# Patient Record
Sex: Female | Born: 1950 | Race: Black or African American | Hispanic: No | Marital: Single | Smoking: Never smoker
Health system: Southern US, Community
[De-identification: ages and names within clinical notes are randomized; demographics above are authoritative.]

## PROBLEM LIST (undated history)

## (undated) DIAGNOSIS — E785 Hyperlipidemia, unspecified: Secondary | ICD-10-CM

## (undated) DIAGNOSIS — I639 Cerebral infarction, unspecified: Secondary | ICD-10-CM

## (undated) DIAGNOSIS — I5189 Other ill-defined heart diseases: Secondary | ICD-10-CM

## (undated) DIAGNOSIS — I10 Essential (primary) hypertension: Secondary | ICD-10-CM

## (undated) HISTORY — DX: Essential (primary) hypertension: I10

## (undated) HISTORY — PX: EYE SURGERY: SHX253

## (undated) HISTORY — DX: Other ill-defined heart diseases: I51.89

## (undated) HISTORY — DX: Hyperlipidemia, unspecified: E78.5

## (undated) HISTORY — DX: Cerebral infarction, unspecified: I63.9

---

## 1986-11-30 DIAGNOSIS — I10 Essential (primary) hypertension: Secondary | ICD-10-CM

## 1986-11-30 HISTORY — DX: Essential (primary) hypertension: I10

## 2005-03-15 ENCOUNTER — Inpatient Hospital Stay: Payer: Self-pay | Admitting: Internal Medicine

## 2005-04-06 ENCOUNTER — Ambulatory Visit: Payer: Self-pay | Admitting: Family Medicine

## 2012-03-24 ENCOUNTER — Emergency Department: Payer: Self-pay | Admitting: Internal Medicine

## 2012-03-24 LAB — CBC
HGB: 14.2 g/dL (ref 12.0–16.0)
MCH: 29.5 pg (ref 26.0–34.0)
MCHC: 32.4 g/dL (ref 32.0–36.0)
Platelet: 170 10*3/uL (ref 150–440)
RBC: 4.83 10*6/uL (ref 3.80–5.20)
RDW: 13.9 % (ref 11.5–14.5)

## 2012-03-24 LAB — URINALYSIS, COMPLETE
Bacteria: NONE SEEN
Bilirubin,UR: NEGATIVE
Leukocyte Esterase: NEGATIVE
Nitrite: NEGATIVE
Protein: 100
RBC,UR: <1 /HPF (ref 0–5)
Specific Gravity: 1.031 (ref 1.003–1.030)
Squamous Epithelial: 2
WBC UR: 1 /HPF (ref 0–5)

## 2012-03-24 LAB — COMPREHENSIVE METABOLIC PANEL
Albumin: 4.2 g/dL (ref 3.4–5.0)
Alkaline Phosphatase: 143 U/L — ABNORMAL HIGH (ref 50–136)
Anion Gap: 12 (ref 7–16)
Bilirubin,Total: 0.7 mg/dL (ref 0.2–1.0)
Calcium, Total: 9.1 mg/dL (ref 8.5–10.1)
Chloride: 99 mmol/L (ref 98–107)
Creatinine: 0.69 mg/dL (ref 0.60–1.30)
EGFR (African American): >60
SGOT(AST): 24 U/L (ref 15–37)
SGPT (ALT): 38 U/L
Sodium: 136 mmol/L (ref 136–145)
Total Protein: 8.9 g/dL — ABNORMAL HIGH (ref 6.4–8.2)

## 2012-03-24 LAB — LIPID PANEL
Cholesterol: 355 mg/dL — ABNORMAL HIGH (ref 0–200)
HDL Cholesterol: 64 mg/dL — ABNORMAL HIGH (ref 40–60)
Ldl Cholesterol, Calc: 266 mg/dL — ABNORMAL HIGH (ref 0–100)
Triglycerides: 123 mg/dL (ref 0–200)

## 2012-03-28 ENCOUNTER — Telehealth: Payer: Self-pay | Admitting: Internal Medicine

## 2012-03-28 NOTE — Telephone Encounter (Signed)
Absolutely push 2 15 minute slots together .  You are authorized to do that anytime this situation arises.

## 2012-03-28 NOTE — Telephone Encounter (Signed)
[id number] Pt called to get new pt appointment gave her 05/30/12.  Pt wanted to know if she could be seen sooner She went to [facility] er last week she has high BP/diabetics.Pt stated she had not been taking her meds for this She needs to have cataract surgery and wanted to be seen earlier Pt stated she has no primary care

## 2012-03-28 NOTE — Telephone Encounter (Signed)
Pt aware of appointment moved to 5/2

## 2012-04-01 ENCOUNTER — Ambulatory Visit (INDEPENDENT_AMBULATORY_CARE_PROVIDER_SITE_OTHER): Payer: PRIVATE HEALTH INSURANCE | Admitting: Internal Medicine

## 2012-04-01 ENCOUNTER — Encounter: Payer: Self-pay | Admitting: Internal Medicine

## 2012-04-01 VITALS — BP 200/100 | HR 79 | Temp 98.0°F | Resp 14 | Ht 63.5 in | Wt 123.8 lb

## 2012-04-01 DIAGNOSIS — I10 Essential (primary) hypertension: Secondary | ICD-10-CM

## 2012-04-01 DIAGNOSIS — E1129 Type 2 diabetes mellitus with other diabetic kidney complication: Secondary | ICD-10-CM

## 2012-04-01 DIAGNOSIS — N058 Unspecified nephritic syndrome with other morphologic changes: Secondary | ICD-10-CM

## 2012-04-01 DIAGNOSIS — E1121 Type 2 diabetes mellitus with diabetic nephropathy: Secondary | ICD-10-CM

## 2012-04-01 DIAGNOSIS — E785 Hyperlipidemia, unspecified: Secondary | ICD-10-CM | POA: Insufficient documentation

## 2012-04-01 LAB — HEMOGLOBIN A1C: Hgb A1c MFr Bld: 14.9 % — ABNORMAL HIGH (ref 4.6–6.5)

## 2012-04-01 MED ORDER — GLIPIZIDE 5 MG PO TABS: 5.0000 mg | ORAL_TABLET | Freq: Two times a day (BID) | ORAL | Status: DC

## 2012-04-01 MED ORDER — LOSARTAN POTASSIUM 100 MG PO TABS: 100.0000 mg | ORAL_TABLET | Freq: Every day | ORAL | Status: DC

## 2012-04-01 NOTE — Assessment & Plan Note (Addendum)
Chronic, uncontrolled per patient.  Has been taking hctz since April 25.,  Adding losartan since she has DM and proteinuria.   micardis given today in office.  Return in one week for repeat bp and labs.

## 2012-04-01 NOTE — Patient Instructions (Signed)
I am starting you on losartan for your blood pressure.  Continue to take HCTZ as well.,  I am resuming glipizide for your diabetes.  Continue metformin   Return in one week for labs and bp check.

## 2012-04-01 NOTE — Assessment & Plan Note (Addendum)
Uncontrolled. At presentation she was taking only metformin . We added back glipizide  prior to obtaining her A1c in which was done today and is now available at over 14.  We will have her return next week for initiation of insulin therapy. I will start her on twice daily 7030 dose to be determined once I see her fasting sugars are to fasting and postprandial sugars which have asked her to bring with her

## 2012-04-01 NOTE — Progress Notes (Signed)
Patient ID: Julie Dominguez, female   DOB: 06/28/1971, 61 y.o.   MRN: [id number]  Patient Active Problem List  Diagnoses  . Diabetes mellitus with nephropathy  . Hypertension  . Hyperlipidemia    Subjective:  CC:   Chief Complaint  Patient presents with  . New Patient    HPI:   Julie Dominguez a 61 y.o. female who presents  as a new patient,  referred by ER after April 25 visit for hypertension.  Her elevated blood pressure was discovered during preoperative evaluation for cataract surgery by [place] .  Prior medical care over 8 months by Dr. Roland Earl, who left [facility] without referring her to another physician there.  She was under treatment for htn by him but treatment was not aggressive bc he attributed her readings to white coat syndrome.  She has been checking her home bps using a home cuff an and they have been as high as 180s.  She was previously prescribed clonidine which made her very drowsy.  She has a history of diabetes mellitus is well controlled with metformin and hyperlipidemia controlled with Zocor. She has run out of all her medications  and is currently only taking  metformin,  HCTZ and zocor  since ER visit.   she is also overdue for  screening for cervical cancer and breast cancer .  Past Medical History  Diagnosis Date  . Diabetes mellitus 2007  . Hypertension 1988    uncontrolled   . Hyperlipidemia     History reviewed. No pertinent past surgical history.       The following portions of the patient's history were reviewed and updated as appropriate: Allergies, current medications, and problem list.    Review of Systems:   12 Pt  review of systems was negative except those addressed in the HPI,     History   Social History  . Marital Status: Single    Spouse Name: N/A    Number of Children: N/A  . Years of Education: N/A   Occupational History  . Not on file.   Social History Main Topics  . Smoking status: Never Smoker   . Smokeless  tobacco: Never Used  . Alcohol Use: No  . Drug Use: No  . Sexually Active: Not on file   Other Topics Concern  . Not on file   Social History Narrative  . No narrative on file    Objective:  BP 200/100  Pulse 79  Temp(Src) 98 F (36.7 C) (Oral)  Resp 14  Ht 5' 3.5" (1.613 m)  Wt 123 lb 12 oz (56.133 kg)  BMI 21.58 kg/m2  SpO2 99%  General appearance: alert, cooperative and appears stated age Ears: normal TM's and external ear canals both ears Throat: lips, mucosa, and tongue normal; teeth and gums normal Neck: no adenopathy, no carotid bruit, supple, symmetrical, trachea midline and thyroid not enlarged, symmetric, no tenderness/mass/nodules Back: symmetric, no curvature. ROM normal. No CVA tenderness. Lungs: clear to auscultation bilaterally Heart: regular rate and rhythm, S1, S2 normal, no murmur, click, rub or gallop Abdomen: soft, non-tender; bowel sounds normal; no masses,  no organomegaly Pulses: 2+ and symmetric Skin: Skin color, texture, turgor normal. No rashes or lesions Lymph nodes: Cervical, supraclavicular, and axillary nodes normal.  Assessment and Plan:  Hypertension Chronic, uncontrolled per patient.  Has been taking hctz since April 25.,  Adding losartan since she has DM and proteinuria.   micardis given today in office.  Return in one week for  repeat bp and labs.   Diabetes mellitus with nephropathy Uncontrolled. At presentation she was taking only metformin . We added back glipizide  prior to obtaining her A1c in which was done today and is now available at over 14.  We will have her return next week for initiation of insulin therapy. I will start her on twice daily 7030 dose to be determined once I see her fasting sugars are to fasting and postprandial sugars which have asked her to bring with her  Hyperlipidemia Goal LDL of 70. Patient taking Zocor in the past. She will return for fasting lipids.    Updated Medication List Outpatient Encounter  Prescriptions as of 04/01/2012  Medication Sig Dispense Refill  . hydrochlorothiazide (HYDRODIURIL) 25 MG tablet Take 25 mg by mouth daily.      . metFORMIN (GLUCOPHAGE) 500 MG tablet Take 500 mg by mouth 2 (two) times daily with a meal.      . simvastatin (ZOCOR) 40 MG tablet Take 40 mg by mouth every evening.      Marland Kitchen glipiZIDE (GLUCOTROL) 5 MG tablet Take 1 tablet (5 mg total) by mouth 2 (two) times daily before a meal.  60 tablet  3  . losartan (COZAAR) 100 MG tablet Take 1 tablet (100 mg total) by mouth daily.  90 tablet  3     Orders Placed This Encounter  Procedures  . Hemoglobin A1c    Return in about 1 week (around 04/08/2012).

## 2012-04-03 NOTE — Assessment & Plan Note (Signed)
Goal LDL of 70. Patient taking Zocor in the past. She will return for fasting lipids.

## 2012-04-08 ENCOUNTER — Ambulatory Visit (INDEPENDENT_AMBULATORY_CARE_PROVIDER_SITE_OTHER): Payer: PRIVATE HEALTH INSURANCE | Admitting: Internal Medicine

## 2012-04-08 ENCOUNTER — Ambulatory Visit: Payer: PRIVATE HEALTH INSURANCE | Admitting: Internal Medicine

## 2012-04-08 ENCOUNTER — Encounter: Payer: Self-pay | Admitting: Internal Medicine

## 2012-04-08 VITALS — BP 118/78 | HR 78 | Temp 98.6°F | Wt 127.2 lb

## 2012-04-08 DIAGNOSIS — I10 Essential (primary) hypertension: Secondary | ICD-10-CM

## 2012-04-08 DIAGNOSIS — E1121 Type 2 diabetes mellitus with diabetic nephropathy: Secondary | ICD-10-CM

## 2012-04-08 DIAGNOSIS — E785 Hyperlipidemia, unspecified: Secondary | ICD-10-CM

## 2012-04-08 DIAGNOSIS — N058 Unspecified nephritic syndrome with other morphologic changes: Secondary | ICD-10-CM

## 2012-04-08 DIAGNOSIS — E1129 Type 2 diabetes mellitus with other diabetic kidney complication: Secondary | ICD-10-CM

## 2012-04-08 NOTE — Patient Instructions (Addendum)
We are going to start using mixed insulin twice daily, before breakfast and before dinner.  Start with 10 units each time.  DO NOT TAKE INSULIN IF YOU ARE SKIPPING THAT MEAL  Check your blood sugars before breakfast and before your evening meal supper   If your sugar is < 150 , DO NOT TAKE ANY INSULIN .Marland Kitchen  Just use your pills.  If your blood sugar is between 150 and 200,  Give yourself 4 units of insulin  If your blood sugar is between 200 and 250, give yourself 6 units of insulin  If your sugar is between 250 and 300,  Give yourself 8 units of insulin   Over 300 give your self 10 units .Marland Kitchen   I want you to read about the low glycemic index foods and diabetes.    We will set you up with a diabetes education program at the hospital.   Low carb breads will advertise themselves as 'carb control":  [employer] make s Carb Balance whole wheat that is 6 net carbs  [employer] makes a low carb flatbread [employer] makes "Sandwhich thin' that is low carb

## 2012-04-10 ENCOUNTER — Encounter: Payer: Self-pay | Admitting: Internal Medicine

## 2012-04-10 NOTE — Assessment & Plan Note (Signed)
Simvastatin was resumed for goal LDL less than 70. She will return in 6 weeks for fasting lipids.

## 2012-04-10 NOTE — Assessment & Plan Note (Signed)
Uncontrolled with hemoglobin A1c of 14.9. She was instructed on how to self administer 7030 insulin today using the Flex pens. She will start with 10 units twice daily before breakfast and evening meal. She will titrate every 3 days by 3 units for blood sugars over 150 according to the flanks failed I've written out for her.

## 2012-04-10 NOTE — Progress Notes (Signed)
Patient ID: Julie Dominguez, female   DOB: 06/28/1971, 61 y.o.   MRN: [id number]  Patient Active Problem List  Diagnoses  . Diabetes mellitus with nephropathy  . Hypertension  . Hyperlipidemia    Subjective:  CC:   Chief Complaint  Patient presents with  . Follow-up    HPI:   Julie Dominguez a 61 y.o. female who presents for one-week followup of uncontrolled diabetes hypertension and hyperlipidemia. Patient was seen as a new patient last week after having several months without medications and not passing her preoperative preoperative evaluation with Dr. Thomasene Ripple.  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 She returns today for recheck on blood pressure as well as for instruction on insulin administration as her hemoglobin A1c was returned at 14.9. She states that her blood sugars have been ranging in the 300-350 range at home. She has started her Glucophage glipizide and metformin and has had no lows. She is very apprehensive about starting insulin and brings her daughter with her.   Past Medical History  Diagnosis Date  . Diabetes mellitus 2007  . Hypertension 1988    uncontrolled   . Hyperlipidemia     No past surgical history on file.       The following portions of the patient's history were reviewed and updated as appropriate: Allergies, current medications, and problem list.    Review of Systems:   12 Pt  review of systems was negative except those addressed in the HPI,     History   Social History  . Marital Status: Single    Spouse Name: N/A    Number of Children: N/A  . Years of Education: N/A   Occupational History  . Not on file.   Social History Main Topics  . Smoking status: Never Smoker   . Smokeless tobacco: Never Used  . Alcohol Use: No  . Drug Use: No  . Sexually Active: Not on file   Other Topics Concern  .  Not on file   Social History Narrative  . No narrative on file    Objective:  BP 118/78  Pulse 78  Temp(Src) 98.6 F (37 C) (Oral)  Wt 127 lb 4 oz (57.72 kg)  SpO2 98%  General appearance: alert, cooperative and appears stated age Ears: normal TM's and external ear canals both ears Throat: lips, mucosa, and tongue normal; teeth and gums normal Neck: no adenopathy, no carotid bruit, supple, symmetrical, trachea midline and thyroid not enlarged, symmetric, no tenderness/mass/nodules Back: symmetric, no curvature. ROM normal. No CVA tenderness. Lungs: clear to auscultation bilaterally Heart: regular rate and rhythm, S1, S2 normal, no murmur, click, rub or gallop Abdomen: soft, non-tender; bowel sounds normal; no masses,  no organomegaly Pulses: 2+ and symmetric Skin: Skin color, texture, turgor normal. No rashes or lesions Lymph nodes: Cervical, supraclavicular, and axillary nodes normal.  Assessment and Plan:  Hypertension Uncontrolled at last visit, now Well controlled on current medications.  Function assessed. No changes today.   Diabetes mellitus with nephropathy Uncontrolled with hemoglobin A1c of 14.9. She was instructed on how to self administer 7030 insulin today using the Flex pens. She will start with 10 units twice daily before breakfast and evening meal. She will titrate every 3 days by 3 units for blood sugars over 150 according to the flanks failed I've written out for her.  Hyperlipidemia Simvastatin was resumed for goal LDL  less than 70. She will return in 6 weeks for fasting lipids.    Updated Medication List Outpatient Encounter Prescriptions as of 04/08/2012  Medication Sig Dispense Refill  . glipiZIDE (GLUCOTROL) 5 MG tablet Take 1 tablet (5 mg total) by mouth 2 (two) times daily before a meal.  60 tablet  3  . hydrochlorothiazide (HYDRODIURIL) 25 MG tablet Take 25 mg by mouth daily.      Marland Kitchen losartan (COZAAR) 100 MG tablet Take 1 tablet (100 mg total) by  mouth daily.  90 tablet  3  . metFORMIN (GLUCOPHAGE) 500 MG tablet Take 500 mg by mouth 2 (two) times daily with a meal.      . simvastatin (ZOCOR) 40 MG tablet Take 40 mg by mouth every evening.         No orders of the defined types were placed in this encounter.    Return in about 2 weeks (around 04/22/2012).

## 2012-04-10 NOTE — Assessment & Plan Note (Addendum)
Uncontrolled at last visit, now Well controlled on current medications.  Function assessed. No changes today.

## 2012-04-14 ENCOUNTER — Other Ambulatory Visit: Payer: Self-pay | Admitting: Internal Medicine

## 2012-04-14 MED ORDER — INSULIN PEN NEEDLE 31G X 8 MM MISC: Status: DC

## 2012-04-22 ENCOUNTER — Emergency Department: Payer: Self-pay | Admitting: Emergency Medicine

## 2012-04-22 ENCOUNTER — Telehealth: Payer: Self-pay | Admitting: Internal Medicine

## 2012-04-22 ENCOUNTER — Encounter: Payer: Self-pay | Admitting: Internal Medicine

## 2012-04-22 ENCOUNTER — Ambulatory Visit (INDEPENDENT_AMBULATORY_CARE_PROVIDER_SITE_OTHER): Payer: PRIVATE HEALTH INSURANCE | Admitting: Internal Medicine

## 2012-04-22 VITALS — BP 260/120 | HR 76 | Temp 98.5°F | Resp 14 | Wt 129.5 lb

## 2012-04-22 DIAGNOSIS — N058 Unspecified nephritic syndrome with other morphologic changes: Secondary | ICD-10-CM

## 2012-04-22 DIAGNOSIS — I10 Essential (primary) hypertension: Secondary | ICD-10-CM

## 2012-04-22 DIAGNOSIS — E1129 Type 2 diabetes mellitus with other diabetic kidney complication: Secondary | ICD-10-CM

## 2012-04-22 DIAGNOSIS — E1121 Type 2 diabetes mellitus with diabetic nephropathy: Secondary | ICD-10-CM

## 2012-04-22 LAB — BASIC METABOLIC PANEL
CO2: 26 mEq/L (ref 19–32)
Calcium: 9.6 mg/dL (ref 8.4–10.5)
Creatinine, Ser: 0.6 mg/dL (ref 0.4–1.2)
Glucose, Bld: 145 mg/dL — ABNORMAL HIGH (ref 70–99)

## 2012-04-22 LAB — COMPREHENSIVE METABOLIC PANEL
Albumin: 4 g/dL (ref 3.4–5.0)
Alkaline Phosphatase: 156 U/L — ABNORMAL HIGH (ref 50–136)
Anion Gap: 10 (ref 7–16)
Bilirubin,Total: 0.6 mg/dL (ref 0.2–1.0)
Calcium, Total: 9.5 mg/dL (ref 8.5–10.1)
Chloride: 103 mmol/L (ref 98–107)
Co2: 27 mmol/L (ref 21–32)
Creatinine: 0.7 mg/dL (ref 0.60–1.30)
Glucose: 111 mg/dL — ABNORMAL HIGH (ref 65–99)
Osmolality: 281 (ref 275–301)
Potassium: 3.5 mmol/L (ref 3.5–5.1)
SGPT (ALT): 49 U/L
Total Protein: 9.1 g/dL — ABNORMAL HIGH (ref 6.4–8.2)

## 2012-04-22 LAB — CBC
HGB: 13.2 g/dL (ref 12.0–16.0)
MCH: 29 pg (ref 26.0–34.0)
WBC: 6.1 10*3/uL (ref 3.6–11.0)

## 2012-04-22 LAB — MICROALBUMIN / CREATININE URINE RATIO: Microalb, Ur: 10.5 mg/dL — ABNORMAL HIGH (ref 0.0–1.9)

## 2012-04-22 NOTE — Patient Instructions (Addendum)
We gave you clonidine 0.1 and micardis  For your blood pressure.  I am changing you insulin dose to 5 units in the morning and 8 units in the evening.

## 2012-04-22 NOTE — Progress Notes (Signed)
Patient ID: Julie Dominguez, female   DOB: May 30, 1951, 61 y.o.   MRN: [id number]   Patient Active Problem List  Diagnoses  . Diabetes mellitus with nephropathy  . Hypertension  . Hyperlipidemia  . Accelerated hypertension    Subjective:  CC:   Chief Complaint  Patient presents with  . Follow-up    2-wk CBGs    HPI:   Julie Dominguez a 61 y.o. female who presents for follow up on chronic issues including hypertension and diabetes.  She denies headaches, vision changes, chest pain , and shortness of breath..  Has been taking losartan and hctz daily for hypertension and her last dose was one hour ago.  Repeat manual blood pressure by me is  280/120.  She was given clonidine 0.1 mg prior to my evaluation,  And a dose of micardis 80/12.5 in office at 8 15 am. For management of her uncontrolled diabetes has been taking insulin,  novolog 70/30 according to a sliding scale twice daily.  Her blood sugars have been persistently elevated in the morning but less than 150 in the afternoon.  She has tried with mjoderate success to limit the starches in her diet but continues to have 3 or 4 servings daily.   Past Medical History  Diagnosis Date  . Diabetes mellitus 2007  . Hypertension 1988    uncontrolled   . Hyperlipidemia     History reviewed. No pertinent past surgical history.       The following portions of the patient's history were reviewed and updated as appropriate: Allergies, current medications, and problem list.    Review of Systems:   12 Pt  review of systems was negative except those addressed in the HPI,     History   Social History  . Marital Status: Single    Spouse Name: N/A    Number of Children: N/A  . Years of Education: N/A   Occupational History  . Not on file.   Social History Main Topics  . Smoking status: Never Smoker   . Smokeless tobacco: Never Used  . Alcohol Use: No  . Drug Use: No  . Sexually Active: Not on file   Other Topics Concern  .  Not on file   Social History Narrative  . No narrative on file    Objective:  BP 260/120  Pulse 76  Temp(Src) 98.5 F (36.9 C) (Oral)  Resp 14  Wt 129 lb 8 oz (58.741 kg)  SpO2 99%  General appearance: alert, cooperative and appears stated age Ears: normal TM's and external ear canals both ears Throat: lips, mucosa, and tongue normal; teeth and gums normal Neck: no adenopathy, no carotid bruit, supple, symmetrical, trachea midline and thyroid not enlarged, symmetric, no tenderness/mass/nodules Back: symmetric, no curvature. ROM normal. No CVA tenderness. Lungs: clear to auscultation bilaterally Heart: regular rate and rhythm, S1, S2 normal, no murmur, click, rub or gallop Abdomen: soft, non-tender; bowel sounds normal; no masses,  no organomegaly Pulses: 2+ and symmetric Skin: Skin color, texture, turgor normal. No rashes or lesions Lymph nodes: Cervical, supraclavicular, and axillary nodes normal.  Assessment and Plan:  Accelerated hypertension Despite dosing with 0.1 mg clonidine and 80 mg micardis, her bp remains 260/120 after 45 minutes with no symptoms of hypertensive urgency.  DDX includes bilateral renal artery stenosis and she will need renal artery dopplers as an outpatient for evaluation, but I have recommended that she go to the ER for observation and possible admission.  Review of ER records  suggests that they evaluated her for one hour and sent her home with her repeat systolic was  Improved to 200.  No EKG or chest x ray was done.   Cr and potassium are normal so bilateral RAS is unlikely. Urine microalb/cr ration was also noraml.  Will start amlodipine 5 mg to add to to losartan/hctz.    Diabetes mellitus with nephropathy improved with initiation of insulin.  Doses were change today to 5 units and 8 units respectively. Recent hgba1c was . 10 .Marland Kitchen  Repeat due in 2 months     Updated Medication List Outpatient Encounter Prescriptions as of 04/22/2012  Medication Sig  Dispense Refill  . glipiZIDE (GLUCOTROL) 5 MG tablet Take 1 tablet (5 mg total) by mouth 2 (two) times daily before a meal.  60 tablet  3  . hydrochlorothiazide (HYDRODIURIL) 25 MG tablet Take 25 mg by mouth daily.      Marland Kitchen ibuprofen (ADVIL,MOTRIN) 200 MG tablet Take 200 mg by mouth as needed.      . Insulin Pen Needle ([employer] PEN NEEDLES 31G) 31G X 8 MM MISC Inject insulin two times daily  100 each  5  . losartan (COZAAR) 100 MG tablet Take 1 tablet (100 mg total) by mouth daily.  90 tablet  3  . metFORMIN (GLUCOPHAGE) 500 MG tablet Take 500 mg by mouth 2 (two) times daily with a meal.      . Multiple Vitamins-Minerals (CENTRUM SILVER ULTRA WOMENS PO) Take by mouth daily.      . simvastatin (ZOCOR) 40 MG tablet Take 40 mg by mouth every evening.      Marland Kitchen amLODipine (NORVASC) 5 MG tablet Take 1 tablet (5 mg total) by mouth daily.  90 tablet  3     Orders Placed This Encounter  Procedures  . Microalbumin / creatinine urine ratio  . Basic metabolic panel  . Ambulatory referral to Vascular Surgery    Return in about 1 week (around 04/29/2012).

## 2012-04-22 NOTE — Telephone Encounter (Signed)
Refill on diabetic test strips .

## 2012-04-25 ENCOUNTER — Encounter: Payer: Self-pay | Admitting: Internal Medicine

## 2012-04-25 DIAGNOSIS — I10 Essential (primary) hypertension: Secondary | ICD-10-CM | POA: Insufficient documentation

## 2012-04-25 MED ORDER — AMLODIPINE BESYLATE 5 MG PO TABS: 5.0000 mg | ORAL_TABLET | Freq: Every day | ORAL | Status: DC

## 2012-04-25 NOTE — Assessment & Plan Note (Addendum)
Despite dosing with 0.1 mg clonidine and 80 mg micardis, her bp remains 260/120 after 45 minutes with no symptoms of hypertensive urgency.  DDX includes bilateral renal artery stenosis and she will need renal artery dopplers as an outpatient for evaluation, but I have recommended that she go to the ER for observation and possible admission.  Review of ER records suggests that they evaluated her for one hour and sent her home with her repeat systolic was  Improved to 200.  No EKG or chest x ray was done.   Cr and potassium are normal so bilateral RAS is unlikely. Urine microalb/cr ration was also noraml.  Will start amlodipine 5 mg to add to to losartan/hctz.

## 2012-04-25 NOTE — Assessment & Plan Note (Signed)
improved with initiation of insulin.  Doses were change today to 5 units and 8 units respectively. Recent hgba1c was . 10 .Marland Kitchen  Repeat due in 2 months

## 2012-04-26 ENCOUNTER — Telehealth: Payer: Self-pay | Admitting: Internal Medicine

## 2012-04-26 NOTE — Telephone Encounter (Signed)
I have filled out my part of the referral once provider gives it back to me I will then fax.

## 2012-04-26 NOTE — Telephone Encounter (Signed)
I tried calling patient but no voicemail has been set up yet.  I will try again.

## 2012-04-26 NOTE — Telephone Encounter (Signed)
Julie Dominguez was sent to ER after Friday's visit for bp 220/120 and was sent home from ER.  Please find out if they added any medications to her bp regimen (losartan and hctz)  If not, i called in amlodipine 5 mg one tablet daily I would like her to add thi s to her other 2 and return on Friday for BP check after having taken all 3 daily in the morning for the week

## 2012-04-27 MED ORDER — BLOOD GLUCOSE TEST VI STRP: ORAL_STRIP | Status: DC

## 2012-04-27 NOTE — Telephone Encounter (Signed)
Patient notified, she stated they did not add a new medication to her regimen.  She will start the amlodipine and she has a follow up tomorrow.

## 2012-04-28 ENCOUNTER — Encounter: Payer: Self-pay | Admitting: Internal Medicine

## 2012-04-28 ENCOUNTER — Ambulatory Visit (INDEPENDENT_AMBULATORY_CARE_PROVIDER_SITE_OTHER): Payer: PRIVATE HEALTH INSURANCE | Admitting: Internal Medicine

## 2012-04-28 VITALS — BP 236/100 | HR 80 | Temp 98.1°F | Resp 16 | Wt 129.5 lb

## 2012-04-28 DIAGNOSIS — I10 Essential (primary) hypertension: Secondary | ICD-10-CM

## 2012-04-28 DIAGNOSIS — E1165 Type 2 diabetes mellitus with hyperglycemia: Secondary | ICD-10-CM

## 2012-04-28 DIAGNOSIS — IMO0002 Reserved for concepts with insufficient information to code with codable children: Secondary | ICD-10-CM

## 2012-04-28 DIAGNOSIS — IMO0001 Reserved for inherently not codable concepts without codable children: Secondary | ICD-10-CM

## 2012-04-28 MED ORDER — BLOOD GLUCOSE TEST VI STRP: ORAL_STRIP | Status: DC

## 2012-04-28 MED ORDER — SPIRONOLACTONE 50 MG PO TABS: 50.0000 mg | ORAL_TABLET | Freq: Every day | ORAL | Status: DC

## 2012-04-28 NOTE — Patient Instructions (Signed)
continue the amlodipine and the losartan. Suspend the  hctz and start spironolactone daily in the morning 50 mg .  Check your pressures daily in the late morning ,  Return in one week with your blood pressure machine.   Keep dong yuor current regimen for diabetes,

## 2012-04-28 NOTE — Progress Notes (Signed)
Patient ID: Julie Dominguez, female   DOB: [birth year], 61 y.o.   MRN: [id number] Patient Active Problem List  Diagnoses  . Hypertension  . Hyperlipidemia  . Accelerated hypertension  . Diabetes mellitus type II, uncontrolled    Subjective:  CC:   Chief Complaint  Patient presents with  . Follow-up    ER    HPI:   Julie Dominguez a 61 y.o. female who presents fof follow up on hypertension and diabetes.  One week ago she was sent to ER for asymptomatic uncontrolled htn and was discharged home without intervention.  I called in amlodipine 5 mg daily ,  Which she has added to her previous regimen,.  Home bps have been around 165 to 80.  She states that she has always had elevations due to white coat hypertension. She has no  Headache, blurred vision, chest pain or orthopnea and is tolerating her meds without side effects.  She has also been following the insulin and oral medication  regimen outlined at last visit for  her uncontrolled diabetes and has noticed an improvement in all blood sugars .     Past Medical History  Diagnosis Date  . Diabetes mellitus 2007  . Hypertension 1988    uncontrolled   . Hyperlipidemia     History reviewed. No pertinent past surgical history.       The following portions of the patient's history were reviewed and updated as appropriate: Allergies, current medications, and problem list.    Review of Systems:   12 Pt  review of systems was negative except those addressed in the HPI,     History   Social History  . Marital Status: Single    Spouse Name: N/A    Number of Children: N/A  . Years of Education: N/A   Occupational History  . Not on file.   Social History Main Topics  . Smoking status: Never Smoker   . Smokeless tobacco: Never Used  . Alcohol Use: No  . Drug Use: No  . Sexually Active: Not on file   Other Topics Concern  . Not on file   Social History Narrative  . No narrative on file    Objective:  BP 236/100   Pulse 80  Temp(Src) 98.1 F (36.7 C) (Oral)  Resp 16  Wt 129 lb 8 oz (58.741 kg)  SpO2 97%  General appearance: alert, cooperative and appears stated age Ears: normal TM's and external ear canals both ears Throat: lips, mucosa, and tongue normal; teeth and gums normal Neck: no adenopathy, no carotid bruit, supple, symmetrical, trachea midline and thyroid not enlarged, symmetric, no tenderness/mass/nodules Back: symmetric, no curvature. ROM normal. No CVA tenderness. Lungs: clear to auscultation bilaterally Heart: regular rate and rhythm, S1, S2 normal, no murmur, click, rub or gallop Abdomen: soft, non-tender; bowel sounds normal; no masses,  no organomegaly Pulses: 2+ and symmetric Skin: Skin color, texture, turgor normal. No rashes or lesions Lymph nodes: Cervical, supraclavicular, and axillary nodes normal.  Assessment and Plan:  Hypertension Again uncontrolled , despite addition of amlodipine.  Per patient, her home measurements are better.  Stopping hctz and addind spironolactone to losartan, amlodipine and metoprolol.  Return in one week with bp cuff.  She has normal renal function nad no proteinuria by recent testing.  Adding renin/alsosterone testing,  Will need repeat bmet at followup to check potassium level.   Diabetes mellitus type II, uncontrolled improving control with oral medications and sliding scale insulin .,no changes today.  Updated Medication List Outpatient Encounter Prescriptions as of 04/28/2012  Medication Sig Dispense Refill  . amLODipine (NORVASC) 5 MG tablet Take 1 tablet (5 mg total) by mouth daily.  90 tablet  3  . glipiZIDE (GLUCOTROL) 5 MG tablet Take 1 tablet (5 mg total) by mouth 2 (two) times daily before a meal.  60 tablet  3  . Glucose Blood (BLOOD GLUCOSE TEST STRIPS) STRP Test blood sugar two times daily.  100 each  6  . hydrochlorothiazide (HYDRODIURIL) 25 MG tablet Take 25 mg by mouth daily.      Marland Kitchen ibuprofen (ADVIL,MOTRIN) 200 MG tablet  Take 200 mg by mouth as needed.      . Insulin Pen Needle ([employer] PEN NEEDLES 31G) 31G X 8 MM MISC Inject insulin two times daily  100 each  5  . losartan (COZAAR) 100 MG tablet Take 1 tablet (100 mg total) by mouth daily.  90 tablet  3  . metFORMIN (GLUCOPHAGE) 500 MG tablet Take 500 mg by mouth 2 (two) times daily with a meal.      . simvastatin (ZOCOR) 40 MG tablet Take 40 mg by mouth every evening.      Marland Kitchen spironolactone (ALDACTONE) 50 MG tablet Take 1 tablet (50 mg total) by mouth daily.  30 tablet  1  . DISCONTD: Glucose Blood (BLOOD GLUCOSE TEST STRIPS) STRP Test blood sugar two times daily.  100 each  6  . DISCONTD: Multiple Vitamins-Minerals (CENTRUM SILVER ULTRA WOMENS PO) Take by mouth daily.         Orders Placed This Encounter  Procedures  . Aldosterone + renin activity w/ ratio    No Follow-up on file.

## 2012-04-29 NOTE — Telephone Encounter (Signed)
I have faxed referral.

## 2012-05-01 ENCOUNTER — Encounter: Payer: Self-pay | Admitting: Internal Medicine

## 2012-05-01 DIAGNOSIS — E1129 Type 2 diabetes mellitus with other diabetic kidney complication: Secondary | ICD-10-CM | POA: Insufficient documentation

## 2012-05-01 DIAGNOSIS — IMO0002 Reserved for concepts with insufficient information to code with codable children: Secondary | ICD-10-CM | POA: Insufficient documentation

## 2012-05-01 NOTE — Assessment & Plan Note (Signed)
improving control with oral medications and sliding scale insulin .,no changes today.

## 2012-05-01 NOTE — Assessment & Plan Note (Addendum)
Again uncontrolled , despite addition of amlodipine.  Per patient, her home measurements are better.  Stopping hctz and addind spironolactone to losartan, amlodipine and metoprolol.  Return in one week with bp cuff.  She has normal renal function nad no proteinuria by recent testing.  Adding renin/alsosterone testing,  Will need repeat bmet at followup to check potassium level.

## 2012-05-04 ENCOUNTER — Ambulatory Visit (INDEPENDENT_AMBULATORY_CARE_PROVIDER_SITE_OTHER): Payer: PRIVATE HEALTH INSURANCE | Admitting: Internal Medicine

## 2012-05-04 ENCOUNTER — Encounter: Payer: Self-pay | Admitting: Internal Medicine

## 2012-05-04 VITALS — BP 220/108 | HR 64 | Temp 97.9°F | Resp 14 | Wt 131.0 lb

## 2012-05-04 DIAGNOSIS — E1129 Type 2 diabetes mellitus with other diabetic kidney complication: Secondary | ICD-10-CM

## 2012-05-04 DIAGNOSIS — N058 Unspecified nephritic syndrome with other morphologic changes: Secondary | ICD-10-CM

## 2012-05-04 DIAGNOSIS — I10 Essential (primary) hypertension: Secondary | ICD-10-CM

## 2012-05-04 DIAGNOSIS — IMO0002 Reserved for concepts with insufficient information to code with codable children: Secondary | ICD-10-CM

## 2012-05-04 DIAGNOSIS — E1165 Type 2 diabetes mellitus with hyperglycemia: Secondary | ICD-10-CM

## 2012-05-04 DIAGNOSIS — F411 Generalized anxiety disorder: Secondary | ICD-10-CM

## 2012-05-04 DIAGNOSIS — E1121 Type 2 diabetes mellitus with diabetic nephropathy: Secondary | ICD-10-CM

## 2012-05-04 DIAGNOSIS — IMO0001 Reserved for inherently not codable concepts without codable children: Secondary | ICD-10-CM

## 2012-05-04 DIAGNOSIS — H28 Cataract in diseases classified elsewhere: Secondary | ICD-10-CM

## 2012-05-04 MED ORDER — AMLODIPINE BESYLATE 5 MG PO TABS: 10.0000 mg | ORAL_TABLET | Freq: Every day | ORAL | Status: DC

## 2012-05-04 MED ORDER — DIAZEPAM 5 MG PO TABS: 5.0000 mg | ORAL_TABLET | Freq: Two times a day (BID) | ORAL | Status: AC | PRN

## 2012-05-04 MED ORDER — GLIPIZIDE 5 MG PO TABS: ORAL_TABLET | ORAL | Status: DC

## 2012-05-04 MED ORDER — SERTRALINE HCL 50 MG PO TABS: 50.0000 mg | ORAL_TABLET | Freq: Every day | ORAL | Status: DC

## 2012-05-04 NOTE — Patient Instructions (Signed)
We are going to try suspending the insulin for 2 weeks and increasing the glipizide to  5 mg in the morning and 10 mg before dinner  Continue the metformin 1 tablet twice daily   We are adding valium for your nerves.   2mg  tablet twice daily as a temporary way to manage anxiety  We are going to start sertraline at bedtime 50 mg . Take with meal or at bedtime with other medication   I am going to increase the amlodipine to 10 mg daily in the morning

## 2012-05-04 NOTE — Progress Notes (Signed)
Patient ID: Julie Dominguez, female   DOB: December 20, 1950, 61 y.o.   MRN: [id number]  Patient Active Problem List  Diagnoses  . Hypertension  . Hyperlipidemia  . Accelerated hypertension  . Diabetes mellitus type II, uncontrolled  . GAD (generalized anxiety disorder)  . Cataract associated with another disorder    Subjective:  CC:   Chief Complaint  Patient presents with  . Follow-up    HPI:   Julie Dominguez is a 61 y.o. female who presents for follow up on uncontrolled hypertension and uncontrolled diabetes.  She has been checking her bp at home daily and home readings have been 0000000 to [id number] systolic.  She  thinks that anxiety is driving it up because the more she checks it the higher it gets. She does report difficulty  Relaxing, and frequent insomnia.  Denies any physical ore emotional abuse at home. No particular stressors other than financial. She is worried about her eyesight, which she feels is getting worse, secondary to cataracts, and is eager to have the cataract surgery that Dr. Thomasene Ripple has postoned bc of her hypertension.   Regarding her diabetes,  She has been taking glipizde and metfrormin as directed.  She has had daily use of the sliding scale using 70/30 insulin for cbgs > 150 premeal.  Morning sugars have been as low as 82,  As high as 192. Evening blood sugars have been 124 , 94, 164, 150,  113.  She has not kept a food diary.  She averages about 5 units a day of the insulin.    Past Medical History  Diagnosis Date  . Diabetes mellitus 2007  . Hypertension 1988    uncontrolled   . Hyperlipidemia     History reviewed. No pertinent past surgical history.       The following portions of the patient's history were reviewed and updated as appropriate: Allergies, current medications, and problem list.    Review of Systems:   12 Pt  review of systems was negative except those addressed in the HPI,     History   Social History  . Marital Status: Single   Spouse Name: N/A    Number of Children: N/A  . Years of Education: N/A   Occupational History  . Not on file.   Social History Main Topics  . Smoking status: Never Smoker   . Smokeless tobacco: Never Used  . Alcohol Use: No  . Drug Use: No  . Sexually Active: Not on file   Other Topics Concern  . Not on file   Social History Narrative  . No narrative on file    Objective:  BP 220/108  Pulse 64  Temp(Src) 97.9 F (36.6 C) (Oral)  Resp 14  Wt 131 lb (59.421 kg)  SpO2 97%  General appearance: alert, cooperative and appears stated age Ears: normal TM's and external ear canals both ears Throat: lips, mucosa, and tongue normal; teeth and gums normal Neck: no adenopathy, no carotid bruit, supple, symmetrical, trachea midline and thyroid not enlarged, symmetric, no tenderness/mass/nodules Back: symmetric, no curvature. ROM normal. No CVA tenderness. Lungs: clear to auscultation bilaterally Heart: regular rate and rhythm, S1, S2 normal, no murmur, click, rub or gallop Abdomen: soft, non-tender; bowel sounds normal; no masses,  no organomegaly Pulses: 2+ and symmetric Skin: Skin color, texture, turgor normal. No rashes or lesions Lymph nodes: Cervical, supraclavicular, and axillary nodes normal.  Assessment and Plan:  Diabetes mellitus type II, uncontrolled Improving control..  We are going to  try suspending the insulin for 2 weeks and increasing the glipizide to  5 mg in the morning and 10 mg before dinner.  She will continue the metformin 1 tablet twice daily   Accelerated hypertension Her bp remains elevated, without signs of end organ damage. I am going to increase the amlodipine to 10 mg daily in the morning  We are adding valium for her anxiety and sertraline at bedtime for her uncontrolled anxiety, 2 mg tablet twice daily as a temporary way to manage anxiety while the sertraline is started .      GAD (generalized anxiety disorder) Trail of sertraline 50 mg qhs and  bid valium low dose as a temporizing measure  Cataract associated with another disorder She is awaiting surgery which has been postponed due to uncontrolled HTN and DM.  She reports worsening vision today.  I am not sure what level of control Dr. Thomasene Ripple is looking for but hopefully by next week we will have things under sufficient control     Updated Medication List Outpatient Encounter Prescriptions as of 05/04/2012  Medication Sig Dispense Refill  . amLODipine (NORVASC) 5 MG tablet Take 2 tablets (10 mg total) by mouth daily.  90 tablet  0  . glipiZIDE (GLUCOTROL) 5 MG tablet 5 mg in the morning and 10 mg in the evening before meals  90 tablet  3  . Glucose Blood (BLOOD GLUCOSE TEST STRIPS) STRP Test blood sugar two times daily.  100 each  6  . ibuprofen (ADVIL,MOTRIN) 200 MG tablet Take 200 mg by mouth as needed.      . Insulin Pen Needle ([employer] PEN NEEDLES 31G) 31G X 8 MM MISC Inject insulin two times daily  100 each  5  . losartan (COZAAR) 100 MG tablet Take 1 tablet (100 mg total) by mouth daily.  90 tablet  3  . metFORMIN (GLUCOPHAGE) 500 MG tablet Take 500 mg by mouth 2 (two) times daily with a meal.      . simvastatin (ZOCOR) 40 MG tablet Take 40 mg by mouth every evening.      Marland Kitchen spironolactone (ALDACTONE) 50 MG tablet Take 1 tablet (50 mg total) by mouth daily.  30 tablet  1  . DISCONTD: amLODipine (NORVASC) 5 MG tablet Take 1 tablet (5 mg total) by mouth daily.  90 tablet  3  . DISCONTD: glipiZIDE (GLUCOTROL) 5 MG tablet Take 1 tablet (5 mg total) by mouth 2 (two) times daily before a meal.  60 tablet  3  . diazepam (VALIUM) 5 MG tablet Take 1 tablet (5 mg total) by mouth every 12 (twelve) hours as needed for anxiety.  60 tablet  1  . sertraline (ZOLOFT) 50 MG tablet Take 1 tablet (50 mg total) by mouth daily.  30 tablet  3  . DISCONTD: hydrochlorothiazide (HYDRODIURIL) 25 MG tablet Take 25 mg by mouth daily.         No orders of the defined types were placed in this  encounter.    No Follow-up on file.

## 2012-05-05 LAB — ALDOSTERONE + RENIN ACTIVITY W/ RATIO
ALDO / PRA Ratio: 3.3 Ratio (ref 0.9–28.9)
PRA LC/MS/MS: 0.91 ng/mL/h (ref 0.25–5.82)

## 2012-05-08 ENCOUNTER — Encounter: Payer: Self-pay | Admitting: Internal Medicine

## 2012-05-08 DIAGNOSIS — H28 Cataract in diseases classified elsewhere: Secondary | ICD-10-CM | POA: Insufficient documentation

## 2012-05-08 DIAGNOSIS — F411 Generalized anxiety disorder: Secondary | ICD-10-CM | POA: Insufficient documentation

## 2012-05-08 NOTE — Assessment & Plan Note (Signed)
Her bp remains elevated, without signs of end organ damage. I am going to increase the amlodipine to 10 mg daily in the morning  We are adding valium for her anxiety and sertraline at bedtime for her uncontrolled anxiety, 2 mg tablet twice daily as a temporary way to manage anxiety while the sertraline is started .

## 2012-05-08 NOTE — Assessment & Plan Note (Signed)
Trail of sertraline 50 mg qhs and bid valium low dose as a temporizing measure

## 2012-05-08 NOTE — Assessment & Plan Note (Signed)
Improving control..  We are going to try suspending the insulin for 2 weeks and increasing the glipizide to  5 mg in the morning and 10 mg before dinner.  She will continue the metformin 1 tablet twice daily

## 2012-05-08 NOTE — Assessment & Plan Note (Signed)
She is awaiting surgery which has been postponed due to uncontrolled HTN and DM.  She reports worsening vision today.  I am not sure what level of control Dr. Thomasene Ripple is looking for but hopefully by next week we will have things under sufficient control

## 2012-05-10 ENCOUNTER — Other Ambulatory Visit: Payer: PRIVATE HEALTH INSURANCE | Admitting: *Deleted

## 2012-05-10 ENCOUNTER — Telehealth: Payer: Self-pay | Admitting: Internal Medicine

## 2012-05-10 ENCOUNTER — Telehealth: Payer: Self-pay | Admitting: *Deleted

## 2012-05-10 ENCOUNTER — Other Ambulatory Visit: Payer: Self-pay | Admitting: Internal Medicine

## 2012-05-10 DIAGNOSIS — I10 Essential (primary) hypertension: Secondary | ICD-10-CM

## 2012-05-10 MED ORDER — CLONIDINE HCL 0.1 MG PO TABS: 0.1000 mg | ORAL_TABLET | Freq: Two times a day (BID) | ORAL | Status: DC

## 2012-05-10 NOTE — Telephone Encounter (Signed)
i cannot find an order for the 24 hr ambulatory blood pressure study.

## 2012-05-10 NOTE — Telephone Encounter (Signed)
SCHEDULED THE PATIENT TO HAVE AN ECHO AND AN AMBULATORY BLOOD PRESSURE MONITOR ON Thursday 6-13 @ 9:30 AT [facility].  THEY NEED YOU TO PUT AN ORDER IN EPIC FOR THESE PROCEDURES.  CANT BE DONE FROM THE REFERRAL   THANKS

## 2012-05-10 NOTE — Telephone Encounter (Signed)
Patient notified, her appt with the cardiologist is Thursday.

## 2012-05-10 NOTE — Telephone Encounter (Signed)
Patient came in today for blood pressure check. Her BP was 240/110. Pulse was 90. She feels fine and says that when she is at home it's never this high.

## 2012-05-10 NOTE — Telephone Encounter (Signed)
I will need to add clonidine 0.1 mg twice daily to her current regimen of amlodipine 10 mg daily,. spironolactone 50 mg daily,  And losartan 100 mg daily.  rx sent to pharmacy in chart.  And I am putting in a referral to cardiology for an ambulatory bp monitor.

## 2012-05-12 ENCOUNTER — Other Ambulatory Visit (HOSPITAL_COMMUNITY): Payer: Self-pay | Admitting: Internal Medicine

## 2012-05-12 ENCOUNTER — Ambulatory Visit (HOSPITAL_COMMUNITY): Payer: PRIVATE HEALTH INSURANCE | Attending: Cardiology | Admitting: Radiology

## 2012-05-12 ENCOUNTER — Encounter (INDEPENDENT_AMBULATORY_CARE_PROVIDER_SITE_OTHER): Payer: PRIVATE HEALTH INSURANCE

## 2012-05-12 DIAGNOSIS — E785 Hyperlipidemia, unspecified: Secondary | ICD-10-CM | POA: Insufficient documentation

## 2012-05-12 DIAGNOSIS — R030 Elevated blood-pressure reading, without diagnosis of hypertension: Secondary | ICD-10-CM

## 2012-05-12 DIAGNOSIS — I10 Essential (primary) hypertension: Secondary | ICD-10-CM | POA: Insufficient documentation

## 2012-05-12 DIAGNOSIS — I369 Nonrheumatic tricuspid valve disorder, unspecified: Secondary | ICD-10-CM

## 2012-05-12 DIAGNOSIS — E119 Type 2 diabetes mellitus without complications: Secondary | ICD-10-CM | POA: Insufficient documentation

## 2012-05-12 DIAGNOSIS — I359 Nonrheumatic aortic valve disorder, unspecified: Secondary | ICD-10-CM | POA: Insufficient documentation

## 2012-05-12 NOTE — Progress Notes (Signed)
Echocardiogram performed.  

## 2012-05-24 ENCOUNTER — Telehealth: Payer: Self-pay | Admitting: Internal Medicine

## 2012-05-24 DIAGNOSIS — I10 Essential (primary) hypertension: Secondary | ICD-10-CM

## 2012-05-24 DIAGNOSIS — E1121 Type 2 diabetes mellitus with diabetic nephropathy: Secondary | ICD-10-CM

## 2012-05-24 MED ORDER — AMLODIPINE BESYLATE 5 MG PO TABS: 10.0000 mg | ORAL_TABLET | Freq: Every day | ORAL | Status: DC

## 2012-05-24 MED ORDER — METFORMIN HCL 500 MG PO TABS: 500.0000 mg | ORAL_TABLET | Freq: Two times a day (BID) | ORAL | Status: DC

## 2012-05-24 MED ORDER — GLIPIZIDE 5 MG PO TABS: ORAL_TABLET | ORAL | Status: DC

## 2012-05-24 NOTE — Telephone Encounter (Signed)
Rx's sent to pharmacy.  

## 2012-05-24 NOTE — Telephone Encounter (Signed)
Refill on Glipizide 5 mg 1 in the am and 2 in the pm. Metformin 500 mg and Amlodipine 5 mg .

## 2012-05-30 ENCOUNTER — Ambulatory Visit: Payer: Self-pay | Admitting: Internal Medicine

## 2012-06-06 ENCOUNTER — Telehealth: Payer: Self-pay | Admitting: Internal Medicine

## 2012-06-06 NOTE — Telephone Encounter (Signed)
Caryl Pina,  Please confirm that you faxed this medical clearance to Eye [facility].  I have signed it

## 2012-06-06 NOTE — Telephone Encounter (Signed)
Eye doctor office needs medical clearance for surgery please fax [phone]

## 2012-06-07 NOTE — Telephone Encounter (Signed)
Yes, the medical clearance has been faxed back to [facility].

## 2012-06-10 ENCOUNTER — Other Ambulatory Visit: Payer: Self-pay | Admitting: Internal Medicine

## 2012-06-28 ENCOUNTER — Ambulatory Visit: Payer: Self-pay | Admitting: Ophthalmology

## 2012-06-28 LAB — POTASSIUM: Potassium: 4.1 mmol/L (ref 3.5–5.1)

## 2012-07-02 LAB — HM DIABETES EYE EXAM

## 2012-07-11 ENCOUNTER — Ambulatory Visit: Payer: Self-pay | Admitting: Ophthalmology

## 2012-07-17 ENCOUNTER — Other Ambulatory Visit: Payer: Self-pay | Admitting: Internal Medicine

## 2012-07-18 ENCOUNTER — Other Ambulatory Visit: Payer: Self-pay | Admitting: Internal Medicine

## 2012-07-18 MED ORDER — SIMVASTATIN 40 MG PO TABS: 40.0000 mg | ORAL_TABLET | Freq: Every evening | ORAL | Status: DC

## 2012-07-18 MED ORDER — HYDROCHLOROTHIAZIDE 25 MG PO TABS: 25.0000 mg | ORAL_TABLET | Freq: Every day | ORAL | Status: DC

## 2012-07-18 MED ORDER — METFORMIN HCL 500 MG PO TABS: 500.0000 mg | ORAL_TABLET | Freq: Two times a day (BID) | ORAL | Status: DC

## 2012-07-21 ENCOUNTER — Other Ambulatory Visit: Payer: Self-pay | Admitting: Internal Medicine

## 2012-07-21 MED ORDER — SIMVASTATIN 40 MG PO TABS: 40.0000 mg | ORAL_TABLET | Freq: Every evening | ORAL | Status: DC

## 2012-07-22 ENCOUNTER — Other Ambulatory Visit: Payer: Self-pay | Admitting: Internal Medicine

## 2012-09-01 ENCOUNTER — Ambulatory Visit: Payer: Self-pay | Admitting: Ophthalmology

## 2012-09-01 LAB — POTASSIUM: Potassium: 4 mmol/L (ref 3.5–5.1)

## 2012-09-08 ENCOUNTER — Other Ambulatory Visit: Payer: Self-pay | Admitting: Internal Medicine

## 2012-09-12 ENCOUNTER — Ambulatory Visit: Payer: Self-pay | Admitting: Ophthalmology

## 2012-10-15 ENCOUNTER — Other Ambulatory Visit: Payer: Self-pay | Admitting: Internal Medicine

## 2012-10-17 NOTE — Telephone Encounter (Signed)
Reordered patients Zoloft per Epic

## 2012-11-14 ENCOUNTER — Other Ambulatory Visit: Payer: Self-pay | Admitting: Internal Medicine

## 2012-11-15 ENCOUNTER — Other Ambulatory Visit: Payer: Self-pay

## 2012-11-15 DIAGNOSIS — E1121 Type 2 diabetes mellitus with diabetic nephropathy: Secondary | ICD-10-CM

## 2012-11-15 MED ORDER — GLIPIZIDE 5 MG PO TABS: ORAL_TABLET | ORAL | Status: DC

## 2012-11-15 NOTE — Telephone Encounter (Signed)
Glipizide 5 mg # 90 5 R sent to [facility]

## 2012-12-21 ENCOUNTER — Other Ambulatory Visit: Payer: Self-pay | Admitting: Internal Medicine

## 2012-12-21 NOTE — Telephone Encounter (Signed)
Ok to fill? Please route back to your CMA

## 2013-01-21 ENCOUNTER — Other Ambulatory Visit: Payer: Self-pay | Admitting: Internal Medicine

## 2013-01-28 ENCOUNTER — Other Ambulatory Visit: Payer: Self-pay | Admitting: Internal Medicine

## 2013-02-04 ENCOUNTER — Telehealth: Payer: Self-pay | Admitting: Internal Medicine

## 2013-02-05 ENCOUNTER — Telehealth: Payer: Self-pay | Admitting: Internal Medicine

## 2013-02-05 DIAGNOSIS — Z79899 Other long term (current) drug therapy: Secondary | ICD-10-CM

## 2013-02-05 DIAGNOSIS — E785 Hyperlipidemia, unspecified: Secondary | ICD-10-CM

## 2013-02-05 DIAGNOSIS — E119 Type 2 diabetes mellitus without complications: Secondary | ICD-10-CM

## 2013-02-05 NOTE — Telephone Encounter (Signed)
Julie Dominguez has not bee seen since June and is lkong overdue for follow up,  I refilled her amlodipine but I cannot refill any more medications  until she at least has fasting labs, which I have ordered. She needs an appt too but the labs are crucial.

## 2013-02-06 NOTE — Telephone Encounter (Signed)
Called and left a detailed message for pt to call to schdule Fasting Labs and make a follow up appt before any more refills can be filled.

## 2013-02-06 NOTE — Telephone Encounter (Signed)
Medication phoned in on 3/10.

## 2013-02-09 NOTE — Telephone Encounter (Signed)
Pt contacted and appt made for fasting labs and medication refills.

## 2013-02-22 ENCOUNTER — Other Ambulatory Visit (INDEPENDENT_AMBULATORY_CARE_PROVIDER_SITE_OTHER): Payer: PRIVATE HEALTH INSURANCE

## 2013-02-22 ENCOUNTER — Telehealth: Payer: Self-pay | Admitting: Internal Medicine

## 2013-02-22 DIAGNOSIS — E785 Hyperlipidemia, unspecified: Secondary | ICD-10-CM

## 2013-02-22 DIAGNOSIS — I10 Essential (primary) hypertension: Secondary | ICD-10-CM

## 2013-02-22 DIAGNOSIS — E1121 Type 2 diabetes mellitus with diabetic nephropathy: Secondary | ICD-10-CM

## 2013-02-22 DIAGNOSIS — Z79899 Other long term (current) drug therapy: Secondary | ICD-10-CM

## 2013-02-22 DIAGNOSIS — E119 Type 2 diabetes mellitus without complications: Secondary | ICD-10-CM

## 2013-02-22 LAB — COMPREHENSIVE METABOLIC PANEL
ALT: 24 U/L (ref 0–35)
AST: 23 U/L (ref 0–37)
Albumin: 4.3 g/dL (ref 3.5–5.2)
Alkaline Phosphatase: 108 U/L (ref 39–117)
BUN: 19 mg/dL (ref 6–23)
Potassium: 4.1 mEq/L (ref 3.5–5.1)
Sodium: 137 mEq/L (ref 135–145)
Total Protein: 8.6 g/dL — ABNORMAL HIGH (ref 6.0–8.3)

## 2013-02-22 LAB — LIPID PANEL
Cholesterol: 182 mg/dL (ref 0–200)
LDL Cholesterol: 115 mg/dL — ABNORMAL HIGH (ref 0–99)
Total CHOL/HDL Ratio: 4
VLDL: 21.2 mg/dL (ref 0.0–40.0)

## 2013-02-22 LAB — MICROALBUMIN / CREATININE URINE RATIO: Creatinine,U: 64.6 mg/dL

## 2013-02-22 NOTE — Telephone Encounter (Signed)
Pt in for labs, states appt next week to see Dr. Derrel Nip but meds will be out before then:   Losartan Potassium 100 mg tablet take 1 tablet by mouth everyday.  Glipizide 5 mg Tablet take 5 mg in the morning and 20 mg in the evening before meals.  Sertraline HCL 50 mg tablet take 1 tablet by mouth everyday. Simvastatin 40 mg tablet take 1 tablet by mouth every evening.Clonidine HCL 0.1 mg tablet take 1 tablet by mouth twice daily.  Hydrochlorothiazide 25 mg tablet take 1 tablet by mouth everyday.  [facility]

## 2013-02-23 ENCOUNTER — Other Ambulatory Visit: Payer: Self-pay | Admitting: Internal Medicine

## 2013-02-23 MED ORDER — SIMVASTATIN 40 MG PO TABS: 40.0000 mg | ORAL_TABLET | Freq: Every evening | ORAL | Status: DC

## 2013-02-23 MED ORDER — LOSARTAN POTASSIUM 100 MG PO TABS: 100.0000 mg | ORAL_TABLET | Freq: Every day | ORAL | Status: DC

## 2013-02-23 MED ORDER — HYDROCHLOROTHIAZIDE 25 MG PO TABS: ORAL_TABLET | ORAL | Status: DC

## 2013-02-23 MED ORDER — GLIPIZIDE 5 MG PO TABS: ORAL_TABLET | ORAL | Status: DC

## 2013-02-23 MED ORDER — CLONIDINE HCL 0.1 MG PO TABS: ORAL_TABLET | ORAL | Status: DC

## 2013-02-23 NOTE — Telephone Encounter (Signed)
Meds filled

## 2013-02-24 NOTE — Telephone Encounter (Signed)
Patient has not bee seen since June and has uncontrolled DM.,  Needs 30 minute appt,   I have Refill sertraline for only 30 days ; pleaes notify patient asap

## 2013-03-01 ENCOUNTER — Ambulatory Visit (INDEPENDENT_AMBULATORY_CARE_PROVIDER_SITE_OTHER): Payer: PRIVATE HEALTH INSURANCE | Admitting: Internal Medicine

## 2013-03-01 ENCOUNTER — Encounter: Payer: Self-pay | Admitting: Internal Medicine

## 2013-03-01 VITALS — BP 146/80 | HR 108 | Temp 98.1°F | Resp 20 | Wt 137.5 lb

## 2013-03-01 DIAGNOSIS — N058 Unspecified nephritic syndrome with other morphologic changes: Secondary | ICD-10-CM

## 2013-03-01 DIAGNOSIS — E1129 Type 2 diabetes mellitus with other diabetic kidney complication: Secondary | ICD-10-CM

## 2013-03-01 DIAGNOSIS — E1121 Type 2 diabetes mellitus with diabetic nephropathy: Secondary | ICD-10-CM

## 2013-03-01 DIAGNOSIS — Z1211 Encounter for screening for malignant neoplasm of colon: Secondary | ICD-10-CM

## 2013-03-01 DIAGNOSIS — IMO0002 Reserved for concepts with insufficient information to code with codable children: Secondary | ICD-10-CM

## 2013-03-01 DIAGNOSIS — Z1239 Encounter for other screening for malignant neoplasm of breast: Secondary | ICD-10-CM

## 2013-03-01 DIAGNOSIS — E1165 Type 2 diabetes mellitus with hyperglycemia: Secondary | ICD-10-CM

## 2013-03-01 DIAGNOSIS — H28 Cataract in diseases classified elsewhere: Secondary | ICD-10-CM

## 2013-03-01 DIAGNOSIS — I10 Essential (primary) hypertension: Secondary | ICD-10-CM

## 2013-03-01 DIAGNOSIS — Z124 Encounter for screening for malignant neoplasm of cervix: Secondary | ICD-10-CM

## 2013-03-01 DIAGNOSIS — IMO0001 Reserved for inherently not codable concepts without codable children: Secondary | ICD-10-CM

## 2013-03-01 MED ORDER — INSULIN DETEMIR 100 UNIT/ML FLEXPEN: SUBCUTANEOUS | Status: DC

## 2013-03-01 MED ORDER — AMLODIPINE BESYLATE 10 MG PO TABS: 10.0000 mg | ORAL_TABLET | Freq: Every day | ORAL | Status: DC

## 2013-03-01 MED ORDER — INSULIN PEN NEEDLE 32G X 6 MM MISC: Status: DC

## 2013-03-01 MED ORDER — INSULIN DETEMIR 100 UNIT/ML ~~LOC~~ SOLN: 20.0000 [IU] | Freq: Every day | SUBCUTANEOUS | Status: DC

## 2013-03-01 MED ORDER — GLIPIZIDE 10 MG PO TABS: 10.0000 mg | ORAL_TABLET | Freq: Two times a day (BID) | ORAL | Status: DC

## 2013-03-01 MED ORDER — METFORMIN HCL 1000 MG PO TABS: 1000.0000 mg | ORAL_TABLET | Freq: Two times a day (BID) | ORAL | Status: DC

## 2013-03-01 NOTE — Progress Notes (Signed)
Patient ID: Julie Dominguez, female   DOB: [birth year], 62 y.o.   MRN: [id number] Patient Active Problem List  Diagnosis  . Hypertension  . Hyperlipidemia  . Accelerated hypertension  . Diabetes mellitus type II, uncontrolled  . GAD (generalized anxiety disorder)  . Cataract associated with another disorder  . Screening for breast cancer  . Screening for colon cancer  . Screening for cervical cancer    Subjective:  CC:   Chief Complaint  Patient presents with  . Follow-up    HPI:   Julie Dominguez a 62 y.o. female who presents for follow up on uncontrolled diabetes and severe hypertension.  She has been lost to follow up and was last seen 9 months ago.  In the interim she had bilateral cataracts removed by Dr.  Thomasene Ripple. She has no complaints today   Past Medical History  Diagnosis Date  . Diabetes mellitus 2007  . Hypertension 1988    uncontrolled   . Hyperlipidemia     Past Surgical History  Procedure Laterality Date  . Eye surgery Bilateral August 2013    catraract with lens implant dingledein        The following portions of the patient's history were reviewed and updated as appropriate: Allergies, current medications, and problem list.    Review of Systems:   Patient denies headache, fevers, malaise, unintentional weight loss, skin rash, eye pain, sinus congestion and sinus pain, sore throat, dysphagia,  hemoptysis , cough, dyspnea, wheezing, chest pain, palpitations, orthopnea, edema, abdominal pain, nausea, melena, diarrhea, constipation, flank pain, dysuria, hematuria, urinary  Frequency, nocturia, numbness, tingling, seizures,  Focal weakness, Loss of consciousness,  Tremor, insomnia, depression, anxiety, and suicidal ideation.     History   Social History  . Marital Status: Single    Spouse Name: N/A    Number of Children: N/A  . Years of Education: N/A   Occupational History  . Not on file.   Social History Main Topics  . Smoking status: Never  Smoker   . Smokeless tobacco: Never Used  . Alcohol Use: No  . Drug Use: No  . Sexually Active: Not on file   Other Topics Concern  . Not on file   Social History Narrative  . No narrative on file    Objective:  BP 146/80  Pulse 108  Temp(Src) 98.1 F (36.7 C) (Oral)  Resp 20  Wt 137 lb 8 oz (62.37 kg)  BMI 23.97 kg/m2  SpO2 98%  General appearance: alert, cooperative and appears stated age Ears: normal TM's and external ear canals both ears Throat: lips, mucosa, and tongue normal; teeth and gums normal Neck: no adenopathy, no carotid bruit, supple, symmetrical, trachea midline and thyroid not enlarged, symmetric, no tenderness/mass/nodules Back: symmetric, no curvature. ROM normal. No CVA tenderness. Lungs: clear to auscultation bilaterally Heart: regular rate and rhythm, S1, S2 normal, no murmur, click, rub or gallop Abdomen: soft, non-tender; bowel sounds normal; no masses,  no organomegaly Pulses: 2+ and symmetric Skin: Skin color, texture, turgor normal. No rashes or lesions Lymph nodes: Cervical, supraclavicular, and axillary nodes normal. Foot exam:  Nails are well trimmed,  No callouses,  Sensation intact to microfilament  Assessment and Plan:  Diabetes mellitus type II, uncontrolled With microscopic proteinuria. hgba1c is still very high at 9.9, fasting glucose 235.  Increasing metformin to 1000 mg bid , glipizde to 10 mg bid and adding Lantus 20 units daily Levemir.  Refewrral to [facility]'s diabetes education seminar.   Up to  date on eye exams.  Return in one month .  Cataract associated with another disorder Removed by Dingledein.   Hypertension Improved with management of anxiety, currently on 5 medications.  Renal artery stenosis ruled out last July by [facility].  No signs or symptoms of OSA.  No evidence of hyperaldosteronism by prior serologic testing.   Screening for colon cancer No prior screening .  Will refer for colonoscopy  Screening for  cervical cancer She has not had a PAP in years. Will offer PAP at next visit.    Updated Medication List Outpatient Encounter Prescriptions as of 03/01/2013  Medication Sig Dispense Refill  . amLODipine (NORVASC) 10 MG tablet Take 1 tablet (10 mg total) by mouth daily.  90 tablet  3  . cloNIDine (CATAPRES) 0.1 MG tablet TAKE 1 TABLET BY MOUTH TWICE A DAY  60 tablet  3  . glipiZIDE (GLUCOTROL) 10 MG tablet Take 1 tablet (10 mg total) by mouth 2 (two) times daily before a meal. 5 mg in the morning and 10 mg in the evening before meals  180 tablet  3  . Glucose Blood (BLOOD GLUCOSE TEST STRIPS) STRP Test blood sugar two times daily.  100 each  6  . hydrochlorothiazide (HYDRODIURIL) 25 MG tablet TAKE 1 TABLET BY MOUTH EVERY DAY  30 tablet  5  . ibuprofen (ADVIL,MOTRIN) 200 MG tablet Take 200 mg by mouth as needed.      . Insulin Pen Needle ([employer] PEN NEEDLES 31G) 31G X 8 MM MISC Inject insulin two times daily  100 each  5  . losartan (COZAAR) 100 MG tablet Take 1 tablet (100 mg total) by mouth daily.  90 tablet  3  . metFORMIN (GLUCOPHAGE) 500 MG tablet TAKE 1 TABLET BY MOUTH TWICE A DAY WITH MEALS  60 tablet  5  . sertraline (ZOLOFT) 50 MG tablet TAKE 1 TABLET BY MOUTH EVERY DAY  30 tablet  0  . simvastatin (ZOCOR) 40 MG tablet Take 1 tablet (40 mg total) by mouth every evening.  30 tablet  6  . spironolactone (ALDACTONE) 50 MG tablet TAKE 1 TABLET (50 MG TOTAL) BY MOUTH DAILY.  30 tablet  3  . [DISCONTINUED] amLODipine (NORVASC) 5 MG tablet TAKE 2 TABLETS BY MOUTH EVERY DAY  90 tablet  5  . [DISCONTINUED] glipiZIDE (GLUCOTROL) 5 MG tablet 5 mg in the morning and 10 mg in the evening before meals  90 tablet  5  . insulin detemir (LEVEMIR) 100 UNIT/ML injection Inject 0.2 mLs (20 Units total) into the skin at bedtime.  10 mL  12  . insulin detemir (LEVEMIR) 100 unit/ml SOLN Pt given a sample flexpen in the office today as well as 20units.  3 mL  0  . Insulin Pen Needle 32G X 6 MM MISC Use  Daily  with Levemir insulin  100 each  3  . metFORMIN (GLUCOPHAGE) 1000 MG tablet Take 1 tablet (1,000 mg total) by mouth 2 (two) times daily with a meal.  180 tablet  3   No facility-administered encounter medications on file as of 03/01/2013.     Orders Placed This Encounter  Procedures  . MM Digital Screening  . Ambulatory referral to diabetic education  . [facility] DIABETES EYE EXAM    No Follow-up on file.

## 2013-03-01 NOTE — Patient Instructions (Addendum)
Your diabetes is still uncontrolled  Increase metformin to 1000 mg twice daily  Increase glipizide to 10 mg tiwce daily before breakfast and supper (evening meal)  Adding insulin , Levemir 20 units daily in the evening  Check you sugars twice daily ,  Before breakfast and before dinner   Bring your blood sugar log to your next visit    I am referring you to [facility] for their diabetes education classes  return in two weeks for  n office visit and blood pressure check

## 2013-03-02 ENCOUNTER — Telehealth: Payer: Self-pay | Admitting: Internal Medicine

## 2013-03-02 ENCOUNTER — Encounter: Payer: Self-pay | Admitting: Internal Medicine

## 2013-03-02 DIAGNOSIS — Z1239 Encounter for other screening for malignant neoplasm of breast: Secondary | ICD-10-CM | POA: Insufficient documentation

## 2013-03-02 DIAGNOSIS — Z1211 Encounter for screening for malignant neoplasm of colon: Secondary | ICD-10-CM | POA: Insufficient documentation

## 2013-03-02 DIAGNOSIS — Z124 Encounter for screening for malignant neoplasm of cervix: Secondary | ICD-10-CM | POA: Insufficient documentation

## 2013-03-02 NOTE — Assessment & Plan Note (Signed)
She has not had a PAP in years. Will offer PAP at next visit.

## 2013-03-02 NOTE — Assessment & Plan Note (Signed)
No prior screening .  Will refer for colonoscopy

## 2013-03-02 NOTE — Assessment & Plan Note (Addendum)
Improved with management of anxiety, currently on 5 medications.  Renal artery stenosis ruled out last July by [facility].  No signs or symptoms of OSA.  No evidence of hyperaldosteronism by prior serologic testing.

## 2013-03-02 NOTE — Telephone Encounter (Signed)
Please make sure ms Rosenberg has a one month follow up but i noticed that she has not had mammogram,  PAP smear or colon ca screening so we need her next visit to be an annual PE. I have ordered her mammogram.

## 2013-03-02 NOTE — Assessment & Plan Note (Addendum)
With microscopic proteinuria. hgba1c is still very high at 9.9, fasting glucose 235.  Increasing metformin to 1000 mg bid , glipizde to 10 mg bid and adding Lantus 20 units daily Levemir.  Up tod date on eye exams.  Return in one month .

## 2013-03-02 NOTE — Assessment & Plan Note (Signed)
Removed by Dingledein.

## 2013-03-03 NOTE — Telephone Encounter (Signed)
Pt has an appt for 4/22.

## 2013-03-21 ENCOUNTER — Ambulatory Visit: Payer: PRIVATE HEALTH INSURANCE | Admitting: Internal Medicine

## 2013-04-03 ENCOUNTER — Ambulatory Visit (INDEPENDENT_AMBULATORY_CARE_PROVIDER_SITE_OTHER): Payer: PRIVATE HEALTH INSURANCE | Admitting: Internal Medicine

## 2013-04-03 ENCOUNTER — Encounter: Payer: Self-pay | Admitting: Internal Medicine

## 2013-04-03 VITALS — BP 150/80 | HR 71 | Temp 98.5°F | Resp 16 | Wt 137.5 lb

## 2013-04-03 DIAGNOSIS — IMO0001 Reserved for inherently not codable concepts without codable children: Secondary | ICD-10-CM

## 2013-04-03 DIAGNOSIS — IMO0002 Reserved for concepts with insufficient information to code with codable children: Secondary | ICD-10-CM

## 2013-04-03 DIAGNOSIS — E1165 Type 2 diabetes mellitus with hyperglycemia: Secondary | ICD-10-CM

## 2013-04-03 MED ORDER — INSULIN DETEMIR 100 UNIT/ML FLEXPEN: 20.0000 [IU] | PEN_INJECTOR | Freq: Every day | SUBCUTANEOUS | Status: DC

## 2013-04-03 NOTE — Progress Notes (Signed)
Patient ID: Julie Dominguez, female   DOB: [birth year], 62 y.o.   MRN: [id number]   Patient Active Problem List   Diagnosis Date Noted  . Screening for breast cancer 03/02/2013  . Screening for colon cancer 03/02/2013  . Screening for cervical cancer 03/02/2013  . GAD (generalized anxiety disorder) 05/08/2012  . Cataract associated with another disorder 05/08/2012  . Diabetes mellitus type II, uncontrolled 05/01/2012  . Accelerated hypertension 04/25/2012  . Hypertension   . Hyperlipidemia     Subjective:  CC:   Chief Complaint  Patient presents with  . Follow-up    HPI:   Julie Dominguez a 62 y.o. female who presents for follow up on uncontrolled diabetes. Last seen one month ago and metformin was increased,  glipizde increased and basal insulin added after being lost to folow up for 9 months.  She was referred to  [place] for diabetes seminar after labs were checked in March but did not go.    She is checking blood sugars as requested twice daily,  fastings and pre dinner ,  All are < 150 for the past  Week.  A few in the mid 70's before dinner and felt shaky  but can be explained by early administration of glipizide.  She continues to use lantus 20 unit daily since last visit and her out of pocket expenses for all meds were $70 to 80 which included the pen    Past Medical History  Diagnosis Date  . Diabetes mellitus 2007  . Hypertension 1988    uncontrolled   . Hyperlipidemia     Past Surgical History  Procedure Laterality Date  . Eye surgery Bilateral August 2013    catraract with lens implant dingledein        The following portions of the patient's history were reviewed and updated as appropriate: Allergies, current medications, and problem list.    Review of Systems:   12 Pt  review of systems was negative except those addressed in the HPI,     History   Social History  . Marital Status: Single    Spouse Name: N/A    Number of Children: N/A   . Years of Education: N/A   Occupational History  . Not on file.   Social History Main Topics  . Smoking status: Never Smoker   . Smokeless tobacco: Never Used  . Alcohol Use: No  . Drug Use: No  . Sexually Active: Not on file   Other Topics Concern  . Not on file   Social History Narrative  . No narrative on file    Objective:  BP 150/80  Pulse 71  Temp(Src) 98.5 F (36.9 C) (Oral)  Resp 16  Wt 137 lb 8 oz (62.37 kg)  BMI 23.97 kg/m2  SpO2 98%  General appearance: alert, cooperative and appears stated age Ears: normal TM's and external ear canals both ears Throat: lips, mucosa, and tongue normal; teeth and gums normal Neck: no adenopathy, no carotid bruit, supple, symmetrical, trachea midline and thyroid not enlarged, symmetric, no tenderness/mass/nodules Back: symmetric, no curvature. ROM normal. No CVA tenderness. Lungs: clear to auscultation bilaterally Heart: regular rate and rhythm, S1, S2 normal, no murmur, click, rub or gallop Abdomen: soft, non-tender; bowel sounds normal; no masses,  no organomegaly Pulses: 2+ and symmetric Skin: Skin color, texture, turgor normal. No rashes or lesions Lymph nodes: Cervical, supraclavicular, and axillary nodes normal.Foot exam:  Nails are well trimmed,  No callouses,  Sensation intact to microfilament  Assessment and Plan:  Diabetes mellitus type II, uncontrolled Patient was lost to follow up with initial a1c over 14 one year ago. A1c was 9.9 in March and Improvement noted upon review of past week's labs. No changes today except reviewed proper timing of glipizide. She is on a statin,  ARB. And asa reminded.   A total of 25 minutes was spent with patient more than half of which was spent in counseling, reviewing records from other prviders and coordination of care.  Updated Medication List Outpatient Encounter Prescriptions as of 04/03/2013  Medication Sig Dispense Refill  . amLODipine (NORVASC) 10 MG tablet Take 1 tablet  (10 mg total) by mouth daily.  90 tablet  3  . cloNIDine (CATAPRES) 0.1 MG tablet TAKE 1 TABLET BY MOUTH TWICE A DAY  60 tablet  3  . glipiZIDE (GLUCOTROL) 10 MG tablet Take 1 tablet (10 mg total) by mouth 2 (two) times daily before a meal. 5 mg in the morning and 10 mg in the evening before meals  180 tablet  3  . Glucose Blood (BLOOD GLUCOSE TEST STRIPS) STRP Test blood sugar two times daily.  100 each  6  . hydrochlorothiazide (HYDRODIURIL) 25 MG tablet TAKE 1 TABLET BY MOUTH EVERY DAY  30 tablet  5  . ibuprofen (ADVIL,MOTRIN) 200 MG tablet Take 200 mg by mouth as needed.      . Insulin Pen Needle ([employer] PEN NEEDLES 31G) 31G X 8 MM MISC Inject insulin two times daily  100 each  5  . losartan (COZAAR) 100 MG tablet Take 1 tablet (100 mg total) by mouth daily.  90 tablet  3  . metFORMIN (GLUCOPHAGE) 500 MG tablet TAKE 1 TABLET BY MOUTH TWICE A DAY WITH MEALS  60 tablet  5  . sertraline (ZOLOFT) 50 MG tablet TAKE 1 TABLET BY MOUTH EVERY DAY  30 tablet  0  . simvastatin (ZOCOR) 40 MG tablet Take 1 tablet (40 mg total) by mouth every evening.  30 tablet  6  . spironolactone (ALDACTONE) 50 MG tablet TAKE 1 TABLET (50 MG TOTAL) BY MOUTH DAILY.  30 tablet  3  . [DISCONTINUED] insulin detemir (LEVEMIR) 100 UNIT/ML injection Inject 0.2 mLs (20 Units total) into the skin at bedtime.  10 mL  12  . [DISCONTINUED] insulin detemir (LEVEMIR) 100 unit/ml SOLN Pt given a sample flexpen in the office today as well as 20units.  3 mL  0  . Insulin Detemir (LEVEMIR FLEXPEN) 100 UNIT/ML SOPN Inject 20 Units into the skin daily.  15 mL  11  . Insulin Pen Needle 32G X 6 MM MISC Use  Daily with Levemir insulin  100 each  3  . metFORMIN (GLUCOPHAGE) 1000 MG tablet Take 1 tablet (1,000 mg total) by mouth 2 (two) times daily with a meal.  180 tablet  3   No facility-administered encounter medications on file as of 04/03/2013.     No orders of the defined types were placed in this encounter.    Return in about 2 months  (around 06/03/2013).

## 2013-04-03 NOTE — Patient Instructions (Addendum)
CONTINUE LEVEMIR 20 Unit DAILY,.  A BOX OF 5 PENS WILL LAST 75 DAYS NOT 90 DAYS   Change the glipizide to right before dinner,  Not 30 minutes prior  Check blood sugars 2 hours after one of your meals and whenever you feel it is low

## 2013-04-09 ENCOUNTER — Encounter: Payer: Self-pay | Admitting: Internal Medicine

## 2013-04-09 NOTE — Assessment & Plan Note (Addendum)
Patient was lost to follow up with initial a1c over 14 one year ago. A1c was 9.9 in March and Improvement noted upon review of past week's labs. No changes today except reviewed proper timing of glipizide. She is on a statin,  ARB. And asa reminded.

## 2013-04-22 ENCOUNTER — Other Ambulatory Visit: Payer: Self-pay | Admitting: Internal Medicine

## 2013-04-29 ENCOUNTER — Other Ambulatory Visit: Payer: Self-pay | Admitting: Internal Medicine

## 2013-05-17 ENCOUNTER — Other Ambulatory Visit: Payer: Self-pay | Admitting: Internal Medicine

## 2013-05-25 ENCOUNTER — Other Ambulatory Visit: Payer: PRIVATE HEALTH INSURANCE

## 2013-05-31 ENCOUNTER — Other Ambulatory Visit: Payer: Self-pay | Admitting: Internal Medicine

## 2013-06-05 ENCOUNTER — Encounter: Payer: PRIVATE HEALTH INSURANCE | Admitting: Internal Medicine

## 2013-06-24 ENCOUNTER — Other Ambulatory Visit: Payer: Self-pay | Admitting: Internal Medicine

## 2013-06-26 ENCOUNTER — Other Ambulatory Visit: Payer: Self-pay | Admitting: Internal Medicine

## 2013-06-26 NOTE — Telephone Encounter (Signed)
Pt has refills available, confirmed by pharmacy.

## 2013-06-29 ENCOUNTER — Other Ambulatory Visit: Payer: Self-pay | Admitting: Internal Medicine

## 2013-06-29 NOTE — Telephone Encounter (Signed)
Refill for Glipizide completed 06/26/13 confirmed receipt

## 2013-07-20 ENCOUNTER — Other Ambulatory Visit: Payer: Self-pay | Admitting: Internal Medicine

## 2013-09-03 ENCOUNTER — Other Ambulatory Visit: Payer: Self-pay | Admitting: Internal Medicine

## 2013-09-04 ENCOUNTER — Other Ambulatory Visit: Payer: Self-pay | Admitting: Internal Medicine

## 2013-09-04 NOTE — Telephone Encounter (Signed)
03/01/13 note shows Metformin increased to 1000 mg bid

## 2013-09-05 ENCOUNTER — Other Ambulatory Visit: Payer: Self-pay | Admitting: Internal Medicine

## 2013-09-05 NOTE — Telephone Encounter (Signed)
03/01/13 note shows Metformin increased to 1000 mg bid

## 2013-09-08 ENCOUNTER — Other Ambulatory Visit: Payer: Self-pay | Admitting: Internal Medicine

## 2013-09-24 ENCOUNTER — Other Ambulatory Visit: Payer: Self-pay | Admitting: Internal Medicine

## 2013-09-26 ENCOUNTER — Other Ambulatory Visit: Payer: Self-pay | Admitting: Internal Medicine

## 2013-11-02 ENCOUNTER — Other Ambulatory Visit: Payer: Self-pay | Admitting: Internal Medicine

## 2013-11-02 NOTE — Telephone Encounter (Signed)
Last visit 04/03/13, last labs 02/20/13, refill or labs and/or visit first?

## 2013-11-03 ENCOUNTER — Other Ambulatory Visit: Payer: Self-pay | Admitting: Internal Medicine

## 2013-11-06 ENCOUNTER — Other Ambulatory Visit: Payer: Self-pay | Admitting: Internal Medicine

## 2013-11-12 ENCOUNTER — Other Ambulatory Visit: Payer: Self-pay | Admitting: Internal Medicine

## 2013-11-20 ENCOUNTER — Other Ambulatory Visit: Payer: Self-pay | Admitting: *Deleted

## 2013-11-20 MED ORDER — SERTRALINE HCL 50 MG PO TABS: ORAL_TABLET | ORAL | Status: DC

## 2013-11-28 ENCOUNTER — Other Ambulatory Visit: Payer: Self-pay | Admitting: *Deleted

## 2013-11-28 NOTE — Telephone Encounter (Signed)
Last seen in May 2014 and has cancelled last few appointments. Please advise on refill?

## 2013-12-01 ENCOUNTER — Other Ambulatory Visit: Payer: Self-pay | Admitting: *Deleted

## 2013-12-01 ENCOUNTER — Telehealth: Payer: Self-pay | Admitting: Internal Medicine

## 2013-12-01 MED ORDER — GLIPIZIDE 5 MG PO TABS: ORAL_TABLET | ORAL | Status: DC

## 2013-12-01 NOTE — Telephone Encounter (Signed)
RX SENT ELECTRONICALLY

## 2013-12-01 NOTE — Telephone Encounter (Signed)
glipiZIDE (GLUCOTROL) 5 MG tablet

## 2013-12-04 ENCOUNTER — Other Ambulatory Visit: Payer: Self-pay | Admitting: *Deleted

## 2013-12-04 MED ORDER — SPIRONOLACTONE 50 MG PO TABS: ORAL_TABLET | ORAL | Status: DC

## 2013-12-04 NOTE — Telephone Encounter (Signed)
Rx called to pharmacy for Glipizide since it was printed.

## 2014-01-03 ENCOUNTER — Other Ambulatory Visit: Payer: Self-pay | Admitting: *Deleted

## 2014-01-04 ENCOUNTER — Telehealth: Payer: Self-pay | Admitting: *Deleted

## 2014-01-04 ENCOUNTER — Other Ambulatory Visit: Payer: Self-pay | Admitting: *Deleted

## 2014-01-04 ENCOUNTER — Other Ambulatory Visit: Payer: Self-pay | Admitting: Internal Medicine

## 2014-01-04 DIAGNOSIS — E1121 Type 2 diabetes mellitus with diabetic nephropathy: Secondary | ICD-10-CM

## 2014-01-04 MED ORDER — SPIRONOLACTONE 50 MG PO TABS: ORAL_TABLET | ORAL | Status: DC

## 2014-01-04 MED ORDER — GLIPIZIDE 10 MG PO TABS: 10.0000 mg | ORAL_TABLET | Freq: Two times a day (BID) | ORAL | Status: DC

## 2014-01-04 NOTE — Telephone Encounter (Signed)
Patient called and stated she cannot come in to office this Month but is willing to make an appointment for next month if she can receive enough Glipizide to last until visit patient financially cannot come. Please advise?

## 2014-01-04 NOTE — Telephone Encounter (Signed)
Ok to refill,  Authorized in epic 

## 2014-01-04 NOTE — Telephone Encounter (Signed)
Appt scheduled 01/31/14

## 2014-01-04 NOTE — Telephone Encounter (Signed)
Medication refilled and patient notified appointment made for 02/01/14

## 2014-01-17 ENCOUNTER — Other Ambulatory Visit: Payer: Self-pay | Admitting: *Deleted

## 2014-01-18 MED ORDER — SERTRALINE HCL 50 MG PO TABS: ORAL_TABLET | ORAL | Status: DC

## 2014-01-18 MED ORDER — SIMVASTATIN 40 MG PO TABS: 40.0000 mg | ORAL_TABLET | Freq: Every evening | ORAL | Status: DC

## 2014-01-18 NOTE — Telephone Encounter (Signed)
Appt 01/31/14

## 2014-01-31 ENCOUNTER — Other Ambulatory Visit: Payer: Self-pay | Admitting: Internal Medicine

## 2014-01-31 ENCOUNTER — Ambulatory Visit (INDEPENDENT_AMBULATORY_CARE_PROVIDER_SITE_OTHER): Payer: PRIVATE HEALTH INSURANCE | Admitting: Internal Medicine

## 2014-01-31 ENCOUNTER — Encounter: Payer: Self-pay | Admitting: Internal Medicine

## 2014-01-31 ENCOUNTER — Encounter: Payer: Self-pay | Admitting: General Surgery

## 2014-01-31 VITALS — BP 178/90 | HR 88 | Temp 99.0°F | Resp 16 | Wt 140.0 lb

## 2014-01-31 DIAGNOSIS — E1165 Type 2 diabetes mellitus with hyperglycemia: Secondary | ICD-10-CM

## 2014-01-31 DIAGNOSIS — Z9119 Patient's noncompliance with other medical treatment and regimen: Secondary | ICD-10-CM

## 2014-01-31 DIAGNOSIS — I10 Essential (primary) hypertension: Secondary | ICD-10-CM

## 2014-01-31 DIAGNOSIS — IMO0002 Reserved for concepts with insufficient information to code with codable children: Secondary | ICD-10-CM

## 2014-01-31 DIAGNOSIS — Z1211 Encounter for screening for malignant neoplasm of colon: Secondary | ICD-10-CM

## 2014-01-31 DIAGNOSIS — E785 Hyperlipidemia, unspecified: Secondary | ICD-10-CM

## 2014-01-31 DIAGNOSIS — Z23 Encounter for immunization: Secondary | ICD-10-CM

## 2014-01-31 DIAGNOSIS — Z91199 Patient's noncompliance with other medical treatment and regimen due to unspecified reason: Secondary | ICD-10-CM

## 2014-01-31 DIAGNOSIS — IMO0001 Reserved for inherently not codable concepts without codable children: Secondary | ICD-10-CM

## 2014-01-31 DIAGNOSIS — Z1239 Encounter for other screening for malignant neoplasm of breast: Secondary | ICD-10-CM

## 2014-01-31 LAB — LDL CHOLESTEROL, DIRECT: Direct LDL: 123 mg/dL

## 2014-01-31 LAB — COMPREHENSIVE METABOLIC PANEL
ALBUMIN: 4.1 g/dL (ref 3.5–5.2)
ALT: 22 U/L (ref 0–35)
AST: 16 U/L (ref 0–37)
Alkaline Phosphatase: 114 U/L (ref 39–117)
BUN: 20 mg/dL (ref 6–23)
CO2: 24 meq/L (ref 19–32)
Calcium: 9.4 mg/dL (ref 8.4–10.5)
Chloride: 102 mEq/L (ref 96–112)
Creatinine, Ser: 1 mg/dL (ref 0.4–1.2)
GFR: 75.11 mL/min (ref >60.00)
GLUCOSE: 166 mg/dL — AB (ref 70–99)
POTASSIUM: 4.5 meq/L (ref 3.5–5.1)
SODIUM: 136 meq/L (ref 135–145)
TOTAL PROTEIN: 7.7 g/dL (ref 6.0–8.3)
Total Bilirubin: 0.8 mg/dL (ref 0.3–1.2)

## 2014-01-31 LAB — HEMOGLOBIN A1C: Hgb A1c MFr Bld: 10 % — ABNORMAL HIGH (ref 4.6–6.5)

## 2014-01-31 LAB — HM DIABETES FOOT EXAM: HM Diabetic Foot Exam: NORMAL

## 2014-01-31 MED ORDER — CLONIDINE HCL 0.2 MG PO TABS: ORAL_TABLET | ORAL | Status: DC

## 2014-01-31 NOTE — Patient Instructions (Addendum)
I am increasing your clonidine to 0.2 mg.  PLEASE TAKE IT TWICE DAILY ,  THIS IS A BLOOD PRESSURE MEDICAtTION  Please return in one month with your blood sugar log.   I have given you the form.  You need to check a fasting and  A 2 hour post prandials (after a meal) along with a brief description of the meal content related to the particular blood sugars.   You are overdue for your eye appt by 1.5 years!!  appt to be set up with Dr Thomasene Ripple  Julie Dominguez never had your mammogram last year either, so I am rescheduling it today,.   You received the pneumonia vaccine.  We will give you the TDaP vaccine next month.   Diabetes and Standards of Medical Care   Diabetes is complicated. You may find that your diabetes team includes a dietitian, nurse, diabetes educator, eye doctor, and more. To help everyone know what is going on and to help you get the care you deserve, the following schedule of care was developed to help keep you on track. Below are the tests, exams, vaccines, medicines, education, and plans you will need. HbA1c test This test shows how well you have controlled your glucose over the past 2 3 months. It is used to see if your diabetes management plan needs to be adjusted.   It is performed at least 2 times a year if you are meeting treatment goals.  It is performed 4 times a year if therapy has changed or if you are not meeting treatment goals. Blood pressure test  This test is performed at every routine medical visit. The goal is less than 140/90 mmHg for most people, but 130/80 mmHg in some cases. Ask your health care provider about your goal. Dental exam  Follow up with the dentist regularly. Eye exam  If you are diagnosed with type 1 diabetes as a child, get an exam upon reaching the age of 71 years or older and have had diabetes for 3 5 years. Yearly eye exams are recommended after that initial eye exam.  If you are diagnosed with type 1 diabetes as an adult, get an exam within  5 years of diagnosis and then yearly.  If you are diagnosed with type 2 diabetes, get an exam as soon as possible after the diagnosis and then yearly. Foot care exam  Visual foot exams are performed at every routine medical visit. The exams check for cuts, injuries, or other problems with the feet.  A comprehensive foot exam should be done yearly. This includes visual inspection as well as assessing foot pulses and testing for loss of sensation.  Check your feet nightly for cuts, injuries, or other problems with your feet. Tell your health care provider if anything is not healing. Kidney function test (urine microalbumin)  This test is performed once a year.  Type 1 diabetes: The first test is performed 5 years after diagnosis.  Type 2 diabetes: The first test is performed at the time of diagnosis.  A serum creatinine and estimated glomerular filtration rate (eGFR) test is done once a year to assess the level of chronic kidney disease (CKD), if present. Lipid profile (cholesterol, HDL, LDL, triglycerides)  Performed every 5 years for most people.  The goal for LDL is less than 100 mg/dL. If you are at high risk, the goal is less than 70 mg/dL.  The goal for HDL is 40 mg/dL 50 mg/dL for men and 50 mg/dL 60 mg/dL  for women. An HDL cholesterol of 60 mg/dL or higher gives some protection against heart disease.  The goal for triglycerides is less than 150 mg/dL. Influenza vaccine, pneumococcal vaccine, and hepatitis B vaccine  The influenza vaccine is recommended yearly.  The pneumococcal vaccine is generally given once in a lifetime. However, there are some instances when another vaccination is recommended. Check with your health care provider.  The hepatitis B vaccine is also recommended for adults with diabetes. Diabetes self-management education  Education is recommended at diagnosis and ongoing as needed. Treatment plan  Your treatment plan is reviewed at every medical  visit. Document Released: 09/13/2009 Document Revised: 07/19/2013 Document Reviewed: 04/18/2013 [facility] Patient Information 2014 [place].

## 2014-01-31 NOTE — Assessment & Plan Note (Addendum)
Last seen May 2014 for uncontrolled DM and asked to return in two months with  log of BS but did not A1c is 10.0 but patient states that her sugars are "up and down" so i cannot amedn regimen without hard data.  Asked to return in 1 month. 3rd referral to [facility] for diabetes education,  And referral to Dr Sandra Cockayne for overdue eye exam made.

## 2014-01-31 NOTE — Progress Notes (Signed)
Patient ID: Julie Dominguez, female   DOB: 1951/01/24, 63 y.o.   MRN: [id number]    Patient Active Problem List   Diagnosis Date Noted  . Noncompliance with diabetes treatment 01/31/2014  . Screening for breast cancer 03/02/2013  . Screening for colon cancer 03/02/2013  . Screening for cervical cancer 03/02/2013  . GAD (generalized anxiety disorder) 05/08/2012  . Cataract associated with another disorder 05/08/2012  . Diabetes mellitus type II, uncontrolled 05/01/2012  . Accelerated hypertension 04/25/2012  . Hypertension   . Hyperlipidemia     Subjective:  CC:   Chief Complaint  Patient presents with  . Follow-up  . Diabetes    HPI:   Julie Dominguez is a 63 y.o. female who presents for Follow up on uncontrolled diabetes and uncontrolled hypertension,  Patient has been lost to follow up since may 2014 at which time she was qsked to return in 2 months.  She has not brought a log of her blood sugars but states that she checks them 2 or 3 times per week  In the morning  and they have been "up and down."   Despite having a history of cataracts and diabetes, She has not seen her eye doctor since July 2013. Never went to [place] for the diabetes education that we discussed on two prior visits.   Last colonoscopy was over 10 yrs ago at the hospital      Past Medical History  Diagnosis Date  . Diabetes mellitus 2007  . Hypertension 1988    uncontrolled   . Hyperlipidemia     Past Surgical History  Procedure Laterality Date  . Eye surgery Bilateral August 2013    catraract with lens implant dingledein        The following portions of the patient's history were reviewed and updated as appropriate: Allergies, current medications, and problem list.    Review of Systems:   Patient denies headache, fevers, malaise, unintentional weight loss, skin rash, eye pain, sinus congestion and sinus pain, sore throat, dysphagia,  hemoptysis , cough, dyspnea, wheezing, chest pain,  palpitations, orthopnea, edema, abdominal pain, nausea, melena, diarrhea, constipation, flank pain, dysuria, hematuria, urinary  Frequency, nocturia, numbness, tingling, seizures,  Focal weakness, Loss of consciousness,  Tremor, insomnia, depression, anxiety, and suicidal ideation.     History   Social History  . Marital Status: Single    Spouse Name: N/A    Number of Children: N/A  . Years of Education: N/A   Occupational History  . Not on file.   Social History Main Topics  . Smoking status: Never Smoker   . Smokeless tobacco: Never Used  . Alcohol Use: No  . Drug Use: No  . Sexual Activity: Not Currently   Other Topics Concern  . Not on file   Social History Narrative  . No narrative on file    Objective:  Filed Vitals:   01/31/14 1411  BP: 178/90  Pulse: 88  Temp: 99 F (37.2 C)  Resp: 16     General appearance: alert, cooperative and appears stated age Ears: normal TM's and external ear canals both ears Throat: lips, mucosa, and tongue normal; teeth and gums normal Neck: no adenopathy, no carotid bruit, supple, symmetrical, trachea midline and thyroid not enlarged, symmetric, no tenderness/mass/nodules Back: symmetric, no curvature. ROM normal. No CVA tenderness. Lungs: clear to auscultation bilaterally Heart: regular rate and rhythm, S1, S2 normal, no murmur, click, rub or gallop Abdomen: soft, non-tender; bowel sounds normal; no masses,  no organomegaly Pulses: 2+ and symmetric Skin: Skin color, texture, turgor normal. No rashes or lesions Lymph nodes: Cervical, supraclavicular, and axillary nodes normal. Foot exam:  Nails are well trimmed,  No callouses,  Sensation intact to microfilament  Assessment and Plan:  Diabetes mellitus type II, uncontrolled Last seen May 2014 for uncontrolled DM and asked to return in two months with  log of BS but did not A1c is 10.0 but patient states that her sugars are "up and down" so i cannot amedn regimen without hard  data.  Asked to return in 1 month. 3rd referral to [place] for diabetes education,  And referral to Dr Sandra Cockayne for overdue eye exam made.   Noncompliance with diabetes treatment Patient has not bee checking blood sugars as directed has not been seen in 10 months, did not go to the diabetes educational classes we referred her for,  And is behind on her annual eye exams despite having a history of cataract surgery.  Aqc is now 10.0 . I have advised her to return in one month with a log of blood sugars . To be checked twice daily.  I have advised her that failure to do so will result in dismissal from my clinic.   Hypertension Uncontrolled despite stated compliance with 5 medications .Compliance and/or anxiety are contributing but given its elevatio nwill incresae the clonidine to 0.2 mg bid and contiue all other medications.  Renal artery stenosis ruled out last July by [facility].  No signs or symptoms of OSA.  No evidence of hyperaldosteronism by prior serologic testing.   Lab Results  Component Value Date   CREATININE 1.0 01/31/2014   Lab Results  Component Value Date   MICROALBUR 14.3* 01/31/2014      Screening for breast cancer She was referred for mammofram last year and did not have it done reordered today  Screening for colon cancer Last colonoscopy unknown, over 10 yrs ago per patient.,   Hyperlipidemia Lab Results  Component Value Date   CHOL 182 02/22/2013   HDL 46.30 02/22/2013   LDLCALC 115* 02/22/2013   LDLDIRECT 123.0 01/31/2014   TRIG 106.0 02/22/2013   CHOLHDL 4 02/22/2013   She is not fasting today.  We wil repeat fasting lipids if she returns in one month. Lab Results  Component Value Date   ALT 22 01/31/2014   AST 16 01/31/2014   ALKPHOS 114 01/31/2014   BILITOT 0.8 01/31/2014     A total of 40 minutes was spent with patient more than half of which was spent in counseling, reviewing records from other prviders and coordination of care. Updated Medication  List Outpatient Encounter Prescriptions as of 01/31/2014  Medication Sig  . amLODipine (NORVASC) 10 MG tablet Take 1 tablet (10 mg total) by mouth daily.  . cloNIDine (CATAPRES) 0.2 MG tablet TAKE 1 TABLET BY MOUTH TWICE A DAY  . glipiZIDE (GLUCOTROL) 10 MG tablet Take 1 tablet (10 mg total) by mouth 2 (two) times daily before a meal.  . hydrochlorothiazide (HYDRODIURIL) 25 MG tablet TAKE 1 TABLET BY MOUTH EVERY DAY  . ibuprofen (ADVIL,MOTRIN) 200 MG tablet Take 200 mg by mouth as needed.  . Insulin Detemir (LEVEMIR FLEXPEN) 100 UNIT/ML SOPN Inject 20 Units into the skin daily.  . Insulin Pen Needle ([employer] PEN NEEDLES 31G) 31G X 8 MM MISC Inject insulin two times daily  . Insulin Pen Needle 32G X 6 MM MISC Use  Daily with Levemir insulin  . losartan (COZAAR) 100  MG tablet Take 1 tablet (100 mg total) by mouth daily.  . metFORMIN (GLUCOPHAGE) 1000 MG tablet Take 1 tablet (1,000 mg total) by mouth 2 (two) times daily with a meal.  . sertraline (ZOLOFT) 50 MG tablet TAKE 1 TABLET BY MOUTH EVERY DAY  . simvastatin (ZOCOR) 40 MG tablet TAKE 1 TABLET (40 MG TOTAL) BY MOUTH EVERY EVENING.  Marland Kitchen spironolactone (ALDACTONE) 50 MG tablet TAKE 1 TABLET (50 MG TOTAL) BY MOUTH DAILY.  . [DISCONTINUED] cloNIDine (CATAPRES) 0.1 MG tablet TAKE 1 TABLET BY MOUTH TWICE A DAY  . [DISCONTINUED] cloNIDine (CATAPRES) 0.1 MG tablet TAKE 1 TABLET BY MOUTH TWICE A DAY  . [DISCONTINUED] Glucose Blood (BLOOD GLUCOSE TEST STRIPS) STRP Test blood sugar two times daily.  . [DISCONTINUED] hydrochlorothiazide (HYDRODIURIL) 25 MG tablet TAKE 1 TABLET BY MOUTH EVERY DAY  . [DISCONTINUED] losartan (COZAAR) 100 MG tablet TAKE 1 TABLET BY MOUTH EVERY DAY  . [DISCONTINUED] metFORMIN (GLUCOPHAGE) 500 MG tablet TAKE 1 TABLET BY MOUTH TWICE A DAY WITH MEALS  . [DISCONTINUED] metFORMIN (GLUCOPHAGE) 500 MG tablet Take 1 tablet (500 mg total) by mouth 2 (two) times daily with a meal. NEEDS APPT FOR FURTHER REFILLS*PLEASE CONTACT OUR OFFICE   . [DISCONTINUED] simvastatin (ZOCOR) 40 MG tablet Take 1 tablet (40 mg total) by mouth every evening.

## 2014-01-31 NOTE — Progress Notes (Signed)
Pre-visit discussion using our clinic review tool. No additional management support is needed unless otherwise documented below in the visit note.  

## 2014-02-01 ENCOUNTER — Other Ambulatory Visit: Payer: Self-pay | Admitting: Internal Medicine

## 2014-02-01 LAB — MICROALBUMIN / CREATININE URINE RATIO
CREATININE, U: 103.4 mg/dL
Microalb Creat Ratio: 13.8 mg/g (ref 0.0–30.0)
Microalb, Ur: 14.3 mg/dL — ABNORMAL HIGH (ref 0.0–1.9)

## 2014-02-02 ENCOUNTER — Telehealth: Payer: Self-pay

## 2014-02-02 ENCOUNTER — Encounter: Payer: Self-pay | Admitting: Internal Medicine

## 2014-02-02 NOTE — Assessment & Plan Note (Addendum)
Uncontrolled despite stated compliance with 5 medications .Compliance and/or anxiety are contributing but given its elevatio nwill incresae the clonidine to 0.2 mg bid and contiue all other medications.  Renal artery stenosis ruled out last July by [facility].  No signs or symptoms of OSA.  No evidence of hyperaldosteronism by prior serologic testing.   Lab Results  Component Value Date   CREATININE 1.0 01/31/2014   Lab Results  Component Value Date   MICROALBUR 14.3* 01/31/2014

## 2014-02-02 NOTE — Assessment & Plan Note (Signed)
She was referred for mammofram last year and did not have it done reordered today

## 2014-02-02 NOTE — Assessment & Plan Note (Signed)
Lab Results  Component Value Date   CHOL 182 02/22/2013   HDL 46.30 02/22/2013   LDLCALC 115* 02/22/2013   LDLDIRECT 123.0 01/31/2014   TRIG 106.0 02/22/2013   CHOLHDL 4 02/22/2013   She is not fasting today.  We wil repeat fasting lipids if she returns in one month. Lab Results  Component Value Date   ALT 22 01/31/2014   AST 16 01/31/2014   ALKPHOS 114 01/31/2014   BILITOT 0.8 01/31/2014

## 2014-02-02 NOTE — Assessment & Plan Note (Signed)
Patient has not bee checking blood sugars as directed has not been seen in 10 months, did not go to the diabetes educational classes we referred her for,  And is behind on her annual eye exams despite having a history of cataract surgery.  Aqc is now 10.0 . I have advised her to return in one month with a log of blood sugars . To be checked twice daily.  I have advised her that failure to do so will result in dismissal from my clinic.

## 2014-02-02 NOTE — Assessment & Plan Note (Signed)
Last colonoscopy unknown, over 10 yrs ago per patient.,

## 2014-02-02 NOTE — Telephone Encounter (Signed)
Relevant patient education mailed to patient.  

## 2014-02-19 ENCOUNTER — Ambulatory Visit: Payer: Self-pay | Admitting: General Surgery

## 2014-02-21 ENCOUNTER — Ambulatory Visit (INDEPENDENT_AMBULATORY_CARE_PROVIDER_SITE_OTHER): Payer: No Typology Code available for payment source | Admitting: General Surgery

## 2014-02-21 ENCOUNTER — Other Ambulatory Visit: Payer: Self-pay | Admitting: *Deleted

## 2014-02-21 ENCOUNTER — Encounter: Payer: Self-pay | Admitting: General Surgery

## 2014-02-21 VITALS — BP 128/76 | HR 78 | Resp 12 | Ht 63.0 in | Wt 138.0 lb

## 2014-02-21 DIAGNOSIS — Z1211 Encounter for screening for malignant neoplasm of colon: Secondary | ICD-10-CM

## 2014-02-21 MED ORDER — POLYETHYLENE GLYCOL 3350 17 GM/SCOOP PO POWD: ORAL | Status: DC

## 2014-02-21 NOTE — Progress Notes (Signed)
Patient ID: Julie Dominguez, female   DOB: May 31, 1951, 63 y.o.   MRN: [id number]  Chief Complaint  Patient presents with  . Colonoscopy    HPI Julie Dominguez is a 63 y.o. female.  Here today for evaluation of colonoscopy referred by Dr. Derrel Nip. She thinks she had a colonoscopy about 25 yrs ago but does remember and thinks it was normal. Family history of colon cancer includes a maternal aunt. Denies any gastrointestinal issues. She states her blood sugars generally run in 140- 200's. This morning it was 141.   She has an appointment with Dr. Derrel Nip for April 1st 2015.    HPI  Past Medical History  Diagnosis Date  . Diabetes mellitus 2007  . Hypertension 1988    uncontrolled   . Hyperlipidemia     Past Surgical History  Procedure Laterality Date  . Eye surgery Bilateral August 2013, Oct 2013    catraract with lens implant dingledein     Family History  Problem Relation Age of Onset  . Stroke Mother   . Hypertension Mother   . Heart disease Daughter   . Cancer - Colon Maternal Aunt     Social History History  Substance Use Topics  . Smoking status: Never Smoker   . Smokeless tobacco: Never Used  . Alcohol Use: No    No Known Allergies  Current Outpatient Prescriptions  Medication Sig Dispense Refill  . amLODipine (NORVASC) 10 MG tablet Take 1 tablet (10 mg total) by mouth daily.  90 tablet  3  . cloNIDine (CATAPRES) 0.2 MG tablet TAKE 1 TABLET BY MOUTH TWICE A DAY  60 tablet  3  . glipiZIDE (GLUCOTROL) 10 MG tablet Take 1 tablet (10 mg total) by mouth 2 (two) times daily before a meal.  180 tablet  3  . hydrochlorothiazide (HYDRODIURIL) 25 MG tablet TAKE 1 TABLET BY MOUTH EVERY DAY  30 tablet  5  . ibuprofen (ADVIL,MOTRIN) 200 MG tablet Take 200 mg by mouth as needed.      . Insulin Detemir (LEVEMIR FLEXPEN) 100 UNIT/ML SOPN Inject 20 Units into the skin daily.  15 mL  11  . Insulin Pen Needle ([employer] PEN NEEDLES 31G) 31G X 8 MM MISC Inject insulin two times daily  100  each  5  . Insulin Pen Needle 32G X 6 MM MISC Use  Daily with Levemir insulin  100 each  3  . losartan (COZAAR) 100 MG tablet Take 1 tablet (100 mg total) by mouth daily.  90 tablet  3  . metFORMIN (GLUCOPHAGE) 1000 MG tablet Take 1 tablet (1,000 mg total) by mouth 2 (two) times daily with a meal.  180 tablet  3  . ONE TOUCH ULTRA TEST test strip TEST SUGAR TWICE A DAY  100 each  3  . sertraline (ZOLOFT) 50 MG tablet TAKE 1 TABLET BY MOUTH EVERY DAY  30 tablet  0  . simvastatin (ZOCOR) 40 MG tablet TAKE 1 TABLET (40 MG TOTAL) BY MOUTH EVERY EVENING.  30 tablet  0  . spironolactone (ALDACTONE) 50 MG tablet TAKE 1 TABLET (50 MG TOTAL) BY MOUTH DAILY.  30 tablet  5  . polyethylene glycol powder (GLYCOLAX/MIRALAX) powder 255 grams one bottle for colonoscopy prep  255 g  0   No current facility-administered medications for this visit.    Review of Systems Review of Systems  Constitutional: Negative.   Respiratory: Negative.   Cardiovascular: Negative.   Gastrointestinal: Negative for nausea, vomiting, diarrhea, constipation  and blood in stool.    Blood pressure 128/76, pulse 78, resp. rate 12, height 5\' 3"  (1.6 m), weight 138 lb (62.596 kg).  Physical Exam Physical Exam  Constitutional: She is oriented to person, place, and time. She appears well-developed and well-nourished.  Neck: Neck supple.  Cardiovascular: Normal rate, regular rhythm and normal heart sounds.   Pulmonary/Chest: Effort normal and breath sounds normal.  Lymphadenopathy:    She has no cervical adenopathy.  Neurological: She is alert and oriented to person, place, and time.  Skin: Skin is warm and dry.    Data Reviewed PCP notes.  Assessment    Candidate for screening colonoscopy.     Plan    Colonoscopy with possible biopsy/polypectomy prn: Information regarding the procedure, including its potential risks and complications (including but not limited to perforation of the bowel, which may require emergency  surgery to repair, and bleeding) was verbally given to the patient. Educational information regarding lower instestinal endoscopy was given to the patient. Written instructions for how to complete the bowel prep using Miralax were provided. The importance of drinking ample fluids to avoid dehydration as a result of the prep emphasized.    Patient has been scheduled for a colonoscopy on 04-18-14 at [facility]. This patient has been asked to adjust her medications as follows: hold glucophage, insulin, and hydrochlorothiazide day of prep and procedure. It is okay for patient to continue glipizide and aldactone.   PCP: Dr. Cecil Cranker, [place] 02/21/2014, 9:04 PM

## 2014-02-21 NOTE — Patient Instructions (Addendum)
Colonoscopy A colonoscopy is an exam to look at the entire large intestine (colon). This exam can help find problems such as tumors, polyps, inflammation, and areas of bleeding. The exam takes about 1 hour.  LET [facility] CARE PROVIDER KNOW ABOUT:   Any allergies you have.  All medicines you are taking, including vitamins, herbs, eye drops, creams, and over-the-counter medicines.  Previous problems you or members of your family have had with the use of anesthetics.  Any blood disorders you have.  Previous surgeries you have had.  Medical conditions you have. RISKS AND COMPLICATIONS  Generally, this is a safe procedure. However, as with any procedure, complications can occur. Possible complications include:  Bleeding.  Tearing or rupture of the colon wall.  Reaction to medicines given during the exam.  Infection (rare). BEFORE THE PROCEDURE   Ask your health care provider about changing or stopping your regular medicines.  You may be prescribed an oral bowel prep. This involves drinking a large amount of medicated liquid, starting the day before your procedure. The liquid will cause you to have multiple loose stools until your stool is almost clear or light green. This cleans out your colon in preparation for the procedure.  Do not eat or drink anything else once you have started the bowel prep, unless your health care provider tells you it is safe to do so.  Arrange for someone to drive you home after the procedure. PROCEDURE   You will be given medicine to help you relax (sedative).  You will lie on your side with your knees bent.  A long, flexible tube with a light and camera on the end (colonoscope) will be inserted through the rectum and into the colon. The camera sends video back to a computer screen as it moves through the colon. The colonoscope also releases carbon dioxide gas to inflate the colon. This helps your health care provider see the area better.  During  the exam, your health care provider may take a small tissue sample (biopsy) to be examined under a microscope if any abnormalities are found.  The exam is finished when the entire colon has been viewed. AFTER THE PROCEDURE   Do not drive for 24 hours after the exam.  You may have a small amount of blood in your stool.  You may pass moderate amounts of gas and have mild abdominal cramping or bloating. This is caused by the gas used to inflate your colon during the exam.  Ask when your test results will be ready and how you will get your results. Make sure you get your test results. Document Released: 11/13/2000 Document Revised: 09/06/2013 Document Reviewed: 07/24/2013 [facility] Patient Information 2014 [place].  Patient has been scheduled for a colonoscopy on 04-18-14 at [facility]. This patient has been asked to adjust her medications as follows: hold glucophage, insulin, and hydrochlorothiazide day of prep and procedure. It is okay for patient to continue glipizide and aldactone.

## 2014-02-22 MED ORDER — SIMVASTATIN 40 MG PO TABS: ORAL_TABLET | ORAL | Status: DC

## 2014-02-22 MED ORDER — SERTRALINE HCL 50 MG PO TABS: ORAL_TABLET | ORAL | Status: DC

## 2014-02-28 ENCOUNTER — Encounter: Payer: Self-pay | Admitting: Internal Medicine

## 2014-02-28 ENCOUNTER — Ambulatory Visit (INDEPENDENT_AMBULATORY_CARE_PROVIDER_SITE_OTHER): Payer: PRIVATE HEALTH INSURANCE | Admitting: Internal Medicine

## 2014-02-28 VITALS — BP 188/84 | HR 94 | Temp 98.3°F | Resp 16 | Wt 136.0 lb

## 2014-02-28 DIAGNOSIS — IMO0001 Reserved for inherently not codable concepts without codable children: Secondary | ICD-10-CM

## 2014-02-28 DIAGNOSIS — E1165 Type 2 diabetes mellitus with hyperglycemia: Secondary | ICD-10-CM

## 2014-02-28 DIAGNOSIS — I10 Essential (primary) hypertension: Secondary | ICD-10-CM

## 2014-02-28 DIAGNOSIS — IMO0002 Reserved for concepts with insufficient information to code with codable children: Secondary | ICD-10-CM

## 2014-02-28 LAB — GLUCOSE, POCT (MANUAL RESULT ENTRY): POC Glucose: 282 mg/dl — AB (ref 70–99)

## 2014-02-28 MED ORDER — INSULIN NPH (HUMAN) (ISOPHANE) 100 UNIT/ML ~~LOC~~ SUSP: 15.0000 [IU] | Freq: Every day | SUBCUTANEOUS | Status: DC

## 2014-02-28 NOTE — Progress Notes (Signed)
Pre-visit discussion using our clinic review tool. No additional management support is needed unless otherwise documented below in the visit note.  

## 2014-02-28 NOTE — Patient Instructions (Addendum)
We are changing your insulin from Levemir to NPH,  Which will be less $$$$ .  You will start taking 15 units at bedtime.    continue using the glipizide 10 mg before breakfast and before dinner  Try to avoid instant oatmeal and cheerios, because they  are "High glycemic"  Yogurt,  Eggs and peanut butter on a toasted sandwhich thin or bagel thin is a better breakfast choice for people with diabetes   Return your blood sugar log in 2 weeks so I can adjust your insulin dose   Our goal is to get your fasting blood sugars to < 120

## 2014-02-28 NOTE — Progress Notes (Signed)
Patient ID: Julie Dominguez, female   DOB: [birth year], 63 y.o.   MRN: [id number]  Patient Active Problem List   Diagnosis Date Noted  . Special screening for malignant neoplasms, colon 02/21/2014  . Noncompliance with diabetes treatment 01/31/2014  . Screening for breast cancer 03/02/2013  . Screening for colon cancer 03/02/2013  . Screening for cervical cancer 03/02/2013  . GAD (generalized anxiety disorder) 05/08/2012  . Cataract associated with another disorder 05/08/2012  . Diabetes mellitus type II, uncontrolled 05/01/2012  . Accelerated hypertension 04/25/2012  . Hypertension   . Hyperlipidemia     Subjective:  CC:   No chief complaint on file.   HPI:   Julie Dominguez is a 63 y.o. female who presents for 1 month follow up on uncontrolled DM   Lab Results  Component Value Date   HGBA1C 10.0* 01/31/2014  She has been checking her BS twice daily since last visit and has brought her log with her. She has been attending the [facility] diabetes classes but has not really changed her diet.  Eating instant oatmeal and cheerios for breakfast.  cbg  Is currently 282,  4 hr post prandial  Was 203 2 hrs post prandial and her fasting  today was   164   Oatmeal, plain with a little sugar,     Has been out of Levemir for a week.        Past Medical History  Diagnosis Date  . Diabetes mellitus 2007  . Hypertension 1988    uncontrolled   . Hyperlipidemia     Past Surgical History  Procedure Laterality Date  . Eye surgery Bilateral August 2013, Oct 2013    catraract with lens implant dingledein        The following portions of the patient's history were reviewed and updated as appropriate: Allergies, current medications, and problem list.    Review of Systems:   Patient denies headache, fevers, malaise, unintentional weight loss, skin rash, eye pain, sinus congestion and sinus pain, sore throat, dysphagia,  hemoptysis , cough, dyspnea, wheezing, chest pain,  palpitations, orthopnea, edema, abdominal pain, nausea, melena, diarrhea, constipation, flank pain, dysuria, hematuria, urinary  Frequency, nocturia, numbness, tingling, seizures,  Focal weakness, Loss of consciousness,  Tremor, insomnia, depression, anxiety, and suicidal ideation.     History   Social History  . Marital Status: Single    Spouse Name: N/A    Number of Children: N/A  . Years of Education: N/A   Occupational History  . Not on file.   Social History Main Topics  . Smoking status: Never Smoker   . Smokeless tobacco: Never Used  . Alcohol Use: No  . Drug Use: No  . Sexual Activity: Not Currently   Other Topics Concern  . Not on file   Social History Narrative  . No narrative on file    Objective:  Filed Vitals:   02/28/14 1142  BP: 188/84  Pulse: 94  Temp: 98.3 F (36.8 C)  Resp: 16     General appearance: alert, cooperative and appears stated age Ears: normal TM's and external ear canals both ears Throat: lips, mucosa, and tongue normal; teeth and gums normal Neck: no adenopathy, no carotid bruit, supple, symmetrical, trachea midline and thyroid not enlarged, symmetric, no tenderness/mass/nodules Back: symmetric, no curvature. ROM normal. No CVA tenderness. Lungs: clear to auscultation bilaterally Heart: regular rate and rhythm, S1, S2 normal, no murmur, click, rub or gallop Abdomen: soft, non-tender; bowel sounds normal; no  masses,  no organomegaly Pulses: 2+ and symmetric Skin: Skin color, texture, turgor normal. No rashes or lesions Lymph nodes: Cervical, supraclavicular, and axillary nodes normal.  Assessment and Plan:  Diabetes mellitus type II, uncontrolled Secondary to dietary noncompliance and lapses in medication. Changing levemir to NPH starting with 15 units and bedtime.  Continue glipizide 10 mg bid.  Return in  One to two weeks with s.  Diet was reviewed and discussed  in detail  Hypertension Elevated today.  Reviewed list of meds,  patient is not taking OTC meds that could be causing,. It.  Have asked patient to recheck bp at home a minimum of 5 times over the next 4 weeks and call readings to office for adjustment of medications.    A total of 25 minutes was spent with patient more than half of which was spent in counseling,    Updated Medication List Outpatient Encounter Prescriptions as of 02/28/2014  Medication Sig  . amLODipine (NORVASC) 10 MG tablet Take 1 tablet (10 mg total) by mouth daily.  . cloNIDine (CATAPRES) 0.2 MG tablet TAKE 1 TABLET BY MOUTH TWICE A DAY  . glipiZIDE (GLUCOTROL) 10 MG tablet Take 1 tablet (10 mg total) by mouth 2 (two) times daily before a meal.  . hydrochlorothiazide (HYDRODIURIL) 25 MG tablet TAKE 1 TABLET BY MOUTH EVERY DAY  . ibuprofen (ADVIL,MOTRIN) 200 MG tablet Take 200 mg by mouth as needed.  . Insulin Pen Needle ([employer] PEN NEEDLES 31G) 31G X 8 MM MISC Inject insulin two times daily  . Insulin Pen Needle 32G X 6 MM MISC Use  Daily with Levemir insulin  . metFORMIN (GLUCOPHAGE) 1000 MG tablet Take 1 tablet (1,000 mg total) by mouth 2 (two) times daily with a meal.  . ONE TOUCH ULTRA TEST test strip TEST SUGAR TWICE A DAY  . polyethylene glycol powder (GLYCOLAX/MIRALAX) powder 255 grams one bottle for colonoscopy prep  . sertraline (ZOLOFT) 50 MG tablet TAKE 1 TABLET BY MOUTH EVERY DAY  . simvastatin (ZOCOR) 40 MG tablet TAKE 1 TABLET (40 MG TOTAL) BY MOUTH EVERY EVENING.  Marland Kitchen spironolactone (ALDACTONE) 50 MG tablet TAKE 1 TABLET (50 MG TOTAL) BY MOUTH DAILY.  . [DISCONTINUED] Insulin Detemir (LEVEMIR FLEXPEN) 100 UNIT/ML SOPN Inject 20 Units into the skin daily.  . insulin NPH Human (NOVOLIN N) 100 UNIT/ML injection Inject 0.15 mLs (15 Units total) into the skin at bedtime.  Marland Kitchen losartan (COZAAR) 100 MG tablet Take 1 tablet (100 mg total) by mouth daily.     Orders Placed This Encounter  Procedures  . POCT Glucose (CBG)    No Follow-up on file.

## 2014-03-02 NOTE — Assessment & Plan Note (Signed)
Elevated today.  Reviewed list of meds, patient is not taking OTC meds that could be causing,. It.  Have asked patient to recheck bp at home a minimum of 5 times over the next 4 weeks and call readings to office for adjustment of medications.   

## 2014-03-02 NOTE — Assessment & Plan Note (Addendum)
Secondary to dietary noncompliance and lapses in medication. Changing levemir to NPH starting with 15 units and bedtime.  Continue glipizide 10 mg bid.  Return in  One to two weeks with s.  Diet was reviewed and discussed  in detail

## 2014-03-14 ENCOUNTER — Encounter (INDEPENDENT_AMBULATORY_CARE_PROVIDER_SITE_OTHER): Payer: Self-pay

## 2014-03-14 ENCOUNTER — Encounter: Payer: Self-pay | Admitting: Internal Medicine

## 2014-03-14 ENCOUNTER — Ambulatory Visit (INDEPENDENT_AMBULATORY_CARE_PROVIDER_SITE_OTHER): Payer: PRIVATE HEALTH INSURANCE | Admitting: Internal Medicine

## 2014-03-14 VITALS — BP 180/80 | HR 93 | Temp 98.2°F | Resp 16 | Wt 136.8 lb

## 2014-03-14 DIAGNOSIS — IMO0002 Reserved for concepts with insufficient information to code with codable children: Secondary | ICD-10-CM

## 2014-03-14 DIAGNOSIS — I10 Essential (primary) hypertension: Secondary | ICD-10-CM

## 2014-03-14 DIAGNOSIS — E1165 Type 2 diabetes mellitus with hyperglycemia: Secondary | ICD-10-CM

## 2014-03-14 DIAGNOSIS — IMO0001 Reserved for inherently not codable concepts without codable children: Secondary | ICD-10-CM

## 2014-03-14 MED ORDER — "INSULIN SYRINGE/NEEDLE 28G X 1/2"" 1 ML MISC": 1.0000 | Freq: Two times a day (BID) | Status: DC

## 2014-03-14 MED ORDER — HYDROCHLOROTHIAZIDE 25 MG PO TABS: 25.0000 mg | ORAL_TABLET | Freq: Every day | ORAL | Status: DC

## 2014-03-14 MED ORDER — INSULIN NPH (HUMAN) (ISOPHANE) 100 UNIT/ML ~~LOC~~ SUSP: SUBCUTANEOUS | Status: DC

## 2014-03-14 NOTE — Progress Notes (Signed)
Pre-visit discussion using our clinic review tool. No additional management support is needed unless otherwise documented below in the visit note.  

## 2014-03-14 NOTE — Progress Notes (Signed)
Patient ID: Julie Dominguez, female   DOB: 1951-02-01, 63 y.o.   MRN: [id number]   Patient Active Problem List   Diagnosis Date Noted  . Special screening for malignant neoplasms, colon 02/21/2014  . Noncompliance with diabetes treatment 01/31/2014  . Screening for breast cancer 03/02/2013  . Screening for colon cancer 03/02/2013  . Screening for cervical cancer 03/02/2013  . GAD (generalized anxiety disorder) 05/08/2012  . Cataract associated with another disorder 05/08/2012  . Diabetes mellitus type II, uncontrolled 05/01/2012  . Accelerated hypertension 04/25/2012  . Hypertension   . Hyperlipidemia     Subjective:  CC:   Chief Complaint  Patient presents with  . Follow-up    2 week    HPI:   Julie Dominguez is a 63 y.o. female who presents for 2 week follow up on uncontrolled diabetes mellitus.  At last visit her insulin was changed from levemir to NPH 15 units at bedtime.  She has had only one low of 78, none below that., which occurred at 8 am .  The other fastings have been 120 to 150.   She has  modified her diet to eliminate excess starches, specifically cereal.    Recent history:   1 month follow up on uncontrolled DM   Lab Results  Component Value Date   HGBA1C 10.0* 01/31/2014  She has been checking her BS twice daily since last visit and has brought her log with her. She has been attending the [employer] diabetes classes but has not really changed her diet.  Eating instant oatmeal and cheerios for breakfast.  cbg  Is currently 282,  4 hr post prandial  Was 203 2 hrs post prandial and her fasting  today was   164   Oatmeal, plain with a little sugar,     Has been out of Levemir for a week.        Past Medical History  Diagnosis Date  . Diabetes mellitus 2007  . Hypertension 1988    uncontrolled   . Hyperlipidemia     Past Surgical History  Procedure Laterality Date  . Eye surgery Bilateral August 2013, Oct 2013    catraract with lens implant  dingledein        The following portions of the patient's history were reviewed and updated as appropriate: Allergies, current medications, and problem list.    Review of Systems:   Patient denies headache, fevers, malaise, unintentional weight loss, skin rash, eye pain, sinus congestion and sinus pain, sore throat, dysphagia,  hemoptysis , cough, dyspnea, wheezing, chest pain, palpitations, orthopnea, edema, abdominal pain, nausea, melena, diarrhea, constipation, flank pain, dysuria, hematuria, urinary  Frequency, nocturia, numbness, tingling, seizures,  Focal weakness, Loss of consciousness,  Tremor, insomnia, depression, anxiety, and suicidal ideation.     History   Social History  . Marital Status: Single    Spouse Name: N/A    Number of Children: N/A  . Years of Education: N/A   Occupational History  . Not on file.   Social History Main Topics  . Smoking status: Never Smoker   . Smokeless tobacco: Never Used  . Alcohol Use: No  . Drug Use: No  . Sexual Activity: Not Currently   Other Topics Concern  . Not on file   Social History Narrative  . No narrative on file    Objective:  Filed Vitals:   03/14/14 1146  BP: 180/80  Pulse: 93  Temp: 98.2 F (36.8 C)  Resp: 16  General appearance: alert, cooperative and appears stated age Ears: normal TM's and external ear canals both ears Throat: lips, mucosa, and tongue normal; teeth and gums normal Neck: no adenopathy, no carotid bruit, supple, symmetrical, trachea midline and thyroid not enlarged, symmetric, no tenderness/mass/nodules Back: symmetric, no curvature. ROM normal. No CVA tenderness. Lungs: clear to auscultation bilaterally Heart: regular rate and rhythm, S1, S2 normal, no murmur, click, rub or gallop Abdomen: soft, non-tender; bowel sounds normal; no masses,  no organomegaly Pulses: 2+ and symmetric Skin: Skin color, texture, turgor normal. No rashes or lesions Lymph nodes: Cervical,  supraclavicular, and axillary nodes normal.  Assessment and Plan:  Hypertension Elevated due to white coat hypertension.  Home bps have been   Diabetes mellitus type II, uncontrolled 2 week follow up done without log of sugars (left it on table)  With improvement in fastings since starting NPH 15 unis qhs.  Advised to add 5 units qam..  Asked to check sugars at 3pm or pre dinner and return in 2 weeks.   Accelerated hypertension Home bps are better than here but ot at [facility]  Adding hctz 25 mg dailyu.    Updated Medication List Outpatient Encounter Prescriptions as of 03/14/2014  Medication Sig  . amLODipine (NORVASC) 10 MG tablet Take 1 tablet (10 mg total) by mouth daily.  . cloNIDine (CATAPRES) 0.2 MG tablet TAKE 1 TABLET BY MOUTH TWICE A DAY  . glipiZIDE (GLUCOTROL) 10 MG tablet Take 1 tablet (10 mg total) by mouth 2 (two) times daily before a meal.  . hydrochlorothiazide (HYDRODIURIL) 25 MG tablet TAKE 1 TABLET BY MOUTH EVERY DAY  . ibuprofen (ADVIL,MOTRIN) 200 MG tablet Take 200 mg by mouth as needed.  . insulin NPH Human (NOVOLIN N) 100 UNIT/ML injection 15 units at bedtime,  5 units at breakfast  . metFORMIN (GLUCOPHAGE) 1000 MG tablet Take 1 tablet (1,000 mg total) by mouth 2 (two) times daily with a meal.  . ONE TOUCH ULTRA TEST test strip TEST SUGAR TWICE A DAY  . polyethylene glycol powder (GLYCOLAX/MIRALAX) powder 255 grams one bottle for colonoscopy prep  . sertraline (ZOLOFT) 50 MG tablet TAKE 1 TABLET BY MOUTH EVERY DAY  . simvastatin (ZOCOR) 40 MG tablet TAKE 1 TABLET (40 MG TOTAL) BY MOUTH EVERY EVENING.  Marland Kitchen spironolactone (ALDACTONE) 50 MG tablet TAKE 1 TABLET (50 MG TOTAL) BY MOUTH DAILY.  . [DISCONTINUED] insulin NPH Human (NOVOLIN N) 100 UNIT/ML injection Inject 0.15 mLs (15 Units total) into the skin at bedtime.  . [DISCONTINUED] Insulin Pen Needle ([employer] PEN NEEDLES 31G) 31G X 8 MM MISC Inject insulin two times daily  . [DISCONTINUED] Insulin Pen Needle 32G X 6 MM  MISC Use  Daily with Levemir insulin  . hydrochlorothiazide (HYDRODIURIL) 25 MG tablet Take 1 tablet (25 mg total) by mouth daily.  . INS SYRINGE/NEEDLE 1CC/28G (B-D INSULIN SYRINGE 1CC/28G) 28G X 1/2" 1 ML MISC 1 Syringe by Does not apply route 2 (two) times daily.  Marland Kitchen losartan (COZAAR) 100 MG tablet Take 1 tablet (100 mg total) by mouth daily.     No orders of the defined types were placed in this encounter.    Return in about 2 weeks (around 03/28/2014).

## 2014-03-14 NOTE — Assessment & Plan Note (Addendum)
2 week follow up done without log of sugars (left it on table)  With improvement in fastings since starting NPH 15 unis qhs.  Advised to add 5 units qam..  Asked to check sugars at 3pm or pre dinner and return in 2 weeks.

## 2014-03-14 NOTE — Assessment & Plan Note (Signed)
Elevated due to white coat hypertension.  Home bps have been

## 2014-03-14 NOTE — Assessment & Plan Note (Signed)
Home bps are better than here but ot at [facility]  Adding hctz 25 mg dailyu.

## 2014-03-14 NOTE — Patient Instructions (Signed)
I am adding a morning dose of insulin ,  5 units.  Continue 15 units at Bedtime  You can stop checking your fasting sugars and start checking your sugar around 3 PM or right before dinner meal  I am adding HCTZ 25 mg daily for your blood pressure.  Take it in the morning  It can be combined with losartan as a combination pill when you are due for losartan refill  Goal BP is 130/80 or less  Return in 2 weeks with your blood sugar log

## 2014-03-15 ENCOUNTER — Telehealth: Payer: Self-pay | Admitting: Internal Medicine

## 2014-03-15 NOTE — Telephone Encounter (Signed)
Relevant patient education mailed to patient.  

## 2014-03-28 ENCOUNTER — Other Ambulatory Visit: Payer: Self-pay | Admitting: Internal Medicine

## 2014-03-30 ENCOUNTER — Encounter (INDEPENDENT_AMBULATORY_CARE_PROVIDER_SITE_OTHER): Payer: Self-pay

## 2014-03-30 ENCOUNTER — Ambulatory Visit (INDEPENDENT_AMBULATORY_CARE_PROVIDER_SITE_OTHER): Payer: PRIVATE HEALTH INSURANCE | Admitting: Internal Medicine

## 2014-03-30 ENCOUNTER — Encounter: Payer: Self-pay | Admitting: Internal Medicine

## 2014-03-30 VITALS — BP 178/78 | HR 75 | Temp 98.0°F | Resp 16 | Wt 136.2 lb

## 2014-03-30 DIAGNOSIS — E1165 Type 2 diabetes mellitus with hyperglycemia: Secondary | ICD-10-CM

## 2014-03-30 DIAGNOSIS — IMO0002 Reserved for concepts with insufficient information to code with codable children: Secondary | ICD-10-CM

## 2014-03-30 DIAGNOSIS — IMO0001 Reserved for inherently not codable concepts without codable children: Secondary | ICD-10-CM

## 2014-03-30 DIAGNOSIS — I10 Essential (primary) hypertension: Secondary | ICD-10-CM

## 2014-03-30 NOTE — Patient Instructions (Signed)
  Your sugars are much better.,   Increase NPH at bedtime to 16 units  Increase the morning NPH to 8 units    Continue to check fasting and post lunch or post dinner  (total of twice daily)  Next A1c is not due until on or after June 4  Return for  labs on June 4th and an appt Jun 5th    Come back next week for  RN visit and bring your blood pressure cuff  So we can verify that home readings are true

## 2014-03-30 NOTE — Assessment & Plan Note (Addendum)
At last 2 week follow up she had  improvement in fastings since starting NPH 15 units qhs.  She was Advised to add 5 units qam..   Today we are increasing nph to 8 untis qzm and 16 units qpm .  Return on or after June 8th and repeat A1c due then.

## 2014-03-30 NOTE — Assessment & Plan Note (Signed)
Home bps are 123/68   She may have white coat syndrome ans has been asked to return with her home BP machine next week for an RN visit

## 2014-03-30 NOTE — Progress Notes (Signed)
Pre-visit discussion using our clinic review tool. No additional management support is needed unless otherwise documented below in the visit note.  

## 2014-04-01 ENCOUNTER — Other Ambulatory Visit: Payer: Self-pay | Admitting: Internal Medicine

## 2014-04-01 MED ORDER — INSULIN NPH (HUMAN) (ISOPHANE) 100 UNIT/ML ~~LOC~~ SUSP: SUBCUTANEOUS | Status: DC

## 2014-04-01 NOTE — Progress Notes (Signed)
Patient ID: Julie Dominguez, female   DOB: [birth year], 64 y.o.   MRN: [id number]  Patient Active Problem List   Diagnosis Date Noted  . Special screening for malignant neoplasms, colon 02/21/2014  . Noncompliance with diabetes treatment 01/31/2014  . Screening for breast cancer 03/02/2013  . Screening for colon cancer 03/02/2013  . Screening for cervical cancer 03/02/2013  . GAD (generalized anxiety disorder) 05/08/2012  . Cataract associated with another disorder 05/08/2012  . Diabetes mellitus type II, uncontrolled 05/01/2012  . Accelerated hypertension 04/25/2012  . Hypertension   . Hyperlipidemia     Subjective:  CC:   Chief Complaint  Patient presents with  . Follow-up  . Diabetes    HPI:   Julie Dominguez is a 63 y.o. female who presents for 2 week follow up on uncontrolled diabetes. Sugars are improving with biweekly titration of insulin and she has not been having any low blood sugars.  At last visit 5 units of NPH was added to am, and NPH 15 units continued at bedtime.  She has  modified her diet to eliminate excess starches, specifically cereal. Asked to check sugars at 3pm or pre dinner and return in 2 weeks. For the last 3 days  Has checked fastings and post lunch,  Fastings are < 150   Post lunch 150 to 180   And post dinner 156 (one check)   Past Medical History  Diagnosis Date  . Diabetes mellitus 2007  . Hypertension 1988    uncontrolled   . Hyperlipidemia     Past Surgical History  Procedure Laterality Date  . Eye surgery Bilateral August 2013, Oct 2013    catraract with lens implant dingledein        The following portions of the patient's history were reviewed and updated as appropriate: Allergies, current medications, and problem list.    Review of Systems:   Patient denies headache, fevers, malaise, unintentional weight loss, skin rash, eye pain, sinus congestion and sinus pain, sore throat, dysphagia,  hemoptysis , cough, dyspnea, wheezing,  chest pain, palpitations, orthopnea, edema, abdominal pain, nausea, melena, diarrhea, constipation, flank pain, dysuria, hematuria, urinary  Frequency, nocturia, numbness, tingling, seizures,  Focal weakness, Loss of consciousness,  Tremor, insomnia, depression, anxiety, and suicidal ideation.     History   Social History  . Marital Status: Single    Spouse Name: N/A    Number of Children: N/A  . Years of Education: N/A   Occupational History  . Not on file.   Social History Main Topics  . Smoking status: Never Smoker   . Smokeless tobacco: Never Used  . Alcohol Use: No  . Drug Use: No  . Sexual Activity: Not Currently   Other Topics Concern  . Not on file   Social History Narrative  . No narrative on file    Objective:  Filed Vitals:   03/30/14 1023  BP: 178/78  Pulse: 75  Temp: 98 F (36.7 C)  Resp: 16     General appearance: alert, cooperative and appears stated age Ears: normal TM's and external ear canals both ears Throat: lips, mucosa, and tongue normal; teeth and gums normal Neck: no adenopathy, no carotid bruit, supple, symmetrical, trachea midline and thyroid not enlarged, symmetric, no tenderness/mass/nodules Back: symmetric, no curvature. ROM normal. No CVA tenderness. Lungs: clear to auscultation bilaterally Heart: regular rate and rhythm, S1, S2 normal, no murmur, click, rub or gallop Abdomen: soft, non-tender; bowel sounds normal; no masses,  no organomegaly  Pulses: 2+ and symmetric Skin: Skin color, texture, turgor normal. No rashes or lesions Lymph nodes: Cervical, supraclavicular, and axillary nodes normal.  Assessment and Plan:  Diabetes mellitus type II, uncontrolled At last 2 week follow up she had  improvement in fastings since starting NPH 15 units qhs.  She was Advised to add 5 units qam..   Today we are increasing nph to 8 untis qzm and 16 units qpm .  Return on or after June 8th and repeat A1c due then.     Hypertension Home bps are  123/68   She may have white coat syndrome ans has been asked to return with her home BP machine next week for an RN visit    Updated Medication List Outpatient Encounter Prescriptions as of 03/30/2014  Medication Sig  . amLODipine (NORVASC) 10 MG tablet TAKE 1 TABLET (10 MG TOTAL) BY MOUTH DAILY.  . cloNIDine (CATAPRES) 0.2 MG tablet TAKE 1 TABLET BY MOUTH TWICE A DAY  . glipiZIDE (GLUCOTROL) 10 MG tablet Take 1 tablet (10 mg total) by mouth 2 (two) times daily before a meal.  . hydrochlorothiazide (HYDRODIURIL) 25 MG tablet TAKE 1 TABLET BY MOUTH EVERY DAY  . hydrochlorothiazide (HYDRODIURIL) 25 MG tablet Take 1 tablet (25 mg total) by mouth daily.  Marland Kitchen ibuprofen (ADVIL,MOTRIN) 200 MG tablet Take 200 mg by mouth as needed.  . INS SYRINGE/NEEDLE 1CC/28G (B-D INSULIN SYRINGE 1CC/28G) 28G X 1/2" 1 ML MISC 1 Syringe by Does not apply route 2 (two) times daily.  . insulin NPH Human (NOVOLIN N) 100 UNIT/ML injection 16 units at bedtime,  8 units at breakfast  . metFORMIN (GLUCOPHAGE) 1000 MG tablet Take 1 tablet (1,000 mg total) by mouth 2 (two) times daily with a meal.  . ONE TOUCH ULTRA TEST test strip TEST SUGAR TWICE A DAY  . polyethylene glycol powder (GLYCOLAX/MIRALAX) powder 255 grams one bottle for colonoscopy prep  . sertraline (ZOLOFT) 50 MG tablet TAKE 1 TABLET BY MOUTH EVERY DAY  . simvastatin (ZOCOR) 40 MG tablet TAKE 1 TABLET (40 MG TOTAL) BY MOUTH EVERY EVENING.  Marland Kitchen spironolactone (ALDACTONE) 50 MG tablet TAKE 1 TABLET (50 MG TOTAL) BY MOUTH DAILY.  . [DISCONTINUED] insulin NPH Human (NOVOLIN N) 100 UNIT/ML injection 15 units at bedtime,  5 units at breakfast  . losartan (COZAAR) 100 MG tablet Take 1 tablet (100 mg total) by mouth daily.

## 2014-04-06 ENCOUNTER — Ambulatory Visit (INDEPENDENT_AMBULATORY_CARE_PROVIDER_SITE_OTHER): Payer: PRIVATE HEALTH INSURANCE | Admitting: *Deleted

## 2014-04-06 VITALS — BP 164/84

## 2014-04-06 DIAGNOSIS — I10 Essential (primary) hypertension: Secondary | ICD-10-CM

## 2014-04-06 NOTE — Progress Notes (Signed)
   Subjective:    Patient ID: Julie Dominguez, female    DOB: 1951/02/06, 63 y.o.   MRN: [id number]  HPI    Review of Systems     Objective:   Physical Exam        Assessm/ent & Plan:  Patient came in for blood pressure check today. Blood pressure taken with automatic machine with a reading of 188/79 in her left arm while sitting. Patient then took her BP with her own machine she uses at home to check blood pressure. She got a reading of 160 something over 80, she was using a manual cuff and released it pretty quickly, not sure if she was reading it correctly. I then took her blood pressure again manually using clinic cuff, I got a reading of 164/84. Patient has been checking her BP at home but not keeping a record of what her numbers has been running.

## 2014-04-12 ENCOUNTER — Telehealth: Payer: Self-pay

## 2014-04-12 NOTE — Telephone Encounter (Signed)
Message left for patient to call back to review colonoscopy instructions and to see about any changes in health or medication status.

## 2014-04-16 ENCOUNTER — Telehealth: Payer: Self-pay | Admitting: *Deleted

## 2014-04-16 NOTE — Telephone Encounter (Signed)
Colonoscopy instructions reviewed with patient and she is in full understanding of her instructions.

## 2014-04-16 NOTE — Telephone Encounter (Signed)
Pt on Metformin 1000 mg bid

## 2014-04-16 NOTE — Telephone Encounter (Signed)
REFILL REQUEST FOR METFORMIN 500 MG   BID WITH MEALS.

## 2014-04-16 NOTE — Telephone Encounter (Signed)
Pt is calling you back about colonoscopy instructions

## 2014-04-17 ENCOUNTER — Other Ambulatory Visit: Payer: Self-pay | Admitting: General Surgery

## 2014-04-17 DIAGNOSIS — Z1211 Encounter for screening for malignant neoplasm of colon: Secondary | ICD-10-CM

## 2014-04-17 MED ORDER — METFORMIN HCL 500 MG PO TABS: 500.0000 mg | ORAL_TABLET | Freq: Two times a day (BID) | ORAL | Status: DC

## 2014-04-17 NOTE — Telephone Encounter (Signed)
Called to clarify with pt. She states she is taking Metformin 500 mg bid, not 1000 mg bid. Rx sent to pharmacy by escript

## 2014-04-17 NOTE — Addendum Note (Signed)
Addended by: Wynonia Lawman E on: 04/17/2014 03:44 PM   Modules accepted: Orders

## 2014-04-18 ENCOUNTER — Ambulatory Visit: Payer: Self-pay | Admitting: General Surgery

## 2014-04-18 DIAGNOSIS — D128 Benign neoplasm of rectum: Secondary | ICD-10-CM

## 2014-04-18 DIAGNOSIS — D129 Benign neoplasm of anus and anal canal: Secondary | ICD-10-CM

## 2014-04-18 DIAGNOSIS — Z1211 Encounter for screening for malignant neoplasm of colon: Secondary | ICD-10-CM

## 2014-04-18 LAB — HM COLONOSCOPY

## 2014-04-19 ENCOUNTER — Encounter: Payer: Self-pay | Admitting: General Surgery

## 2014-04-19 ENCOUNTER — Telehealth: Payer: Self-pay | Admitting: General Surgery

## 2014-04-19 LAB — PATHOLOGY REPORT

## 2014-04-19 NOTE — Telephone Encounter (Signed)
Notified pathology on sigmoid polyp was benign.  A follow up exam in five years would be appropriate.

## 2014-04-24 ENCOUNTER — Encounter: Payer: Self-pay | Admitting: General Surgery

## 2014-05-02 ENCOUNTER — Telehealth: Payer: Self-pay | Admitting: *Deleted

## 2014-05-02 DIAGNOSIS — IMO0001 Reserved for inherently not codable concepts without codable children: Secondary | ICD-10-CM

## 2014-05-02 DIAGNOSIS — E1165 Type 2 diabetes mellitus with hyperglycemia: Principal | ICD-10-CM

## 2014-05-02 NOTE — Telephone Encounter (Signed)
Pt is coming in tomorrow what labs and dx?  

## 2014-05-03 ENCOUNTER — Other Ambulatory Visit (INDEPENDENT_AMBULATORY_CARE_PROVIDER_SITE_OTHER): Payer: PRIVATE HEALTH INSURANCE

## 2014-05-03 ENCOUNTER — Encounter (INDEPENDENT_AMBULATORY_CARE_PROVIDER_SITE_OTHER): Payer: Self-pay

## 2014-05-03 DIAGNOSIS — E1165 Type 2 diabetes mellitus with hyperglycemia: Principal | ICD-10-CM

## 2014-05-03 DIAGNOSIS — IMO0001 Reserved for inherently not codable concepts without codable children: Secondary | ICD-10-CM

## 2014-05-03 LAB — COMPREHENSIVE METABOLIC PANEL
ALT: 18 U/L (ref 0–35)
AST: 16 U/L (ref 0–37)
Albumin: 4.4 g/dL (ref 3.5–5.2)
Alkaline Phosphatase: 97 U/L (ref 39–117)
BILIRUBIN TOTAL: 0.6 mg/dL (ref 0.2–1.2)
BUN: 41 mg/dL — ABNORMAL HIGH (ref 6–23)
CALCIUM: 9.8 mg/dL (ref 8.4–10.5)
CHLORIDE: 105 meq/L (ref 96–112)
CO2: 27 meq/L (ref 19–32)
Creatinine, Ser: 1.2 mg/dL (ref 0.4–1.2)
GFR: 60.93 mL/min (ref >60.00)
GLUCOSE: 129 mg/dL — AB (ref 70–99)
Potassium: 4.8 mEq/L (ref 3.5–5.1)
Sodium: 138 mEq/L (ref 135–145)
TOTAL PROTEIN: 8.4 g/dL — AB (ref 6.0–8.3)

## 2014-05-03 LAB — LIPID PANEL
Cholesterol: 151 mg/dL (ref 0–200)
HDL: 45.6 mg/dL (ref >39.00)
LDL Cholesterol: 91 mg/dL (ref 0–99)
NONHDL: 105.4
TRIGLYCERIDES: 72 mg/dL (ref 0.0–149.0)
Total CHOL/HDL Ratio: 3
VLDL: 14.4 mg/dL (ref 0.0–40.0)

## 2014-05-03 LAB — MICROALBUMIN / CREATININE URINE RATIO
Creatinine,U: 81.3 mg/dL
MICROALB/CREAT RATIO: 0.4 mg/g (ref 0.0–30.0)
Microalb, Ur: 0.3 mg/dL (ref 0.0–1.9)

## 2014-05-03 LAB — HEMOGLOBIN A1C: HEMOGLOBIN A1C: 8.3 % — AB (ref 4.6–6.5)

## 2014-05-04 ENCOUNTER — Ambulatory Visit: Payer: PRIVATE HEALTH INSURANCE | Admitting: Internal Medicine

## 2014-05-08 ENCOUNTER — Encounter: Payer: Self-pay | Admitting: Internal Medicine

## 2014-05-08 ENCOUNTER — Ambulatory Visit (INDEPENDENT_AMBULATORY_CARE_PROVIDER_SITE_OTHER): Payer: PRIVATE HEALTH INSURANCE | Admitting: Internal Medicine

## 2014-05-08 VITALS — BP 168/80 | HR 72 | Temp 98.0°F | Resp 18 | Ht 63.0 in | Wt 139.0 lb

## 2014-05-08 DIAGNOSIS — IMO0002 Reserved for concepts with insufficient information to code with codable children: Secondary | ICD-10-CM

## 2014-05-08 DIAGNOSIS — IMO0001 Reserved for inherently not codable concepts without codable children: Secondary | ICD-10-CM

## 2014-05-08 DIAGNOSIS — E1165 Type 2 diabetes mellitus with hyperglycemia: Secondary | ICD-10-CM

## 2014-05-08 DIAGNOSIS — I10 Essential (primary) hypertension: Secondary | ICD-10-CM

## 2014-05-08 NOTE — Assessment & Plan Note (Signed)
Now overly controlled with frequent hypoglycemic events.   I hav reduced her night time insulin to 10 units and her am dose to 6 untis

## 2014-05-08 NOTE — Patient Instructions (Signed)
Your blood sugars are much lower now and we need to back off a little on the doses of insulin   Reduce the evening dose of NPH to  10 units and the morning dose dose to 6 units   Return in two weeks   If your evening sugars is > 160 (because of dinner)  Increase the evening dose to 12 units  For that night  Ok to eat "no sugar added"  Ice cream in moderate quantities.   Check your BP once a week at [facility]

## 2014-05-08 NOTE — Progress Notes (Signed)
Patient ID: Julie Dominguez, female   DOB: 05-21-1951, 63 y.o.   MRN: [id number]  Patient Active Problem List   Diagnosis Date Noted  . Special screening for malignant neoplasms, colon 02/21/2014  . Noncompliance with diabetes treatment 01/31/2014  . Screening for breast cancer 03/02/2013  . Screening for colon cancer 03/02/2013  . Screening for cervical cancer 03/02/2013  . GAD (generalized anxiety disorder) 05/08/2012  . Cataract associated with another disorder 05/08/2012  . Diabetes mellitus type II, uncontrolled 05/01/2012  . Accelerated hypertension 04/25/2012  . Hypertension   . Hyperlipidemia     Subjective:  CC:   Chief Complaint  Patient presents with  . Follow-up  . Diabetes    HPI:   Julie Dominguez is a 63 y.o. female who presents for  2 week follow up on uncontrolled DM.  Repeat A1c was done and had improved to 8.3  Using insulin twice daily  8 units in the am and 16 units at bedtime .  Has had frequent hypoglycemic events both early am and in the evening. But hs not called or suspended her insulin, following a low glycemic index diet.    Past Medical History  Diagnosis Date  . Diabetes mellitus 2007  . Hypertension 1988    uncontrolled   . Hyperlipidemia     Past Surgical History  Procedure Laterality Date  . Eye surgery Bilateral August 2013, Oct 2013    catraract with lens implant dingledein        The following portions of the patient's history were reviewed and updated as appropriate: Allergies, current medications, and problem list.    Review of Systems:   Patient denies headache, fevers, malaise, unintentional weight loss, skin rash, eye pain, sinus congestion and sinus pain, sore throat, dysphagia,  hemoptysis , cough, dyspnea, wheezing, chest pain, palpitations, orthopnea, edema, abdominal pain, nausea, melena, diarrhea, constipation, flank pain, dysuria, hematuria, urinary  Frequency, nocturia, numbness, tingling, seizures,  Focal weakness,  Loss of consciousness,  Tremor, insomnia, depression, anxiety, and suicidal ideation.     History   Social History  . Marital Status: Single    Spouse Name: N/A    Number of Children: N/A  . Years of Education: N/A   Occupational History  . Not on file.   Social History Main Topics  . Smoking status: Never Smoker   . Smokeless tobacco: Never Used  . Alcohol Use: No  . Drug Use: No  . Sexual Activity: Not Currently   Other Topics Concern  . Not on file   Social History Narrative  . No narrative on file    Objective:  Filed Vitals:   05/08/14 0954  BP: 168/80  Pulse: 72  Temp: 98 F (36.7 C)  Resp: 18     General appearance: alert, cooperative and appears stated age Ears: normal TM's and external ear canals both ears Throat: lips, mucosa, and tongue normal; teeth and gums normal Neck: no adenopathy, no carotid bruit, supple, symmetrical, trachea midline and thyroid not enlarged, symmetric, no tenderness/mass/nodules Back: symmetric, no curvature. ROM normal. No CVA tenderness. Lungs: clear to auscultation bilaterally Heart: regular rate and rhythm, S1, S2 normal, no murmur, click, rub or gallop Abdomen: soft, non-tender; bowel sounds normal; no masses,  no organomegaly Pulses: 2+ and symmetric Skin: Skin color, texture, turgor normal. No rashes or lesions Lymph nodes: Cervical, supraclavicular, and axillary nodes normal.  Assessment and Plan:  Diabetes mellitus type II, uncontrolled Now overly controlled with frequent hypoglycemic events.  I hav reduced her night time insulin to 10 units and her am dose to 6 untis   Hypertension Home bps are 123/68   I have asked her to chek it weekly at the drug store.      Updated Medication List Outpatient Encounter Prescriptions as of 05/08/2014  Medication Sig  . amLODipine (NORVASC) 10 MG tablet TAKE 1 TABLET (10 MG TOTAL) BY MOUTH DAILY.  . cloNIDine (CATAPRES) 0.2 MG tablet TAKE 1 TABLET BY MOUTH TWICE A DAY  .  glipiZIDE (GLUCOTROL) 10 MG tablet Take 1 tablet (10 mg total) by mouth 2 (two) times daily before a meal.  . hydrochlorothiazide (HYDRODIURIL) 25 MG tablet TAKE 1 TABLET BY MOUTH EVERY DAY  . hydrochlorothiazide (HYDRODIURIL) 25 MG tablet Take 1 tablet (25 mg total) by mouth daily.  Marland Kitchen ibuprofen (ADVIL,MOTRIN) 200 MG tablet Take 200 mg by mouth as needed.  . INS SYRINGE/NEEDLE 1CC/28G (B-D INSULIN SYRINGE 1CC/28G) 28G X 1/2" 1 ML MISC 1 Syringe by Does not apply route 2 (two) times daily.  . insulin NPH Human (NOVOLIN N) 100 UNIT/ML injection 16 units at bedtime,  8 units at breakfast  . metFORMIN (GLUCOPHAGE) 500 MG tablet Take 1 tablet (500 mg total) by mouth 2 (two) times daily with a meal.  . ONE TOUCH ULTRA TEST test strip TEST SUGAR TWICE A DAY  . polyethylene glycol powder (GLYCOLAX/MIRALAX) powder 255 grams one bottle for colonoscopy prep  . sertraline (ZOLOFT) 50 MG tablet TAKE 1 TABLET BY MOUTH EVERY DAY  . simvastatin (ZOCOR) 40 MG tablet TAKE 1 TABLET (40 MG TOTAL) BY MOUTH EVERY EVENING.  Marland Kitchen spironolactone (ALDACTONE) 50 MG tablet TAKE 1 TABLET (50 MG TOTAL) BY MOUTH DAILY.  Marland Kitchen losartan (COZAAR) 100 MG tablet Take 1 tablet (100 mg total) by mouth daily.     No orders of the defined types were placed in this encounter.    Return in about 2 weeks (around 05/22/2014).

## 2014-05-08 NOTE — Progress Notes (Signed)
Pre-visit discussion using our clinic review tool. No additional management support is needed unless otherwise documented below in the visit note.  

## 2014-05-08 NOTE — Assessment & Plan Note (Signed)
Home bps are 123/68   I have asked her to chek it weekly at the drug store.

## 2014-05-17 ENCOUNTER — Other Ambulatory Visit: Payer: Self-pay | Admitting: Internal Medicine

## 2014-05-17 NOTE — Telephone Encounter (Signed)
Ok to refill,  Refill sent  

## 2014-05-17 NOTE — Telephone Encounter (Signed)
Last OV 6.9.15, last refill 3.26.15.  Please advise refill

## 2014-05-23 ENCOUNTER — Ambulatory Visit: Payer: PRIVATE HEALTH INSURANCE | Admitting: Internal Medicine

## 2014-05-23 ENCOUNTER — Other Ambulatory Visit: Payer: Self-pay | Admitting: Internal Medicine

## 2014-05-24 ENCOUNTER — Other Ambulatory Visit: Payer: Self-pay | Admitting: Internal Medicine

## 2014-05-25 NOTE — Telephone Encounter (Signed)
Ok to fill 

## 2014-05-29 ENCOUNTER — Other Ambulatory Visit: Payer: Self-pay | Admitting: Internal Medicine

## 2014-05-29 NOTE — Telephone Encounter (Signed)
Done on jun 18

## 2014-06-06 ENCOUNTER — Other Ambulatory Visit: Payer: Self-pay | Admitting: Internal Medicine

## 2014-06-06 ENCOUNTER — Ambulatory Visit (INDEPENDENT_AMBULATORY_CARE_PROVIDER_SITE_OTHER): Payer: PRIVATE HEALTH INSURANCE | Admitting: Internal Medicine

## 2014-06-06 ENCOUNTER — Encounter: Payer: Self-pay | Admitting: Internal Medicine

## 2014-06-06 VITALS — BP 178/86 | HR 83 | Temp 98.5°F | Resp 16 | Ht 63.0 in | Wt 135.8 lb

## 2014-06-06 DIAGNOSIS — Z1239 Encounter for other screening for malignant neoplasm of breast: Secondary | ICD-10-CM

## 2014-06-06 DIAGNOSIS — IMO0001 Reserved for inherently not codable concepts without codable children: Secondary | ICD-10-CM

## 2014-06-06 DIAGNOSIS — Z1211 Encounter for screening for malignant neoplasm of colon: Secondary | ICD-10-CM

## 2014-06-06 DIAGNOSIS — I10 Essential (primary) hypertension: Secondary | ICD-10-CM

## 2014-06-06 DIAGNOSIS — IMO0002 Reserved for concepts with insufficient information to code with codable children: Secondary | ICD-10-CM

## 2014-06-06 DIAGNOSIS — E1165 Type 2 diabetes mellitus with hyperglycemia: Secondary | ICD-10-CM

## 2014-06-06 LAB — HM DIABETES FOOT EXAM: HM Diabetic Foot Exam: NORMAL

## 2014-06-06 NOTE — Progress Notes (Signed)
Pre-visit discussion using our clinic review tool. No additional management support is needed unless otherwise documented below in the visit note.  

## 2014-06-06 NOTE — Progress Notes (Signed)
Patient ID: Julie Dominguez, female   DOB: June 07, 1951, 63 y.o.   MRN: [id number]   Patient Active Problem List   Diagnosis Date Noted  . Special screening for malignant neoplasms, colon 02/21/2014  . Noncompliance with diabetes treatment 01/31/2014  . Screening for breast cancer 03/02/2013  . Screening for colon cancer 03/02/2013  . Screening for cervical cancer 03/02/2013  . GAD (generalized anxiety disorder) 05/08/2012  . Cataract associated with another disorder 05/08/2012  . Diabetes mellitus type II, uncontrolled 05/01/2012  . White coat hypertension   . Hyperlipidemia     Subjective:  CC:   Chief Complaint  Patient presents with  . Follow-up  . Diabetes    HPI:   Julie Dominguez is a 63 y.o. female who presents for Follow up on uncontrolled DM.  last seen a month ago.  .  At that time her A1c had improved but was still > 8.0 ' Lab Results  Component Value Date   HGBA1C 8.3* 05/03/2014    fastings 97 to 113  And  Pre dinner sugars 92 to 131 .  She has not checked in several days because had trouble affording the strips and having to ration them.       Past Medical History  Diagnosis Date  . Diabetes mellitus 2007  . Hypertension 1988    uncontrolled   . Hyperlipidemia     Past Surgical History  Procedure Laterality Date  . Eye surgery Bilateral August 2013, Oct 2013    catraract with lens implant dingledein        The following portions of the patient's history were reviewed and updated as appropriate: Allergies, current medications, and problem list.    Review of Systems:   Patient denies headache, fevers, malaise, unintentional weight loss, skin rash, eye pain, sinus congestion and sinus pain, sore throat, dysphagia,  hemoptysis , cough, dyspnea, wheezing, chest pain, palpitations, orthopnea, edema, abdominal pain, nausea, melena, diarrhea, constipation, flank pain, dysuria, hematuria, urinary  Frequency, nocturia, numbness, tingling, seizures,  Focal  weakness, Loss of consciousness,  Tremor, insomnia, depression, anxiety, and suicidal ideation.     History   Social History  . Marital Status: Single    Spouse Name: N/A    Number of Children: N/A  . Years of Education: N/A   Occupational History  . Not on file.   Social History Main Topics  . Smoking status: Never Smoker   . Smokeless tobacco: Never Used  . Alcohol Use: No  . Drug Use: No  . Sexual Activity: Not Currently   Other Topics Concern  . Not on file   Social History Narrative  . No narrative on file    Objective:  Filed Vitals:   06/06/14 1535  BP: 178/86  Pulse: 83  Temp: 98.5 F (36.9 C)  Resp: 16     General appearance: alert, cooperative and appears stated age Ears: normal TM's and external ear canals both ears Throat: lips, mucosa, and tongue normal; teeth and gums normal Neck: no adenopathy, no carotid bruit, supple, symmetrical, trachea midline and thyroid not enlarged, symmetric, no tenderness/mass/nodules Back: symmetric, no curvature. ROM normal. No CVA tenderness. Lungs: clear to auscultation bilaterally Heart: regular rate and rhythm, S1, S2 normal, no murmur, click, rub or gallop Abdomen: soft, non-tender; bowel sounds normal; no masses,  no organomegaly Pulses: 2+ and symmetric Skin: Skin color, texture, turgor normal. No rashes or lesions Lymph nodes: Cervical, supraclavicular, and axillary nodes normal.  Assessment and Plan:  White coat hypertension Home blood pressures have been consistently < 140/80 .  No changes today  Diabetes mellitus type II, uncontrolled Now controlled with  Resolution of previously reported hypoglycemic events since her  night time insulin eas rediced to 10 units and her am dose to 6 untis     Special screening for malignant neoplasms, colon Last colonoscopy unknown, over 10 yrs ago per patient.,      Updated Medication List Outpatient Encounter Prescriptions as of 06/06/2014  Medication Sig  .  amLODipine (NORVASC) 10 MG tablet TAKE 1 TABLET (10 MG TOTAL) BY MOUTH DAILY.  Marland Kitchen glipiZIDE (GLUCOTROL) 10 MG tablet TAKE 1 TABLET BY MOUTH TWICE A DAY BEFORE MEALS  . hydrochlorothiazide (HYDRODIURIL) 25 MG tablet TAKE 1 TABLET BY MOUTH EVERY DAY  . ibuprofen (ADVIL,MOTRIN) 200 MG tablet Take 200 mg by mouth as needed.  . INS SYRINGE/NEEDLE 1CC/28G (B-D INSULIN SYRINGE 1CC/28G) 28G X 1/2" 1 ML MISC 1 Syringe by Does not apply route 2 (two) times daily.  . insulin NPH Human (NOVOLIN N) 100 UNIT/ML injection 16 units at bedtime,  8 units at breakfast  . losartan (COZAAR) 100 MG tablet TAKE 1 TABLET BY MOUTH EVERY DAY  . metFORMIN (GLUCOPHAGE) 500 MG tablet Take 1 tablet (500 mg total) by mouth 2 (two) times daily with a meal.  . ONE TOUCH ULTRA TEST test strip TEST SUGAR TWICE A DAY  . polyethylene glycol powder (GLYCOLAX/MIRALAX) powder 255 grams one bottle for colonoscopy prep  . sertraline (ZOLOFT) 50 MG tablet TAKE 1 TABLET BY MOUTH EVERY DAY  . simvastatin (ZOCOR) 40 MG tablet TAKE 1 TABLET (40 MG TOTAL) BY MOUTH EVERY EVENING.  Marland Kitchen spironolactone (ALDACTONE) 50 MG tablet TAKE 1 TABLET (50 MG TOTAL) BY MOUTH DAILY.  . [DISCONTINUED] cloNIDine (CATAPRES) 0.2 MG tablet TAKE 1 TABLET BY MOUTH TWICE A DAY  . losartan (COZAAR) 100 MG tablet Take 1 tablet (100 mg total) by mouth daily.  . [DISCONTINUED] hydrochlorothiazide (HYDRODIURIL) 25 MG tablet Take 1 tablet (25 mg total) by mouth daily.     Orders Placed This Encounter  Procedures  . HM COLONOSCOPY    Return in about 2 days (around 06/08/2014) for follow up diabetes.

## 2014-06-06 NOTE — Patient Instructions (Addendum)
Once you get more test strips,, please check your sugar once daily at different times    2 hrs after any meal    Continue to check bp once a week,  A good reading is < 140/80   please start taking a baby aspirin  We need to send you for a carotid doppler  Soon, so please work on your bill from [employer]

## 2014-06-09 ENCOUNTER — Encounter: Payer: Self-pay | Admitting: Internal Medicine

## 2014-06-09 LAB — HM DIABETES EYE EXAM

## 2014-06-09 NOTE — Assessment & Plan Note (Signed)
Home blood pressures have been consistently < 140/80 .  No changes today

## 2014-06-09 NOTE — Assessment & Plan Note (Signed)
Now controlled with  Resolution of previously reported hypoglycemic events since her  night time insulin eas rediced to 10 units and her am dose to 6 untis

## 2014-06-09 NOTE — Assessment & Plan Note (Signed)
Last colonoscopy unknown, over 10 yrs ago per patient.,

## 2014-06-22 ENCOUNTER — Encounter: Payer: Self-pay | Admitting: *Deleted

## 2014-06-22 NOTE — Progress Notes (Signed)
Chart reviewed for DM bundle. Last OV 06/16/14, appt sch 08/29/14. Last labs 05/03/14

## 2014-06-26 ENCOUNTER — Telehealth: Payer: Self-pay | Admitting: *Deleted

## 2014-06-26 NOTE — Telephone Encounter (Signed)
Pt called states she has been waiting on Diabetic Medical Supplies to come from [facility], states [facility] is waiting on order from [facility].  I spoke with Lelon Frohlich at [facility] she states she is faxing the request back to [place].  Ann further states form is to be filled out completely.

## 2014-07-06 ENCOUNTER — Other Ambulatory Visit: Payer: Self-pay | Admitting: Internal Medicine

## 2014-07-12 ENCOUNTER — Telehealth: Payer: Self-pay | Admitting: Internal Medicine

## 2014-07-12 NOTE — Telephone Encounter (Signed)
Spoke with pt, advised her [facility] has not received fax from [facility].  Requested she call [employer] again to refax to [phone].

## 2014-07-12 NOTE — Telephone Encounter (Signed)
Pt called in and stated she is in need of her diabetic supplies and stated she spoke with the company she is getting them from this morning and they stated they have faxed [place] over the form but they are just waiting on [facility] to fill them out and be faxed back. Pt would like a call back.

## 2014-07-19 NOTE — Telephone Encounter (Signed)
Rx request faxed back to [facility].

## 2014-08-21 ENCOUNTER — Other Ambulatory Visit: Payer: Self-pay | Admitting: Internal Medicine

## 2014-08-24 ENCOUNTER — Telehealth: Payer: Self-pay | Admitting: *Deleted

## 2014-08-24 DIAGNOSIS — E119 Type 2 diabetes mellitus without complications: Secondary | ICD-10-CM

## 2014-08-24 NOTE — Telephone Encounter (Signed)
Pt is coming in Monday for labs what labs and dx?

## 2014-08-24 NOTE — Addendum Note (Signed)
Addended by: Crecencio Mc on: 08/24/2014 02:09 PM   Modules accepted: Orders

## 2014-08-27 ENCOUNTER — Other Ambulatory Visit (INDEPENDENT_AMBULATORY_CARE_PROVIDER_SITE_OTHER): Payer: PRIVATE HEALTH INSURANCE

## 2014-08-27 DIAGNOSIS — E119 Type 2 diabetes mellitus without complications: Secondary | ICD-10-CM

## 2014-08-27 LAB — COMPREHENSIVE METABOLIC PANEL
ALK PHOS: 119 U/L — AB (ref 39–117)
ALT: 21 U/L (ref 0–35)
AST: 21 U/L (ref 0–37)
Albumin: 4.3 g/dL (ref 3.5–5.2)
BILIRUBIN TOTAL: 0.6 mg/dL (ref 0.2–1.2)
BUN: 32 mg/dL — ABNORMAL HIGH (ref 6–23)
CO2: 28 mEq/L (ref 19–32)
CREATININE: 1 mg/dL (ref 0.4–1.2)
Calcium: 9.3 mg/dL (ref 8.4–10.5)
Chloride: 100 mEq/L (ref 96–112)
GFR: 73.21 mL/min (ref >60.00)
GLUCOSE: 121 mg/dL — AB (ref 70–99)
Potassium: 4.2 mEq/L (ref 3.5–5.1)
SODIUM: 136 meq/L (ref 135–145)
TOTAL PROTEIN: 8.4 g/dL — AB (ref 6.0–8.3)

## 2014-08-27 LAB — HEMOGLOBIN A1C: Hgb A1c MFr Bld: 7.9 % — ABNORMAL HIGH (ref 4.6–6.5)

## 2014-08-29 ENCOUNTER — Encounter: Payer: Self-pay | Admitting: Internal Medicine

## 2014-08-29 ENCOUNTER — Ambulatory Visit (INDEPENDENT_AMBULATORY_CARE_PROVIDER_SITE_OTHER): Payer: PRIVATE HEALTH INSURANCE | Admitting: Internal Medicine

## 2014-08-29 VITALS — BP 158/90 | HR 80 | Temp 98.2°F | Resp 14 | Ht 63.0 in | Wt 135.8 lb

## 2014-08-29 DIAGNOSIS — IMO0001 Reserved for inherently not codable concepts without codable children: Secondary | ICD-10-CM

## 2014-08-29 DIAGNOSIS — Z23 Encounter for immunization: Secondary | ICD-10-CM

## 2014-08-29 DIAGNOSIS — E785 Hyperlipidemia, unspecified: Secondary | ICD-10-CM

## 2014-08-29 DIAGNOSIS — R030 Elevated blood-pressure reading, without diagnosis of hypertension: Secondary | ICD-10-CM

## 2014-08-29 DIAGNOSIS — IMO0002 Reserved for concepts with insufficient information to code with codable children: Secondary | ICD-10-CM

## 2014-08-29 DIAGNOSIS — E1165 Type 2 diabetes mellitus with hyperglycemia: Secondary | ICD-10-CM

## 2014-08-29 MED ORDER — METFORMIN HCL 850 MG PO TABS: 850.0000 mg | ORAL_TABLET | Freq: Two times a day (BID) | ORAL | Status: DC

## 2014-08-29 MED ORDER — TETANUS-DIPHTH-ACELL PERTUSSIS 5-2.5-18.5 LF-MCG/0.5 IM SUSP: 0.5000 mL | Freq: Once | INTRAMUSCULAR | Status: DC

## 2014-08-29 MED ORDER — HYDROCHLOROTHIAZIDE 25 MG PO TABS: 25.0000 mg | ORAL_TABLET | Freq: Every day | ORAL | Status: DC

## 2014-08-29 MED ORDER — INSULIN NPH (HUMAN) (ISOPHANE) 100 UNIT/ML ~~LOC~~ SUSP: SUBCUTANEOUS | Status: DC

## 2014-08-29 NOTE — Progress Notes (Signed)
Pre-visit discussion using our clinic review tool. No additional management support is needed unless otherwise documented below in the visit note.  

## 2014-08-29 NOTE — Progress Notes (Signed)
Patient ID: Julie Dominguez, female   DOB: 09/08/1951, 63 y.o.   MRN: [id number]    Patient Active Problem List   Diagnosis Date Noted  . Special screening for malignant neoplasms, colon 02/21/2014  . Screening for breast cancer 03/02/2013  . Screening for colon cancer 03/02/2013  . Screening for cervical cancer 03/02/2013  . GAD (generalized anxiety disorder) 05/08/2012  . Cataract associated with another disorder 05/08/2012  . Diabetes mellitus type II, uncontrolled 05/01/2012  . White coat hypertension   . Hyperlipidemia     Subjective:  CC:   Chief Complaint  Patient presents with  . Follow-up  . Diabetes    HPI:   Julie Dominguez is a 63 y.o. female who presents for  2 month follow up on DM type 2 uncontrolled, hypertension.  She is taking NPH insulin as directed twice daily,  But it is costing her $35-40 /month .  She has missed a few doses but not many.   she is having financial difficulties .  She  denies any low blood sugars.  Does not feel deprived on her low glycemic index diet. Denies neuropathy, chestpain, or vision changes    Past Medical History  Diagnosis Date  . Diabetes mellitus 2007  . Hypertension 1988    uncontrolled   . Hyperlipidemia     Past Surgical History  Procedure Laterality Date  . Eye surgery Bilateral August 2013, Oct 2013    catraract with lens implant dingledein        The following portions of the patient's history were reviewed and updated as appropriate: Allergies, current medications, and problem list.    Review of Systems:   Patient denies headache, fevers, malaise, unintentional weight loss, skin rash, eye pain, sinus congestion and sinus pain, sore throat, dysphagia,  hemoptysis , cough, dyspnea, wheezing, chest pain, palpitations, orthopnea, edema, abdominal pain, nausea, melena, diarrhea, constipation, flank pain, dysuria, hematuria, urinary  Frequency, nocturia, numbness, tingling, seizures,  Focal weakness, Loss of  consciousness,  Tremor, insomnia, depression, anxiety, and suicidal ideation.     History   Social History  . Marital Status: Single    Spouse Name: N/A    Number of Children: N/A  . Years of Education: N/A   Occupational History  . Not on file.   Social History Main Topics  . Smoking status: Never Smoker   . Smokeless tobacco: Never Used  . Alcohol Use: No  . Drug Use: No  . Sexual Activity: Not Currently   Other Topics Concern  . Not on file   Social History Narrative  . No narrative on file    Objective:  Filed Vitals:   08/29/14 0929  BP: 158/90  Pulse: 80  Temp: 98.2 F (36.8 C)  Resp: 14     General appearance: alert, cooperative and appears stated age Ears: normal TM's and external ear canals both ears Throat: lips, mucosa, and tongue normal; teeth and gums normal Neck: no adenopathy, no carotid bruit, supple, symmetrical, trachea midline and thyroid not enlarged, symmetric, no tenderness/mass/nodules Back: symmetric, no curvature. ROM normal. No CVA tenderness. Lungs: clear to auscultation bilaterally Heart: regular rate and rhythm, S1, S2 normal, no murmur, click, rub or gallop Abdomen: soft, non-tender; bowel sounds normal; no masses,  no organomegaly Pulses: 2+ and symmetric Skin: Skin color, texture, turgor normal. No rashes or lesions Lymph nodes: Cervical, supraclavicular, and axillary nodes normal.  Assessment and Plan:  White coat hypertension Her bp is improving but still not  at goal  On 4 medications. Patient is adamant about compliance but i am not seeing a refill on clonidine and spironolactone. ,  ADDING HCTZ 25 MG TODAY  Lab Results  Component Value Date   CREATININE 1.0 08/27/2014   Lab Results  Component Value Date   NA 136 08/27/2014   K 4.2 08/27/2014   CL 100 08/27/2014   CO2 28 08/27/2014   Lab Results  Component Value Date   MICROALBUR 0.3 05/03/2014     Hyperlipidemia Well controlled on current statin therapy.   Liver  enzymes are normal , no changes today.  Lab Results  Component Value Date   CHOL 151 05/03/2014   HDL 45.60 05/03/2014   LDLCALC 91 05/03/2014   LDLDIRECT 123.0 01/31/2014   TRIG 72.0 05/03/2014   CHOLHDL 3 05/03/2014   Lab Results  Component Value Date   ALT 21 08/27/2014   AST 21 08/27/2014   ALKPHOS 119* 08/27/2014   BILITOT 0.6 08/27/2014     Diabetes mellitus type II, uncontrolled  Improved control  on current medications.  hemoglobin A1c is finally under 8.0  Increasing metformin to 850 mg bid and will adjust insulin NPH dose when she drops off sugars.  Patient is tolerating statin therapy for CAD risk reduction and on ACE/ARB for reduction in proteinuria..  Reminder for annual eye exam given.     Lab Results  Component Value Date   HGBA1C 7.9* 08/27/2014   Lab Results  Component Value Date   MICROALBUR 0.3 05/03/2014    A total of 25 minutes of face to face time was spent with patient more than half of which was spent in counselling on diabetes management  and coordination of care    Updated Medication List Outpatient Encounter Prescriptions as of 08/29/2014  Medication Sig  . amLODipine (NORVASC) 10 MG tablet TAKE 1 TABLET (10 MG TOTAL) BY MOUTH DAILY.  . cloNIDine (CATAPRES) 0.2 MG tablet TAKE 1 TABLET BY MOUTH TWICE A DAY  . glipiZIDE (GLUCOTROL) 10 MG tablet TAKE 1 TABLET BY MOUTH TWICE A DAY BEFORE MEALS  . ibuprofen (ADVIL,MOTRIN) 200 MG tablet Take 200 mg by mouth as needed.  . INS SYRINGE/NEEDLE 1CC/28G (B-D INSULIN SYRINGE 1CC/28G) 28G X 1/2" 1 ML MISC 1 Syringe by Does not apply route 2 (two) times daily.  . insulin NPH Human (NOVOLIN N) 100 UNIT/ML injection 20 units at bedtime,  12 units at breakfast  . losartan (COZAAR) 100 MG tablet TAKE 1 TABLET BY MOUTH EVERY DAY  . losartan (COZAAR) 100 MG tablet TAKE 1 TABLET BY MOUTH EVERY DAY  . metFORMIN (GLUCOPHAGE) 850 MG tablet Take 1 tablet (850 mg total) by mouth 2 (two) times daily with a meal.  . ONE TOUCH ULTRA TEST test  strip TEST SUGAR TWICE A DAY  . polyethylene glycol powder (GLYCOLAX/MIRALAX) powder 255 grams one bottle for colonoscopy prep  . sertraline (ZOLOFT) 50 MG tablet TAKE 1 TABLET BY MOUTH EVERY DAY  . simvastatin (ZOCOR) 40 MG tablet TAKE 1 TABLET (40 MG TOTAL) BY MOUTH EVERY EVENING.  Marland Kitchen spironolactone (ALDACTONE) 50 MG tablet TAKE 1 TABLET (50 MG TOTAL) BY MOUTH DAILY.  . [DISCONTINUED] hydrochlorothiazide (HYDRODIURIL) 25 MG tablet TAKE 1 TABLET BY MOUTH EVERY DAY  . [DISCONTINUED] insulin NPH Human (NOVOLIN N) 100 UNIT/ML injection 16 units at bedtime,  8 units at breakfast  . [DISCONTINUED] metFORMIN (GLUCOPHAGE) 500 MG tablet Take 1 tablet (500 mg total) by mouth 2 (two) times daily with a  meal.  . hydrochlorothiazide (HYDRODIURIL) 25 MG tablet Take 1 tablet (25 mg total) by mouth daily.  Marland Kitchen losartan (COZAAR) 100 MG tablet Take 1 tablet (100 mg total) by mouth daily.  . Tdap (BOOSTRIX) 5-2.5-18.5 LF-MCG/0.5 injection Inject 0.5 mLs into the muscle once.     No orders of the defined types were placed in this encounter.    Return in about 3 months (around 11/28/2014) for follow up diabetes.

## 2014-08-29 NOTE — Assessment & Plan Note (Addendum)
Her bp is improving but still not at goal  On 4 medications. Patient is adamant about compliance but i am not seeing a refill on clonidine and spironolactone. ,  ADDING HCTZ 25 MG TODAY  Lab Results  Component Value Date   CREATININE 1.0 08/27/2014   Lab Results  Component Value Date   NA 136 08/27/2014   K 4.2 08/27/2014   CL 100 08/27/2014   CO2 28 08/27/2014   Lab Results  Component Value Date   MICROALBUR 0.3 05/03/2014

## 2014-08-29 NOTE — Assessment & Plan Note (Signed)
Well controlled on current statin therapy.   Liver enzymes are normal , no changes today.  Lab Results  Component Value Date   CHOL 151 05/03/2014   HDL 45.60 05/03/2014   LDLCALC 91 05/03/2014   LDLDIRECT 123.0 01/31/2014   TRIG 72.0 05/03/2014   CHOLHDL 3 05/03/2014   Lab Results  Component Value Date   ALT 21 08/27/2014   AST 21 08/27/2014   ALKPHOS 119* 08/27/2014   BILITOT 0.6 08/27/2014

## 2014-08-29 NOTE — Assessment & Plan Note (Addendum)
Improved control  on current medications.  hemoglobin A1c is finally under 8.0  Increasing metformin to 850 mg bid and will adjust insulin NPH dose when she drops off sugars.  Patient is tolerating statin therapy for CAD risk reduction and on ACE/ARB for reduction in proteinuria..  Reminder for annual eye exam given.     Lab Results  Component Value Date   HGBA1C 7.9* 08/27/2014   Lab Results  Component Value Date   MICROALBUR 0.3 05/03/2014

## 2014-08-29 NOTE — Patient Instructions (Addendum)
Your diabetes control is improving!  We still have some work to do, though  Please drop off your log of blood sugars so I can adjust your insulin SAFELYI  I  Am increasing your dose of metformin to 850 mg twice daily at your next refill . New rx provided  i am adding hctz 25 mg daily in the morning for your BLOOD PRESSURE   I will see you again in 3 months  PLEASE GET YOUR EYES EXAMINED ASAP

## 2014-09-18 ENCOUNTER — Other Ambulatory Visit: Payer: Self-pay | Admitting: Internal Medicine

## 2014-09-28 ENCOUNTER — Ambulatory Visit: Payer: Self-pay | Admitting: Internal Medicine

## 2014-10-01 ENCOUNTER — Encounter: Payer: Self-pay | Admitting: Internal Medicine

## 2014-10-02 ENCOUNTER — Encounter: Payer: Self-pay | Admitting: Internal Medicine

## 2014-10-02 DIAGNOSIS — R928 Other abnormal and inconclusive findings on diagnostic imaging of breast: Secondary | ICD-10-CM | POA: Insufficient documentation

## 2014-10-05 ENCOUNTER — Encounter: Payer: Self-pay | Admitting: Internal Medicine

## 2014-10-08 ENCOUNTER — Ambulatory Visit: Payer: Self-pay | Admitting: Internal Medicine

## 2014-10-15 ENCOUNTER — Other Ambulatory Visit: Payer: Self-pay | Admitting: Internal Medicine

## 2014-10-18 ENCOUNTER — Other Ambulatory Visit: Payer: Self-pay | Admitting: Internal Medicine

## 2014-10-30 ENCOUNTER — Encounter: Payer: Self-pay | Admitting: Internal Medicine

## 2014-11-06 ENCOUNTER — Encounter: Payer: Self-pay | Admitting: Internal Medicine

## 2014-11-17 ENCOUNTER — Other Ambulatory Visit: Payer: Self-pay | Admitting: Internal Medicine

## 2014-11-23 IMAGING — US US BREAST*L* LIMITED INC AXILLA
1 series · 6 of 6 positions shown · non-contrast
Comparison: 09/28/2014.

CLINICAL DATA: Call back from screening for a possible left breast
mass.

EXAM:
DIGITAL DIAGNOSTIC  LEFT MAMMOGRAM WITH CAD
ULTRASOUND LEFT BREAST

[Series 1: us breast*left* limited inc axilla · 0.08mm/px · 6 of 6 slices shown]
[im 1/6]
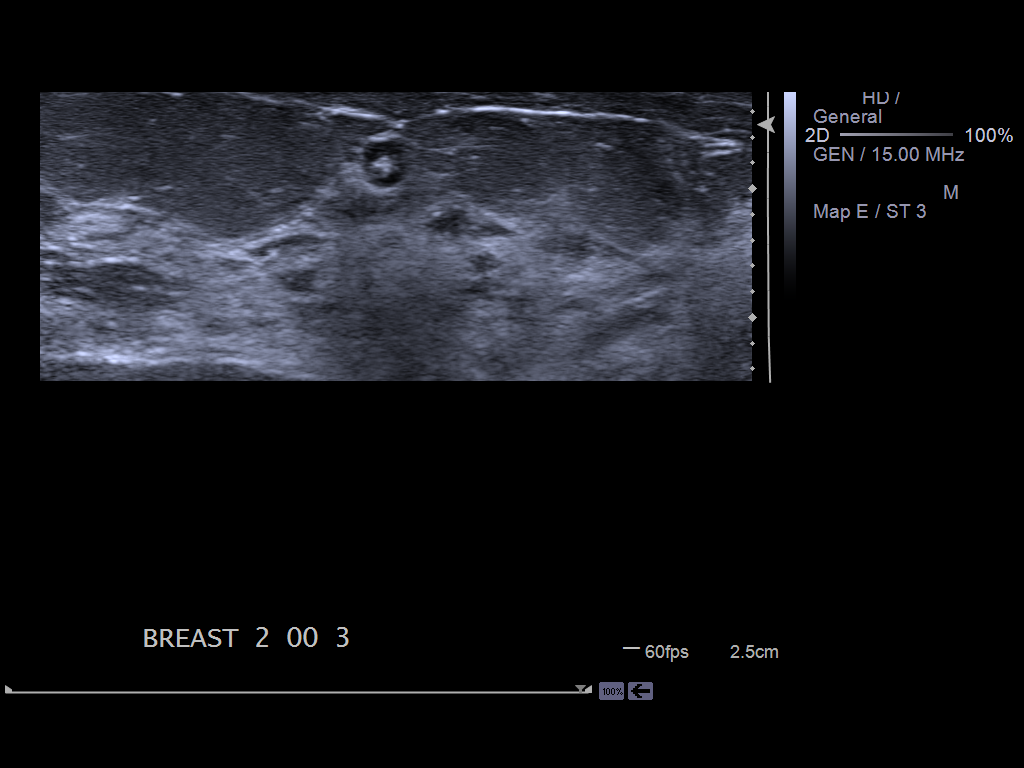
[im 2/6]
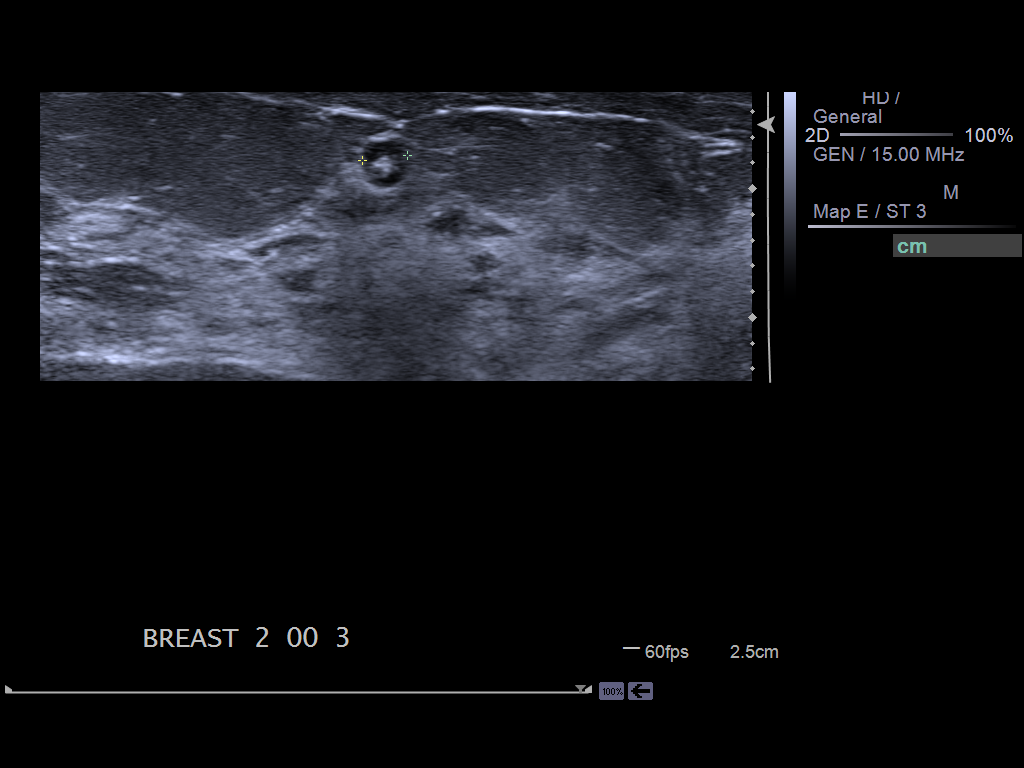
[im 3/6]
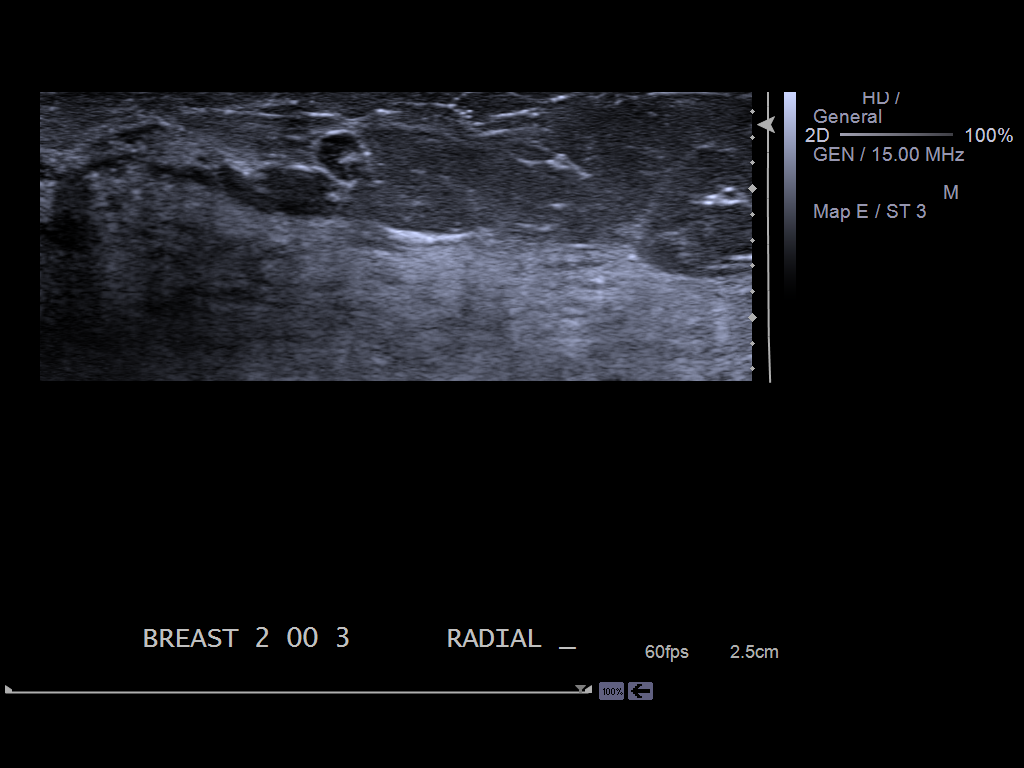
[im 4/6]
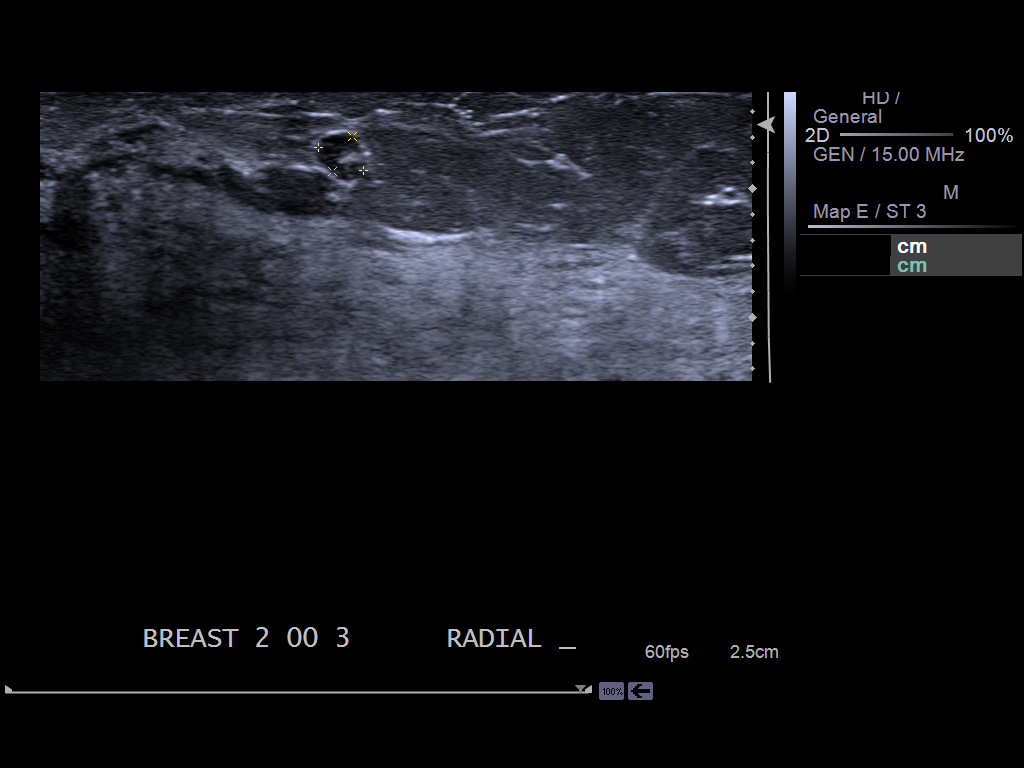
[im 5/6]
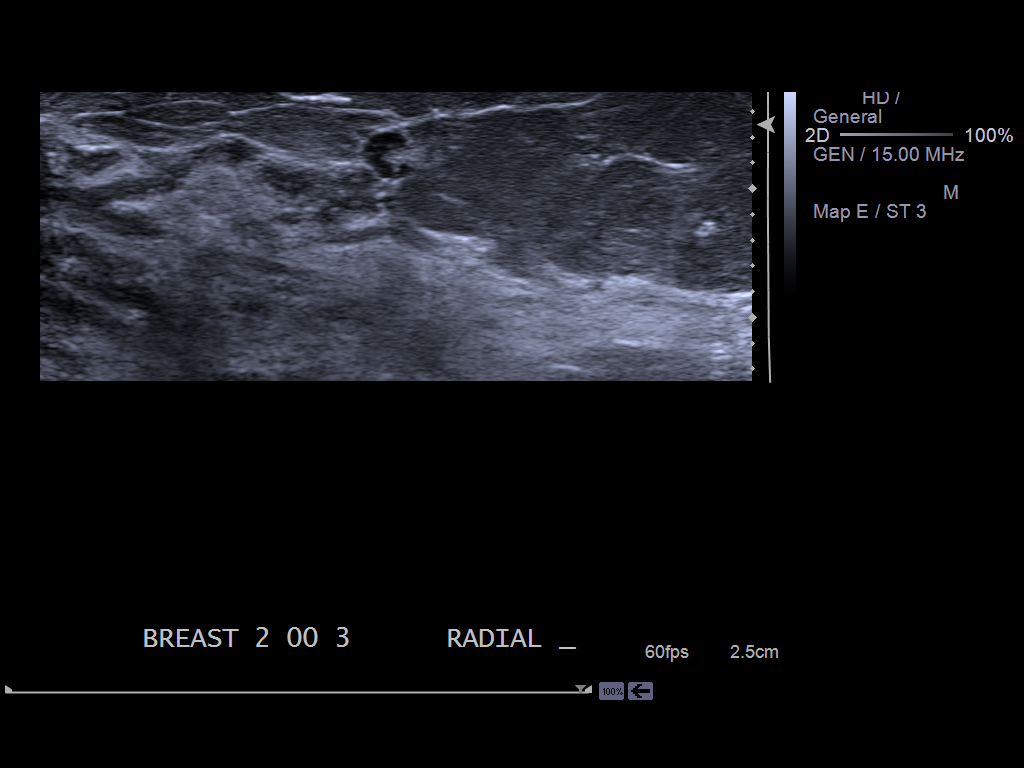
[im 6/6]
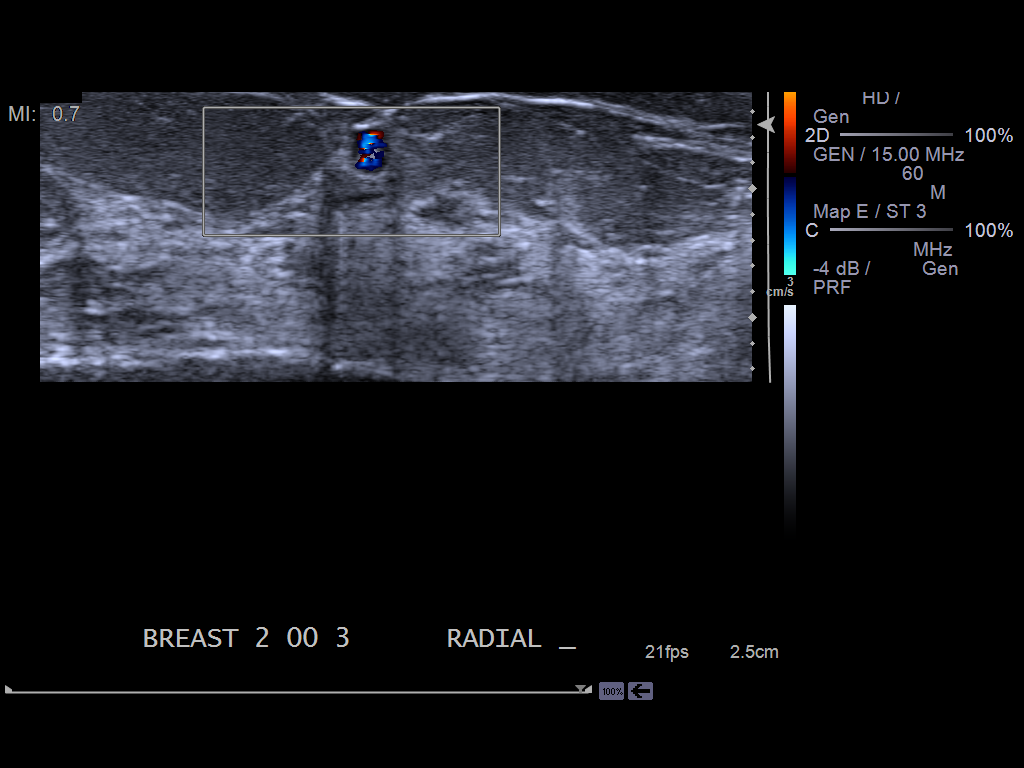

[6 of 6 positions shown; findings below may reference images not displayed]

No older exams.

ACR Breast Density Category c: The breast tissue is heterogeneously
dense, which may obscure small masses.
FINDINGS: Small mass persists on spot compression MLO imaging and is suggested
in the anterior upper outer quadrant based on the mL view. Mass is
relatively lucent and is circumscribed.

Mammographic images were processed with CAD.

Ultrasound is performed, showing an oval hypoechoic vascular mass in
the 2 o'clock position, 3 cm the nipple, measuring 3.5 mm x 3.1 mm x
3.9 mm. This is almost certainly an intramammary lymph node. This is
consistent with the mammographic abnormality.
IMPRESSION: Probably benign left breast intramammary lymph node.

RECOMMENDATION:
Given this patient's age and lack of older exams, short-term
follow-up of this presumed intramammary lymph node is recommended.
Recommend left breast diagnostic breast mammography and ultrasound
in 6 months.

I have discussed the findings and recommendations with the patient.
Results were also provided in writing at the conclusion of the
visit. If applicable, a reminder letter will be sent to the patient
regarding the next appointment.

BI-RADS CATEGORY  3: Probably benign.

## 2014-11-28 ENCOUNTER — Ambulatory Visit: Payer: PRIVATE HEALTH INSURANCE | Admitting: Internal Medicine

## 2014-12-07 ENCOUNTER — Ambulatory Visit: Payer: PRIVATE HEALTH INSURANCE | Admitting: Internal Medicine

## 2014-12-13 ENCOUNTER — Telehealth: Payer: Self-pay | Admitting: Internal Medicine

## 2014-12-13 DIAGNOSIS — IMO0002 Reserved for concepts with insufficient information to code with codable children: Secondary | ICD-10-CM

## 2014-12-13 DIAGNOSIS — E785 Hyperlipidemia, unspecified: Secondary | ICD-10-CM

## 2014-12-13 DIAGNOSIS — E1165 Type 2 diabetes mellitus with hyperglycemia: Secondary | ICD-10-CM

## 2014-12-13 NOTE — Telephone Encounter (Signed)
Pt notified and  verbalized understanding.  Fasting lab appt scheduled 12/21/14.

## 2014-12-13 NOTE — Telephone Encounter (Signed)
Blood sugars received.  Even though she missed her appt,  She is due for fasting labs ,  Please have them done so I can advise her on any changes in therapy

## 2014-12-21 ENCOUNTER — Other Ambulatory Visit: Payer: PRIVATE HEALTH INSURANCE

## 2014-12-24 ENCOUNTER — Encounter: Payer: Self-pay | Admitting: Internal Medicine

## 2014-12-24 ENCOUNTER — Ambulatory Visit (INDEPENDENT_AMBULATORY_CARE_PROVIDER_SITE_OTHER): Payer: PRIVATE HEALTH INSURANCE | Admitting: Internal Medicine

## 2014-12-24 ENCOUNTER — Other Ambulatory Visit (INDEPENDENT_AMBULATORY_CARE_PROVIDER_SITE_OTHER): Payer: PRIVATE HEALTH INSURANCE

## 2014-12-24 ENCOUNTER — Ambulatory Visit: Payer: PRIVATE HEALTH INSURANCE | Admitting: Internal Medicine

## 2014-12-24 VITALS — BP 168/98 | HR 73 | Temp 97.7°F | Resp 14 | Ht 63.0 in | Wt 139.8 lb

## 2014-12-24 DIAGNOSIS — E785 Hyperlipidemia, unspecified: Secondary | ICD-10-CM

## 2014-12-24 DIAGNOSIS — IMO0001 Reserved for inherently not codable concepts without codable children: Secondary | ICD-10-CM

## 2014-12-24 DIAGNOSIS — IMO0002 Reserved for concepts with insufficient information to code with codable children: Secondary | ICD-10-CM

## 2014-12-24 DIAGNOSIS — R928 Other abnormal and inconclusive findings on diagnostic imaging of breast: Secondary | ICD-10-CM

## 2014-12-24 DIAGNOSIS — E1165 Type 2 diabetes mellitus with hyperglycemia: Secondary | ICD-10-CM

## 2014-12-24 DIAGNOSIS — Z13820 Encounter for screening for osteoporosis: Secondary | ICD-10-CM

## 2014-12-24 DIAGNOSIS — N179 Acute kidney failure, unspecified: Secondary | ICD-10-CM

## 2014-12-24 DIAGNOSIS — R030 Elevated blood-pressure reading, without diagnosis of hypertension: Secondary | ICD-10-CM

## 2014-12-24 LAB — COMPREHENSIVE METABOLIC PANEL
ALT: 24 U/L (ref 0–35)
AST: 20 U/L (ref 0–37)
Albumin: 4.3 g/dL (ref 3.5–5.2)
Alkaline Phosphatase: 150 U/L — ABNORMAL HIGH (ref 39–117)
BUN: 51 mg/dL — AB (ref 6–23)
CHLORIDE: 103 meq/L (ref 96–112)
CO2: 25 meq/L (ref 19–32)
CREATININE: 1.64 mg/dL — AB (ref 0.40–1.20)
Calcium: 9.5 mg/dL (ref 8.4–10.5)
GFR: 40.37 mL/min — ABNORMAL LOW (ref >60.00)
GLUCOSE: 74 mg/dL (ref 70–99)
Potassium: 4.3 mEq/L (ref 3.5–5.1)
Sodium: 138 mEq/L (ref 135–145)
Total Bilirubin: 0.5 mg/dL (ref 0.2–1.2)
Total Protein: 8.3 g/dL (ref 6.0–8.3)

## 2014-12-24 LAB — LIPID PANEL
CHOLESTEROL: 169 mg/dL (ref 0–200)
HDL: 43.4 mg/dL (ref >39.00)
LDL CALC: 106 mg/dL — AB (ref 0–99)
NonHDL: 125.6
Total CHOL/HDL Ratio: 4
Triglycerides: 97 mg/dL (ref 0.0–149.0)
VLDL: 19.4 mg/dL (ref 0.0–40.0)

## 2014-12-24 LAB — MICROALBUMIN / CREATININE URINE RATIO
Creatinine,U: 102 mg/dL
MICROALB/CREAT RATIO: 2.9 mg/g (ref 0.0–30.0)
Microalb, Ur: 3 mg/dL — ABNORMAL HIGH (ref 0.0–1.9)

## 2014-12-24 LAB — HEMOGLOBIN A1C: HEMOGLOBIN A1C: 7.9 % — AB (ref 4.6–6.5)

## 2014-12-24 MED ORDER — LISINOPRIL 20 MG PO TABS: 20.0000 mg | ORAL_TABLET | Freq: Every day | ORAL | Status: DC

## 2014-12-24 NOTE — Progress Notes (Signed)
Pre-visit discussion using our clinic review tool. No additional management support is needed unless otherwise documented below in the visit note.  

## 2014-12-24 NOTE — Assessment & Plan Note (Addendum)
Patient's home bps are also elevated by report.  Adding lisinopril 20  Mg today,  Patient will return in 2 weeks for BMET and BP   Lab Results  Component Value Date   CREATININE 1.64* 12/24/2014   Lab Results  Component Value Date   NA 138 12/24/2014   K 4.3 12/24/2014   CL 103 12/24/2014   CO2 25 12/24/2014

## 2014-12-24 NOTE — Progress Notes (Signed)
Patient ID: Julie Dominguez, female   DOB: 1951/01/29, 64 y.o.   MRN: [id number]  Patient Active Problem List   Diagnosis Date Noted  . Acute renal failure 12/25/2014  . Abnormal mammogram of left breast 10/02/2014  . Special screening for malignant neoplasms, colon 02/21/2014  . Screening for breast cancer 03/02/2013  . Screening for colon cancer 03/02/2013  . Screening for cervical cancer 03/02/2013  . GAD (generalized anxiety disorder) 05/08/2012  . Cataract associated with another disorder 05/08/2012  . Diabetes mellitus type II, uncontrolled 05/01/2012  . White coat hypertension   . Hyperlipidemia     Subjective:  CC:   Chief Complaint  Patient presents with  . Follow-up  . Diabetes    HPI:   Julie Dominguez is a 64 y.o. female who presents for  Follow up on DM type 2, hypertension  And hyperlipidemia.  Patient is fasting today and has not taken any of her blood pressure medications today .  She states that her home blood pressure checks have been elevated as well despite regular use of amlodipine, losartan spironolactone,  hctz and clonidine.     She submitted a log of her blood sugars last week and there were several that were approaching or at 200 , but at various times,  No real pattern. She has had no low blood sugars.   Lab Results  Component Value Date   HGBA1C 7.9* 08/27/2014   Lab Results  Component Value Date   MICROALBUR 0.3 05/03/2014   Lab Results  Component Value Date   CHOL 151 05/03/2014   HDL 45.60 05/03/2014   LDLCALC 91 05/03/2014   LDLDIRECT 123.0 01/31/2014   TRIG 72.0 05/03/2014   CHOLHDL 3 05/03/2014   She is long overdue for her annual eye exam with Dr Thomasene Ripple, despite recurrent reminders.  She is concerned that it will not be covered by her insurance. Annual diabetic eye exams are covered by all major insurance exams.  She is not exercisign regularly but has received information about [employer] sne[employer] programs and has been  encouraged to participate.  She does not some morning stiffness involving  the hips and lower back      Past Medical History  Diagnosis Date  . Diabetes mellitus 2007  . Hypertension 1988    uncontrolled   . Hyperlipidemia     Past Surgical History  Procedure Laterality Date  . Eye surgery Bilateral August 2013, Oct 2013    catraract with lens implant dingledein        The following portions of the patient's history were reviewed and updated as appropriate: Allergies, current medications, and problem list.    Review of Systems:   Patient denies headache, fevers, malaise, unintentional weight loss, skin rash, eye pain, sinus congestion and sinus pain, sore throat, dysphagia,  hemoptysis , cough, dyspnea, wheezing, chest pain, palpitations, orthopnea, edema, abdominal pain, nausea, melena, diarrhea, constipation, flank pain, dysuria, hematuria, urinary  Frequency, nocturia, numbness, tingling, seizures,  Focal weakness, Loss of consciousness,  Tremor, insomnia, depression, anxiety, and suicidal ideation.     History   Social History  . Marital Status: Single    Spouse Name: N/A    Number of Children: N/A  . Years of Education: N/A   Occupational History  . Not on file.   Social History Main Topics  . Smoking status: Never Smoker   . Smokeless tobacco: Never Used  . Alcohol Use: No  . Drug Use: No  . Sexual Activity:  Not Currently   Other Topics Concern  . Not on file   Social History Narrative    Objective:  Filed Vitals:   12/24/14 0927  BP: 168/98  Pulse: 73  Temp: 97.7 F (36.5 C)  Resp: 14     General appearance: alert, cooperative and appears stated age Ears: normal TM's and external ear canals both ears Throat: lips, mucosa, and tongue normal; teeth and gums normal Neck: no adenopathy, no carotid bruit, supple, symmetrical, trachea midline and thyroid not enlarged, symmetric, no tenderness/mass/nodules Back: symmetric, no curvature. ROM normal.  No CVA tenderness. Lungs: clear to auscultation bilaterally Heart: regular rate and rhythm, S1, S2 normal, no murmur, click, rub or gallop Abdomen: soft, non-tender; bowel sounds normal; no masses,  no organomegaly Pulses: 2+ and symmetric Skin: Skin color, texture, turgor normal. No rashes or lesions Lymph nodes: Cervical, supraclavicular, and axillary nodes normal.  Assessment and Plan:  White coat hypertension Patient's home bps are also elevated by report.  Adding lisinopril 20  Mg today,  Patient will return in 2 weeks for BMET and BP   Lab Results  Component Value Date   CREATININE 1.64* 12/24/2014   Lab Results  Component Value Date   NA 138 12/24/2014   K 4.3 12/24/2014   CL 103 12/24/2014   CO2 25 12/24/2014      Diabetes mellitus type II, uncontrolled  Improved control  on current medications.  hemoglobin A1c is finally under 8.0  At last visit her  metformin was increased  .  Will increase her NPH dose  evening and morning to 24 units and 15 units respectively.   Patient is tolerating statin therapy for CAD risk reduction and on ACE/ARB for reduction in proteinuria..  Reminder for annual eye exam given.     Lab Results  Component Value Date   HGBA1C 7.9* 12/24/2014   Lab Results  Component Value Date   MICROALBUR 3.0* 12/24/2014        Abnormal mammogram of left breast 6 month follow up mammogram is due in May    Hyperlipidemia Well controlled on current statin therapy.   Liver enzymes are normal , no changes today.  Lab Results  Component Value Date   CHOL 169 12/24/2014   HDL 43.40 12/24/2014   LDLCALC 106* 12/24/2014   LDLDIRECT 123.0 01/31/2014   TRIG 97.0 12/24/2014   CHOLHDL 4 12/24/2014   Lab Results  Component Value Date   ALT 24 12/24/2014   AST 20 12/24/2014   ALKPHOS 150* 12/24/2014   BILITOT 0.5 12/24/2014        Acute renal failure May be secondary to fasting for labs,  But given her concurrent use of hctz and aldactone  for uncontrolled hypertension,  Will stop the aldactone and repeat BMET in 2 weeks      Updated Medication List Outpatient Encounter Prescriptions as of 12/24/2014  Medication Sig  . amLODipine (NORVASC) 10 MG tablet TAKE 1 TABLET (10 MG TOTAL) BY MOUTH DAILY.  Marland Kitchen amLODipine (NORVASC) 10 MG tablet TAKE 1 TABLET (10 MG TOTAL) BY MOUTH DAILY.  . cloNIDine (CATAPRES) 0.2 MG tablet TAKE 1 TABLET BY MOUTH TWICE A DAY  . glipiZIDE (GLUCOTROL) 10 MG tablet TAKE 1 TABLET BY MOUTH TWICE A DAY BEFORE MEALS  . hydrochlorothiazide (HYDRODIURIL) 25 MG tablet Take 1 tablet (25 mg total) by mouth daily.  . hydrochlorothiazide (HYDRODIURIL) 25 MG tablet TAKE 1 TABLET (25 MG TOTAL) BY MOUTH DAILY.  Marland Kitchen ibuprofen (ADVIL,MOTRIN) 200 MG  tablet Take 200 mg by mouth as needed.  . INS SYRINGE/NEEDLE 1CC/28G (B-D INSULIN SYRINGE 1CC/28G) 28G X 1/2" 1 ML MISC 1 Syringe by Does not apply route 2 (two) times daily.  . insulin NPH Human (NOVOLIN N) 100 UNIT/ML injection 24 units at bedtime,  15 units at breakfast  . losartan (COZAAR) 100 MG tablet TAKE 1 TABLET BY MOUTH EVERY DAY  . losartan (COZAAR) 100 MG tablet TAKE 1 TABLET BY MOUTH EVERY DAY  . metFORMIN (GLUCOPHAGE) 850 MG tablet Take 1 tablet (850 mg total) by mouth 2 (two) times daily with a meal.  . ONE TOUCH ULTRA TEST test strip TEST SUGAR TWICE A DAY  . polyethylene glycol powder (GLYCOLAX/MIRALAX) powder 255 grams one bottle for colonoscopy prep  . sertraline (ZOLOFT) 50 MG tablet TAKE 1 TABLET BY MOUTH EVERY DAY  . simvastatin (ZOCOR) 40 MG tablet TAKE 1 TABLET BY MOUTH IN THE EVENING  . spironolactone (ALDACTONE) 50 MG tablet TAKE 1 TABLET (50 MG TOTAL) BY MOUTH DAILY.  . [DISCONTINUED] insulin NPH Human (NOVOLIN N) 100 UNIT/ML injection 20 units at bedtime,  12 units at breakfast  . lisinopril (PRINIVIL,ZESTRIL) 20 MG tablet Take 1 tablet (20 mg total) by mouth daily.  Marland Kitchen losartan (COZAAR) 100 MG tablet Take 1 tablet (100 mg total) by mouth daily.  . Tdap  (BOOSTRIX) 5-2.5-18.5 LF-MCG/0.5 injection Inject 0.5 mLs into the muscle once. (Patient not taking: Reported on 12/24/2014)     Orders Placed This Encounter  Procedures  . Basic Metabolic Panel (BMET)    Return in about 2 weeks (around 01/07/2015).

## 2014-12-24 NOTE — Patient Instructions (Addendum)
Your blood pressure is elevated so I am  Going to add lisinopril 20 mg daily  To your other medications  Continue amlodipine, losartan,  Hctz,  Clonidine and spironolactone.    Return in 2 weeks for a blood pressure check and a check on your potassium. Check .  Please take your medications that morning and bring a log of your blood sugars too,  You are really overdue for your eye exam !!

## 2014-12-25 DIAGNOSIS — N179 Acute kidney failure, unspecified: Secondary | ICD-10-CM | POA: Insufficient documentation

## 2014-12-25 MED ORDER — INSULIN NPH (HUMAN) (ISOPHANE) 100 UNIT/ML ~~LOC~~ SUSP: SUBCUTANEOUS | Status: DC

## 2014-12-25 NOTE — Assessment & Plan Note (Signed)
Improved control  on current medications.  hemoglobin A1c is finally under 8.0  At last visit her  metformin was increased  .  Will increase her NPH dose  evening and morning to 24 units and 15 units respectively.   Patient is tolerating statin therapy for CAD risk reduction and on ACE/ARB for reduction in proteinuria..  Reminder for annual eye exam given.     Lab Results  Component Value Date   HGBA1C 7.9* 12/24/2014   Lab Results  Component Value Date   MICROALBUR 3.0* 12/24/2014

## 2014-12-25 NOTE — Assessment & Plan Note (Signed)
6 month follow up mammogram is due in May

## 2014-12-25 NOTE — Assessment & Plan Note (Signed)
May be secondary to fasting for labs,  But given her concurrent use of hctz and aldactone for uncontrolled hypertension,  Will stop the aldactone and repeat BMET in 2 weeks

## 2014-12-25 NOTE — Assessment & Plan Note (Signed)
Well controlled on current statin therapy.   Liver enzymes are normal , no changes today.  Lab Results  Component Value Date   CHOL 169 12/24/2014   HDL 43.40 12/24/2014   LDLCALC 106* 12/24/2014   LDLDIRECT 123.0 01/31/2014   TRIG 97.0 12/24/2014   CHOLHDL 4 12/24/2014   Lab Results  Component Value Date   ALT 24 12/24/2014   AST 20 12/24/2014   ALKPHOS 150* 12/24/2014   BILITOT 0.5 12/24/2014

## 2015-01-09 ENCOUNTER — Ambulatory Visit (INDEPENDENT_AMBULATORY_CARE_PROVIDER_SITE_OTHER): Payer: Medicare Other | Admitting: Internal Medicine

## 2015-01-09 ENCOUNTER — Encounter: Payer: Self-pay | Admitting: Internal Medicine

## 2015-01-09 VITALS — BP 190/86 | HR 102 | Temp 97.9°F | Resp 16 | Ht 63.0 in | Wt 142.5 lb

## 2015-01-09 DIAGNOSIS — IMO0001 Reserved for inherently not codable concepts without codable children: Secondary | ICD-10-CM

## 2015-01-09 DIAGNOSIS — N179 Acute kidney failure, unspecified: Secondary | ICD-10-CM

## 2015-01-09 DIAGNOSIS — IMO0002 Reserved for concepts with insufficient information to code with codable children: Secondary | ICD-10-CM

## 2015-01-09 DIAGNOSIS — R030 Elevated blood-pressure reading, without diagnosis of hypertension: Secondary | ICD-10-CM

## 2015-01-09 DIAGNOSIS — E1165 Type 2 diabetes mellitus with hyperglycemia: Secondary | ICD-10-CM

## 2015-01-09 LAB — BASIC METABOLIC PANEL
BUN: 35 mg/dL — ABNORMAL HIGH (ref 6–23)
CO2: 23 mEq/L (ref 19–32)
Calcium: 9.7 mg/dL (ref 8.4–10.5)
Chloride: 101 mEq/L (ref 96–112)
Creatinine, Ser: 0.88 mg/dL (ref 0.40–1.20)
GFR: 82.8 mL/min (ref >60.00)
Glucose, Bld: 140 mg/dL — ABNORMAL HIGH (ref 70–99)
Potassium: 4.1 mEq/L (ref 3.5–5.1)
SODIUM: 136 meq/L (ref 135–145)

## 2015-01-09 NOTE — Progress Notes (Signed)
Pre-visit discussion using our clinic review tool. No additional management support is needed unless otherwise documented below in the visit note.  

## 2015-01-09 NOTE — Progress Notes (Addendum)
Patient ID: Julie Dominguez, female   DOB: [birth year], 64 y.o.   MRN: [id number]   Patient Active Problem List   Diagnosis Date Noted  . Acute renal failure 12/25/2014  . Abnormal mammogram of left breast 10/02/2014  . Special screening for malignant neoplasms, colon 02/21/2014  . Screening for breast cancer 03/02/2013  . Screening for colon cancer 03/02/2013  . Screening for cervical cancer 03/02/2013  . GAD (generalized anxiety disorder) 05/08/2012  . Cataract associated with another disorder 05/08/2012  . Diabetes mellitus type II, uncontrolled 05/01/2012  . White coat hypertension   . Hyperlipidemia     Subjective:  CC:   Chief Complaint  Patient presents with  . Follow-up    Patient forgot to bring blood sugar log.Patient also staed that pharmacy has no record of patient taking spionolactone.    HPI:   Julie Dominguez is a 64 y.o. female who presents for  Follow up on uncontrolled hypertension and diabetes mellitus, who forgot her sugar log, but from memory states that her home readings have been as high as 150-160  Most times,  Not usually in range.Her blood sugars have been 90 to 116. Fasting   Her medications were changed two weeks ago, due to elevated cr.  Spironolactone was stopped. She appears to be confused about her medications and reports that her pharmacy told her she had spironolactone filled, although on prior visits she insist he has been taking it. Blood pressure at home have been elevated to 150-160.   Past Medical History  Diagnosis Date  . Diabetes mellitus 2007  . Hypertension 1988    uncontrolled   . Hyperlipidemia     Past Surgical History  Procedure Laterality Date  . Eye surgery Bilateral August 2013, Oct 2013    catraract with lens implant dingledein        The following portions of the patient's history were reviewed and updated as appropriate: Allergies, current medications, and problem list.    Review of Systems:   Patient denies headache, fevers, malaise, unintentional weight loss, skin rash, eye pain, sinus congestion and sinus pain, sore throat, dysphagia,  hemoptysis , cough, dyspnea, wheezing, chest pain, palpitations, orthopnea, edema, abdominal pain, nausea, melena, diarrhea, constipation, flank pain, dysuria, hematuria, urinary  Frequency, nocturia, numbness, tingling, seizures,  Focal weakness, Loss of consciousness,  Tremor, insomnia, depression, anxiety, and suicidal ideation.     History   Social History  . Marital Status: Single    Spouse Name: N/A  . Number of Children: N/A  . Years of Education: N/A   Occupational History  . Not on file.   Social History Main Topics  . Smoking status: Never Smoker   . Smokeless tobacco: Never Used  . Alcohol Use: No  . Drug Use: No  . Sexual Activity: Not Currently   Other Topics Concern  . Not on file   Social History Narrative    Objective:  Filed Vitals:   01/09/15 1347  BP: 190/86  Pulse: 102  Temp: 97.9 F (36.6 C)  Resp: 16     General appearance: alert, cooperative and appears stated age Ears: normal TM's and external ear canals both ears Throat: lips, mucosa, and tongue normal; teeth and gums normal Neck: no adenopathy, no carotid bruit, supple, symmetrical, trachea midline and thyroid not enlarged, symmetric, no tenderness/mass/nodules Back: symmetric, no curvature. ROM normal. No CVA tenderness. Lungs: clear to auscultation bilaterally Heart: regular rate and rhythm, S1, S2 normal, no murmur, click, rub or  gallop Abdomen: soft, non-tender; bowel sounds normal; no masses,  no organomegaly Pulses: 2+ and symmetric Skin: Skin color, texture, turgor normal. No rashes or lesions Lymph nodes: Cervical, supraclavicular, and axillary nodes normal.  Assessment and Plan:  White coat hypertension Home BPs are elevated as well. Renal function and potassium are normal.  Will Increase lisinbopril to 40 mg daily and repeat  BP and  BMET in one week.   Lab Results  Component Value Date   CREATININE 0.88 01/09/2015  . Lab Results  Component Value Date   NA 136 01/09/2015   K 4.1 01/09/2015   CL 101 01/09/2015   CO2 23 01/09/2015      Acute renal failure Resolved with suspension of spironolactone   Diabetes mellitus type II, uncontrolled Lab Results  Component Value Date   HGBA1C 7.9* 12/24/2014   She was asked to bring BS log of the past two weeks for dose adjustments;  however she failed to do so, No changes were made today since she did not bring her blood sugar log.       Updated Medication List Outpatient Encounter Prescriptions as of 01/09/2015  Medication Sig  . amLODipine (NORVASC) 10 MG tablet TAKE 1 TABLET (10 MG TOTAL) BY MOUTH DAILY.  . cloNIDine (CATAPRES) 0.2 MG tablet TAKE 1 TABLET BY MOUTH TWICE A DAY  . glipiZIDE (GLUCOTROL) 10 MG tablet TAKE 1 TABLET BY MOUTH TWICE A DAY BEFORE MEALS  . hydrochlorothiazide (HYDRODIURIL) 25 MG tablet Take 1 tablet (25 mg total) by mouth daily.  . hydrochlorothiazide (HYDRODIURIL) 25 MG tablet TAKE 1 TABLET (25 MG TOTAL) BY MOUTH DAILY.  . INS SYRINGE/NEEDLE 1CC/28G (B-D INSULIN SYRINGE 1CC/28G) 28G X 1/2" 1 ML MISC 1 Syringe by Does not apply route 2 (two) times daily.  . insulin NPH Human (NOVOLIN N) 100 UNIT/ML injection 24 units at bedtime,  15 units at breakfast  . lisinopril (PRINIVIL,ZESTRIL) 20 MG tablet Take 1 tablet (20 mg total) by mouth daily.  Marland Kitchen losartan (COZAAR) 100 MG tablet TAKE 1 TABLET BY MOUTH EVERY DAY  . metFORMIN (GLUCOPHAGE) 850 MG tablet Take 1 tablet (850 mg total) by mouth 2 (two) times daily with a meal.  . ONE TOUCH ULTRA TEST test strip TEST SUGAR TWICE A DAY  . simvastatin (ZOCOR) 40 MG tablet TAKE 1 TABLET BY MOUTH IN THE EVENING  . Tdap (BOOSTRIX) 5-2.5-18.5 LF-MCG/0.5 injection Inject 0.5 mLs into the muscle once.  . [DISCONTINUED] amLODipine (NORVASC) 10 MG tablet TAKE 1 TABLET (10 MG TOTAL) BY MOUTH DAILY.  .  [DISCONTINUED] ibuprofen (ADVIL,MOTRIN) 200 MG tablet Take 200 mg by mouth as needed.  . [DISCONTINUED] losartan (COZAAR) 100 MG tablet TAKE 1 TABLET BY MOUTH EVERY DAY  . [DISCONTINUED] polyethylene glycol powder (GLYCOLAX/MIRALAX) powder 255 grams one bottle for colonoscopy prep  . [DISCONTINUED] sertraline (ZOLOFT) 50 MG tablet TAKE 1 TABLET BY MOUTH EVERY DAY  . losartan (COZAAR) 100 MG tablet Take 1 tablet (100 mg total) by mouth daily.  . [DISCONTINUED] spironolactone (ALDACTONE) 50 MG tablet TAKE 1 TABLET (50 MG TOTAL) BY MOUTH DAILY. (Patient not taking: Reported on 01/09/2015)     No orders of the defined types were placed in this encounter.    No Follow-up on file.

## 2015-01-09 NOTE — Patient Instructions (Addendum)
Continue, the hctz,  Amlodipine ,  losartan and lisinopril for now  For your blood pressure  If your potassium is normal today,  We will increase the lisinopril to 40 mg daily   Check your sugars 2 hours after lunch Or 2 hours after dinner and bring that log back in one week  So I can review

## 2015-01-11 ENCOUNTER — Encounter: Payer: Self-pay | Admitting: Internal Medicine

## 2015-01-11 NOTE — Assessment & Plan Note (Signed)
No changes were made today since she did not bring her blood sugar log.

## 2015-01-11 NOTE — Assessment & Plan Note (Signed)
Increase lisinbopril to 40 mg daily  Lab Results  Component Value Date   CREATININE 0.88 01/09/2015  . Lab Results  Component Value Date   NA 136 01/09/2015   K 4.1 01/09/2015   CL 101 01/09/2015   CO2 23 01/09/2015

## 2015-01-11 NOTE — Assessment & Plan Note (Signed)
Resolved with suspension of spironolactone

## 2015-01-12 MED ORDER — LISINOPRIL 40 MG PO TABS: 40.0000 mg | ORAL_TABLET | Freq: Every day | ORAL | Status: DC

## 2015-01-12 NOTE — Addendum Note (Signed)
Addended by: Crecencio Mc on: 01/12/2015 09:22 AM   Modules accepted: Orders

## 2015-03-19 NOTE — Op Note (Signed)
PATIENT NAME:  Julie Dominguez, Julie Dominguez MR#:  [id number] DATE OF BIRTH:  1951/05/28  DATE OF PROCEDURE:  07/11/2012  PREOPERATIVE DIAGNOSIS:  Cataract, right eye.   POSTOPERATIVE DIAGNOSIS:  Cataract, right eye.  PROCEDURE PERFORMED:  Extracapsular cataract extraction using phacoemulsification with placement of an Alcon SN6CWS, 22.0-diopter posterior chamber lens, serial [id number].   SURGEON:  Loura Back. Ayden Hardwick, MD  ASSISTANT:  None.  ANESTHESIA:  4% lidocaine and 0.75% Marcaine in a 50/50 mixture with 10 units/mL of Hylenex added, given as a peribulbar.   ANESTHESIOLOGIST:  Dr. Kayleen Memos  COMPLICATIONS:  None.  ESTIMATED BLOOD LOSS:  Less than 1 ml.  DESCRIPTION OF PROCEDURE:  The patient was brought to the operating room and given a peribulbar block.  The patient was then prepped and draped in the usual fashion.  The vertical rectus muscles were imbricated using 5-0 silk sutures.  These sutures were then clamped to the sterile drapes as bridle sutures.  A limbal peritomy was performed extending two clock hours and hemostasis was obtained with cautery.  A partial thickness scleral groove was made at the surgical limbus and dissected anteriorly in a lamellar dissection using an Alcon crescent knife.  The anterior chamber was entered superonasally with a Superblade and through the lamellar dissection with a 2.6 mm keratome.  DisCoVisc was used to replace the aqueous and a continuous tear capsulorrhexis was carried out.  Hydrodissection and hydrodelineation were carried out with balanced salt and a 27 gauge canula.  The nucleus was rotated to confirm the effectiveness of the hydrodissection.  Phacoemulsification was carried out using a divide-and-conquer technique.  Total ultrasound time was 1 minute and 35 seconds with an average power of 19 percent and CDE 30.43.   Irrigation/aspiration was used to remove the residual cortex.  DisCoVisc was used to inflate the capsule and the internal incision  was enlarged to 3 mm with the crescent knife.  The intraocular lens was folded and inserted into the capsular bag using the AcrySert delivery system.  Irrigation/aspiration was used to remove the residual DisCoVisc.  Miostat was injected into the anterior chamber through the paracentesis track to inflate the anterior chamber and induce miosis.  The wound was checked for leaks and none were found. The conjunctiva was closed with cautery and the bridle sutures were removed.  Two drops of 0.3% Vigamox were placed on the eye.   An eye shield was placed on the eye.  The patient was discharged to the recovery room in good condition. ____________________________ Loura Back Mayah Urquidi, MD sad:slb D: 07/11/2012 12:38:02 ET T: 07/11/2012 13:12:14 ET JOB#: [id number]  cc: Remo Lipps A. Mikhayla Phillis, MD, <Dictator> Martie Lee MD ELECTRONICALLY SIGNED 07/18/2012 13:51

## 2015-03-19 NOTE — Op Note (Signed)
PATIENT NAME:  Julie Dominguez, Julie Dominguez MR#:  [id number] DATE OF BIRTH:  1951/01/03  DATE OF PROCEDURE:  09/12/2012  PREOPERATIVE DIAGNOSIS:  Cataract, left eye.    POSTOPERATIVE DIAGNOSIS:  Cataract, left eye.  PROCEDURE PERFORMED:  Extracapsular cataract extraction using phacoemulsification with placement of an Alcon SN6CWS 21.5-diopter posterior chamber lens, serial # [id number].  SURGEON:  Loura Back. Milanni Ayub, MD  ASSISTANT:  None.  ANESTHESIA:  4% lidocaine and 0.75% Marcaine in a 50/50 mixture with 10 units/mL of Hylenex added, given as a peribulbar.  ANESTHESIOLOGIST:  Dr. Kayleen Memos  COMPLICATIONS:  None.  ESTIMATED BLOOD LOSS:  Less than 1 mL  DESCRIPTION OF PROCEDURE:  The patient was brought to the operating room and given a peribulbar block.  The patient was then prepped and draped in the usual fashion.  The vertical rectus muscles were imbricated using 5-0 silk sutures.  These sutures were then clamped to the sterile drapes as bridle sutures.  A limbal peritomy was performed extending two clock hours and hemostasis was obtained with cautery.  A partial thickness scleral groove was made at the surgical limbus and dissected anteriorly in a lamellar dissection using an Alcon crescent knife.  The anterior chamber was entered supero-temporally with a Superblade and through the lamellar dissection with a 2.6 mm keratome.  DisCoVisc was used to replace the aqueous and a continuous tear capsulorrhexis was carried out.  Hydrodissection and hydrodelineation were carried out with balanced salt and a 27 gauge canula.  The nucleus was rotated to confirm the effectiveness of the hydrodissection.  Phacoemulsification was carried out using a divide-and-conquer technique.  Total ultrasound time was 1 minute and 40 seconds with an average power of 22.1 percent. CDE 35.48.  Irrigation/aspiration was used to remove the residual cortex.  DisCoVisc was used to inflate the capsule and the internal incision  was enlarged to 3 mm with the crescent knife.  The intraocular lens was folded and inserted into the capsular bag using the AcrySert delivery system.  Irrigation/aspiration was used to remove the residual DisCoVisc.  Miostat was injected into the anterior chamber through the paracentesis track to inflate the anterior chamber and induce miosis.  The wound was checked for leaks and none were found. The conjunctiva was closed with cautery and the bridle sutures were removed.  Two drops of 0.3% Vigamox were placed on the eye.   An eye shield was placed on the eye.  The patient was discharged to the recovery room in good condition.  ____________________________ Loura Back Herberto Ledwell, MD sad:cms D: 09/12/2012 14:00:51 ET T: 09/12/2012 14:11:56 ET JOB#: [id number]  cc: Remo Lipps A. Tisa Weisel, MD, <Dictator> Martie Lee MD ELECTRONICALLY SIGNED 09/19/2012 13:35

## 2015-03-21 ENCOUNTER — Other Ambulatory Visit: Payer: Self-pay | Admitting: Internal Medicine

## 2015-03-27 ENCOUNTER — Ambulatory Visit (INDEPENDENT_AMBULATORY_CARE_PROVIDER_SITE_OTHER): Payer: Medicare Other | Admitting: Internal Medicine

## 2015-03-27 ENCOUNTER — Encounter: Payer: Self-pay | Admitting: Internal Medicine

## 2015-03-27 VITALS — BP 160/74 | HR 99 | Temp 98.9°F | Resp 14 | Ht 63.0 in | Wt 138.5 lb

## 2015-03-27 DIAGNOSIS — R030 Elevated blood-pressure reading, without diagnosis of hypertension: Secondary | ICD-10-CM

## 2015-03-27 DIAGNOSIS — R928 Other abnormal and inconclusive findings on diagnostic imaging of breast: Secondary | ICD-10-CM | POA: Diagnosis not present

## 2015-03-27 DIAGNOSIS — E1165 Type 2 diabetes mellitus with hyperglycemia: Secondary | ICD-10-CM | POA: Diagnosis not present

## 2015-03-27 DIAGNOSIS — IMO0001 Reserved for inherently not codable concepts without codable children: Secondary | ICD-10-CM

## 2015-03-27 DIAGNOSIS — N179 Acute kidney failure, unspecified: Secondary | ICD-10-CM

## 2015-03-27 DIAGNOSIS — IMO0002 Reserved for concepts with insufficient information to code with codable children: Secondary | ICD-10-CM

## 2015-03-27 LAB — COMPREHENSIVE METABOLIC PANEL
ALBUMIN: 4.5 g/dL (ref 3.5–5.2)
ALT: 18 U/L (ref 0–35)
AST: 16 U/L (ref 0–37)
Alkaline Phosphatase: 120 U/L — ABNORMAL HIGH (ref 39–117)
BUN: 28 mg/dL — ABNORMAL HIGH (ref 6–23)
CALCIUM: 9.3 mg/dL (ref 8.4–10.5)
CO2: 23 meq/L (ref 19–32)
Chloride: 103 mEq/L (ref 96–112)
Creatinine, Ser: 1.03 mg/dL (ref 0.40–1.20)
GFR: 69 mL/min (ref >60.00)
GLUCOSE: 220 mg/dL — AB (ref 70–99)
POTASSIUM: 4.1 meq/L (ref 3.5–5.1)
Sodium: 137 mEq/L (ref 135–145)
Total Bilirubin: 0.5 mg/dL (ref 0.2–1.2)
Total Protein: 7.7 g/dL (ref 6.0–8.3)

## 2015-03-27 LAB — LDL CHOLESTEROL, DIRECT: Direct LDL: 174 mg/dL

## 2015-03-27 LAB — HEMOGLOBIN A1C: Hgb A1c MFr Bld: 8.3 % — ABNORMAL HIGH (ref 4.6–6.5)

## 2015-03-27 MED ORDER — INSULIN NPH (HUMAN) (ISOPHANE) 100 UNIT/ML ~~LOC~~ SUSP: SUBCUTANEOUS | Status: DC

## 2015-03-27 NOTE — Progress Notes (Signed)
Patient ID: Julie Dominguez, female   DOB: [birth year], 64 y.o.   MRN: [id number]   Patient Active Problem List   Diagnosis Date Noted  . Acute renal failure 12/25/2014  . Abnormal mammogram of left breast 10/02/2014  . Special screening for malignant neoplasms, colon 02/21/2014  . Screening for breast cancer 03/02/2013  . Screening for colon cancer 03/02/2013  . Screening for cervical cancer 03/02/2013  . GAD (generalized anxiety disorder) 05/08/2012  . Cataract associated with another disorder 05/08/2012  . Diabetes mellitus type II, uncontrolled 05/01/2012  . White coat hypertension   . Hyperlipidemia     Subjective:  CC:   Chief Complaint  Patient presents with  . Follow-up  . Diabetes    HPI:   Julie Dominguez is a 64 y.o. female who presents for   3 month follow up on DM type 2,  Hypertension with white coat syndrome  Blood sugars have been labile due to lapses in insulin use due to cost.   The NPH vial is costing her $30  Per vial and she is using 15 untis am amd 25 in the pM ) whic is sing 40 units per day or 1200 units per month 2 hr post prandials after lunch 119 to 140   appt with  eye is on hold until she pays her bill with them. No vision changes.     Some trouble sleeping due to daytime naps,   starting to go for walk daily  Due for 6 month follow up on left breast mammographic abnormality   Foot exam done    Past Medical History  Diagnosis Date  . Diabetes mellitus 2007  . Hypertension 1988    uncontrolled   . Hyperlipidemia     Past Surgical History  Procedure Laterality Date  . Eye surgery Bilateral August 2013, Oct 2013    catraract with lens implant dingledein        The following portions of the patient's history were reviewed and updated as appropriate: Allergies, current medications, and problem list.    Review of Systems:   Patient denies headache, fevers, malaise, unintentional weight loss, skin rash, eye  pain, sinus congestion and sinus pain, sore throat, dysphagia,  hemoptysis , cough, dyspnea, wheezing, chest pain, palpitations, orthopnea, edema, abdominal pain, nausea, melena, diarrhea, constipation, flank pain, dysuria, hematuria, urinary  Frequency, nocturia, numbness, tingling, seizures,  Focal weakness, Loss of consciousness,  Tremor, insomnia, depression, anxiety, and suicidal ideation.     History   Social History  . Marital Status: Single    Spouse Name: N/A  . Number of Children: N/A  . Years of Education: N/A   Occupational History  . Not on file.   Social History Main Topics  . Smoking status: Never Smoker   . Smokeless tobacco: Never Used  . Alcohol Use: No  . Drug Use: No  . Sexual Activity: Not Currently   Other Topics Concern  . Not on file   Social History Narrative    Objective:  Filed Vitals:   03/27/15 1009  BP: 160/74  Pulse: 99  Temp: 98.9 F (37.2 C)  Resp: 14     General appearance: alert, cooperative and appears stated age Ears: normal TM's and external ear canals both ears Throat: lips, mucosa, and tongue normal; teeth and gums normal Neck: no adenopathy, no carotid bruit, supple, symmetrical, trachea midline and thyroid not enlarged, symmetric, no tenderness/mass/nodules Back: symmetric, no curvature. ROM normal. No CVA tenderness. Lungs: clear  to auscultation bilaterally Heart: regular rate and rhythm, S1, S2 normal, no murmur, click, rub or gallop Abdomen: soft, non-tender; bowel sounds normal; no masses,  no organomegaly Pulses: 2+ and symmetric Skin: Skin color, texture, turgor normal. No rashes or lesions Lymph nodes: Cervical, supraclavicular, and axillary nodes normal.  Assessment and Plan:  Diabetes mellitus type II, uncontrolled Slight loss of  control  on current medications.  hemoglobin A1c is > 8.0  At last visit her  Insulin was increased,  her NPH dose  evening and morning to 24 units and 15 units respectively.   Patient  is tolerating statin therapy for CAD risk reduction and on ACE/ARB for reduction in proteinuria..  Reminder for annual eye exam given.     Lab Results  Component Value Date   HGBA1C 8.3* 03/27/2015   Lab Results  Component Value Date   MICROALBUR 3.0* 12/24/2014   No changes were made today since she did not bring her blood sugar log.        Acute renal failure Resolved with suspension of spironolactone  Lab Results  Component Value Date   CREATININE 1.03 03/27/2015   Lab Results  Component Value Date   NA 137 03/27/2015   K 4.1 03/27/2015   CL 103 03/27/2015   CO2 23 03/27/2015      White coat hypertension Patient has been aksed to bring her home BP cuff with her to office for validation . No changs today.  e Lab Results  Component Value Date   CREATININE 1.03 03/27/2015  . Lab Results  Component Value Date   NA 137 03/27/2015   K 4.1 03/27/2015   CL 103 03/27/2015   CO2 23 03/27/2015         Updated Medication List Outpatient Encounter Prescriptions as of 03/27/2015  Medication Sig  . amLODipine (NORVASC) 10 MG tablet TAKE 1 TABLET (10 MG TOTAL) BY MOUTH DAILY.  . cloNIDine (CATAPRES) 0.2 MG tablet TAKE 1 TABLET BY MOUTH TWICE A DAY  . glipiZIDE (GLUCOTROL) 10 MG tablet TAKE 1 TABLET BY MOUTH TWICE A DAY BEFORE MEALS  . hydrochlorothiazide (HYDRODIURIL) 25 MG tablet Take 1 tablet (25 mg total) by mouth daily.  . hydrochlorothiazide (HYDRODIURIL) 25 MG tablet TAKE 1 TABLET (25 MG TOTAL) BY MOUTH DAILY.  . INS SYRINGE/NEEDLE 1CC/28G (B-D INSULIN SYRINGE 1CC/28G) 28G X 1/2" 1 ML MISC 1 Syringe by Does not apply route 2 (two) times daily.  . insulin NPH Human (NOVOLIN N) 100 UNIT/ML injection 24 units at bedtime,  15 units at breakfast  . lisinopril (PRINIVIL,ZESTRIL) 40 MG tablet Take 1 tablet (40 mg total) by mouth daily.  Marland Kitchen losartan (COZAAR) 100 MG tablet TAKE 1 TABLET BY MOUTH EVERY DAY  . metFORMIN (GLUCOPHAGE) 850 MG tablet Take 1 tablet (850 mg  total) by mouth 2 (two) times daily with a meal.  . ONE TOUCH ULTRA TEST test strip TEST SUGAR TWICE A DAY  . simvastatin (ZOCOR) 40 MG tablet TAKE 1 TABLET BY MOUTH IN THE EVENING  . Tdap (BOOSTRIX) 5-2.5-18.5 LF-MCG/0.5 injection Inject 0.5 mLs into the muscle once.  . [DISCONTINUED] insulin NPH Human (NOVOLIN N) 100 UNIT/ML injection 24 units at bedtime,  15 units at breakfast  . losartan (COZAAR) 100 MG tablet Take 1 tablet (100 mg total) by mouth daily.  . [DISCONTINUED] amLODipine (NORVASC) 10 MG tablet TAKE 1 TABLET (10 MG TOTAL) BY MOUTH DAILY.     Orders Placed This Encounter  Procedures  . MM Digital  Diagnostic Unilat L  . Comprehensive metabolic panel  . Hemoglobin A1c  . LDL cholesterol, direct    Return in about 3 months (around 06/26/2015) for follow up diabetes.

## 2015-03-27 NOTE — Patient Instructions (Addendum)
Please Return with your home blood pressure cuff so we can make sure it is accurate .  This will be an RN visit with Juliann Pulse  Please find out if your insurance will apy for a diabetic eye exam elsewhere,  If [facility] will not see you until your bill is paid off  Go for a walk in the evening after your dinner; t will lower your blood sugars  Managing Your High Blood Pressure Blood pressure is a measurement of how forceful your blood is pressing against the walls of the arteries. Arteries are muscular tubes within the circulatory system. Blood pressure does not stay the same. Blood pressure rises when you are active, excited, or nervous; and it lowers during sleep and relaxation. If the numbers measuring your blood pressure stay above normal most of the time, you are at risk for health problems. High blood pressure (hypertension) is a long-term (chronic) condition in which blood pressure is elevated. A blood pressure reading is recorded as two numbers, such as 120 over 80 (or 120/80). The first, higher number is called the systolic pressure. It is a measure of the pressure in your arteries as the heart beats. The second, lower number is called the diastolic pressure. It is a measure of the pressure in your arteries as the heart relaxes between beats.  Keeping your blood pressure in a normal range is important to your overall health and prevention of health problems, such as heart disease and stroke. When your blood pressure is uncontrolled, your heart has to work harder than normal. High blood pressure is a very common condition in adults because blood pressure tends to rise with age. Men and women are equally likely to have hypertension but at different times in life. Before age 41, men are more likely to have hypertension. After 66 years of age, women are more likely to have it. Hypertension is especially common in African Americans. This condition often has no signs or symptoms. The cause of the  condition is usually not known. Your caregiver can help you come up with a plan to keep your blood pressure in a normal, healthy range. BLOOD PRESSURE STAGES Blood pressure is classified into four stages: normal, prehypertension, stage 1, and stage 2. Your blood pressure reading will be used to determine what type of treatment, if any, is necessary. Appropriate treatment options are tied to these four stages:  Normal  Systolic pressure (mm Hg): below 120.  Diastolic pressure (mm Hg): below 80. Prehypertension  Systolic pressure (mm Hg): 120 to 139.  Diastolic pressure (mm Hg): 80 to 89. Stage1  Systolic pressure (mm Hg): 140 to 159.  Diastolic pressure (mm Hg): 90 to 99. Stage2  Systolic pressure (mm Hg): 160 or above.  Diastolic pressure (mm Hg): 100 or above. RISKS RELATED TO HIGH BLOOD PRESSURE Managing your blood pressure is an important responsibility. Uncontrolled high blood pressure can lead to:  A heart attack.  A stroke.  A weakened blood vessel (aneurysm).  Heart failure.  Kidney damage.  Eye damage.  Metabolic syndrome.  Memory and concentration problems. HOW TO MANAGE YOUR BLOOD PRESSURE Blood pressure can be managed effectively with lifestyle changes and medicines (if needed). Your caregiver will help you come up with a plan to bring your blood pressure within a normal range. Your plan should include the following: Education  Read all information provided by your caregivers about how to control blood pressure.  Educate yourself on the latest guidelines and treatment recommendations. New research  is always being done to further define the risks and treatments for high blood pressure. Lifestylechanges  Control your weight.  Avoid smoking.  Stay physically active.  Reduce the amount of salt in your diet.  Reduce stress.  Control any chronic conditions, such as high cholesterol or diabetes.  Reduce your alcohol intake. Medicines  Several  medicines (antihypertensive medicines) are available, if needed, to bring blood pressure within a normal range. Communication  Review all the medicines you take with your caregiver because there may be side effects or interactions.  Talk with your caregiver about your diet, exercise habits, and other lifestyle factors that may be contributing to high blood pressure.  See your caregiver regularly. Your caregiver can help you create and adjust your plan for managing high blood pressure. RECOMMENDATIONS FOR TREATMENT AND FOLLOW-UP  The following recommendations are based on current guidelines for managing high blood pressure in nonpregnant adults. Use these recommendations to identify the proper follow-up period or treatment option based on your blood pressure reading. You can discuss these options with your caregiver.  Systolic pressure of 123456 to [id number] or diastolic pressure of 80 to 89: Follow up with your caregiver as directed.  Systolic pressure of XX123456 to 0000000 or diastolic pressure of 90 to 100: Follow up with your caregiver within 2 months.  Systolic pressure above 0000000 or diastolic pressure above 123XX123: Follow up with your caregiver within 1 month.  Systolic pressure above [id number] or diastolic pressure above [id number]: Consider antihypertensive therapy; follow up with your caregiver within 1 week.  Systolic pressure above A999333 or diastolic pressure above 123456: Begin antihypertensive therapy; follow up with your caregiver within 1 week. Document Released: 08/10/2012 Document Reviewed: 08/10/2012 [facility] Patient Information 2015 [place]. This information is not intended to replace advice given to you by your health care provider. Make sure you discuss any questions you have with your health care provider.

## 2015-03-27 NOTE — Progress Notes (Signed)
Pre-visit discussion using our clinic review tool. No additional management support is needed unless otherwise documented below in the visit note.  

## 2015-03-30 ENCOUNTER — Encounter: Payer: Self-pay | Admitting: Internal Medicine

## 2015-03-30 NOTE — Assessment & Plan Note (Signed)
Resolved with suspension of spironolactone  Lab Results  Component Value Date   CREATININE 1.03 03/27/2015   Lab Results  Component Value Date   NA 137 03/27/2015   K 4.1 03/27/2015   CL 103 03/27/2015   CO2 23 03/27/2015

## 2015-03-30 NOTE — Assessment & Plan Note (Addendum)
Slight loss of  control  on current medications.  hemoglobin A1c is > 8.0  At last visit her  Insulin was increased,  her NPH dose  evening and morning to 24 units and 15 units respectively.   Patient is tolerating statin therapy for CAD risk reduction and on ACE/ARB for reduction in proteinuria..  Reminder for annual eye exam given.     Lab Results  Component Value Date   HGBA1C 8.3* 03/27/2015   Lab Results  Component Value Date   MICROALBUR 3.0* 12/24/2014   No changes were made today since she did not bring her blood sugar log.

## 2015-03-30 NOTE — Assessment & Plan Note (Signed)
Patient has been aksed to bring her home BP cuff with her to office for validation . No changs today.  e Lab Results  Component Value Date   CREATININE 1.03 03/27/2015  . Lab Results  Component Value Date   NA 137 03/27/2015   K 4.1 03/27/2015   CL 103 03/27/2015   CO2 23 03/27/2015

## 2015-04-12 ENCOUNTER — Ambulatory Visit: Payer: Medicare Other | Admitting: Internal Medicine

## 2015-04-19 ENCOUNTER — Other Ambulatory Visit: Payer: Self-pay | Admitting: Internal Medicine

## 2015-04-23 ENCOUNTER — Other Ambulatory Visit: Payer: Self-pay | Admitting: Internal Medicine

## 2015-04-26 ENCOUNTER — Other Ambulatory Visit: Payer: Self-pay | Admitting: Internal Medicine

## 2015-04-26 ENCOUNTER — Encounter: Payer: Self-pay | Admitting: Internal Medicine

## 2015-04-26 ENCOUNTER — Ambulatory Visit (INDEPENDENT_AMBULATORY_CARE_PROVIDER_SITE_OTHER): Payer: Medicare Other | Admitting: Internal Medicine

## 2015-04-26 VITALS — BP 162/60 | HR 92 | Temp 98.5°F | Resp 12 | Ht 63.0 in | Wt 141.0 lb

## 2015-04-26 DIAGNOSIS — E1165 Type 2 diabetes mellitus with hyperglycemia: Secondary | ICD-10-CM

## 2015-04-26 DIAGNOSIS — IMO0002 Reserved for concepts with insufficient information to code with codable children: Secondary | ICD-10-CM

## 2015-04-26 MED ORDER — CLONIDINE HCL 0.2 MG PO TABS: 0.2000 mg | ORAL_TABLET | Freq: Two times a day (BID) | ORAL | Status: DC

## 2015-04-26 MED ORDER — SIMVASTATIN 40 MG PO TABS: 40.0000 mg | ORAL_TABLET | Freq: Every evening | ORAL | Status: DC

## 2015-04-26 MED ORDER — INSULIN NPH (HUMAN) (ISOPHANE) 100 UNIT/ML ~~LOC~~ SUSP: SUBCUTANEOUS | Status: DC

## 2015-04-26 NOTE — Progress Notes (Signed)
Subjective:  Patient ID: Julie Dominguez, female    DOB: 11/12/1951  Age: 64 y.o. MRN: [id number]  CC: The encounter diagnosis was Diabetes mellitus type II, uncontrolled.  HPI [facility] presents for  One month  follow  Up on  DM , hypertension, etc .  Her blood sugars have been previously  uncontrolled due to medication noncompliance due to cost of insulin.  She is now using 25 units of NPH at bedtime ,  10 mg glipizide with dinner  and 15 units of NPH and 10 mg glipizide in the morning before breakfast.   The highest sugars are are from 2 pm to 4 pm  accoding to the log she has brought in today.  She is not skipping meals ad denies any lows.  Diet was reviewed today.     Outpatient Prescriptions Prior to Visit  Medication Sig Dispense Refill  . amLODipine (NORVASC) 10 MG tablet TAKE 1 TABLET (10 MG TOTAL) BY MOUTH DAILY. 90 tablet 1  . glipiZIDE (GLUCOTROL) 10 MG tablet TAKE 1 TABLET BY MOUTH TWICE A DAY BEFORE MEALS 180 tablet 1  . hydrochlorothiazide (HYDRODIURIL) 25 MG tablet Take 1 tablet (25 mg total) by mouth daily. 90 tablet 3  . INS SYRINGE/NEEDLE 1CC/28G (B-D INSULIN SYRINGE 1CC/28G) 28G X 1/2" 1 ML MISC 1 Syringe by Does not apply route 2 (two) times daily. 100 each 11  . lisinopril (PRINIVIL,ZESTRIL) 40 MG tablet Take 1 tablet (40 mg total) by mouth daily. 90 tablet 1  . losartan (COZAAR) 100 MG tablet TAKE 1 TABLET BY MOUTH EVERY DAY 90 tablet 1  . metFORMIN (GLUCOPHAGE) 850 MG tablet Take 1 tablet (850 mg total) by mouth 2 (two) times daily with a meal. 180 tablet 3  . ONE TOUCH ULTRA TEST test strip TEST SUGAR TWICE A DAY 100 each 3  . cloNIDine (CATAPRES) 0.2 MG tablet TAKE 1 TABLET BY MOUTH TWICE A DAY 60 tablet 5  . insulin NPH Human (NOVOLIN N) 100 UNIT/ML injection 24 units at bedtime,  15 units at breakfast 20 mL 11  . simvastatin (ZOCOR) 40 MG tablet TAKE 1 TABLET BY MOUTH IN THE EVENING 30 tablet 5  . hydrochlorothiazide (HYDRODIURIL) 25 MG tablet  TAKE 1 TABLET (25 MG TOTAL) BY MOUTH DAILY. 90 tablet 2  . losartan (COZAAR) 100 MG tablet Take 1 tablet (100 mg total) by mouth daily. 90 tablet 3  . Tdap (BOOSTRIX) 5-2.5-18.5 LF-MCG/0.5 injection Inject 0.5 mLs into the muscle once. 0.5 mL 0   No facility-administered medications prior to visit.    Review of Systems;  Patient denies headache, fevers, malaise, unintentional weight loss, skin rash, eye pain, sinus congestion and sinus pain, sore throat, dysphagia,  hemoptysis , cough, dyspnea, wheezing, chest pain, palpitations, orthopnea, edema, abdominal pain, nausea, melena, diarrhea, constipation, flank pain, dysuria, hematuria, urinary  Frequency, nocturia, numbness, tingling, seizures,  Focal weakness, Loss of consciousness,  Tremor, insomnia, depression, anxiety, and suicidal ideation.      Objective:  BP 162/60 mmHg  Pulse 92  Temp(Src) 98.5 F (36.9 C) (Oral)  Resp 12  Ht 5\' 3"  (1.6 m)  Wt 141 lb (63.957 kg)  BMI 24.98 kg/m2  SpO2 99%  BP Readings from Last 3 Encounters:  04/26/15 162/60  03/27/15 160/74  01/09/15 190/86    Wt Readings from Last 3 Encounters:  04/26/15 141 lb (63.957 kg)  03/27/15 138 lb 8 oz (62.823 kg)  01/09/15 142 lb 8 oz (64.638 kg)  General appearance: alert, cooperative and appears stated age  Lungs: clear to auscultation bilaterally Heart: regular rate and rhythm, S1, S2 normal, no murmur, click, rub or gallop Pulses: 2+ and symmetric Skin: Skin color, texture, turgor normal. No rashes or lesions   Lab Results  Component Value Date   HGBA1C 8.3* 03/27/2015   HGBA1C 7.9* 12/24/2014   HGBA1C 7.9* 08/27/2014    Lab Results  Component Value Date   CREATININE 1.03 03/27/2015   CREATININE 0.88 01/09/2015   CREATININE 1.64* 12/24/2014    Lab Results  Component Value Date   WBC 6.1 04/22/2012   HGB 13.2 04/22/2012   HCT 41.7 04/22/2012   PLT 191 04/22/2012   GLUCOSE 220* 03/27/2015   CHOL 169 12/24/2014   TRIG 97.0  12/24/2014   HDL 43.40 12/24/2014   LDLDIRECT 174.0 03/27/2015   LDLCALC 106* 12/24/2014   ALT 18 03/27/2015   AST 16 03/27/2015   NA 137 03/27/2015   K 4.1 03/27/2015   CL 103 03/27/2015   CREATININE 1.03 03/27/2015   BUN 28* 03/27/2015   CO2 23 03/27/2015   HGBA1C 8.3* 03/27/2015   MICROALBUR 3.0* 12/24/2014    No results found.  Assessment & Plan:   Problem List Items Addressed This Visit    Diabetes mellitus type II, uncontrolled - Primary    Improving control due to increased adherence.  Increased NPH dose to 17 units in the AM with breakfast. Return in late July for labs and OV       Relevant Medications   simvastatin (ZOCOR) 40 MG tablet   insulin NPH Human (NOVOLIN N) 100 UNIT/ML injection      I have discontinued Julie Dominguez's Tdap. I have also changed her cloNIDine, simvastatin, and insulin NPH Human. Additionally, I am having her maintain her ONE TOUCH ULTRA TEST, INS SYRINGE/NEEDLE 1CC/28G, amLODipine, hydrochlorothiazide, metFORMIN, losartan, lisinopril, and glipiZIDE.  Meds ordered this encounter  Medications  . cloNIDine (CATAPRES) 0.2 MG tablet    Sig: Take 1 tablet (0.2 mg total) by mouth 2 (two) times daily.    Dispense:  60 tablet    Refill:  5  . simvastatin (ZOCOR) 40 MG tablet    Sig: Take 1 tablet (40 mg total) by mouth every evening.    Dispense:  90 tablet    Refill:  2  . insulin NPH Human (NOVOLIN N) 100 UNIT/ML injection    Sig: 25 units at bedtime,  17 units at breakfast    Dispense:  20 mL    Refill:  11    250.02 (uncontrolled diabetes )    Medications Discontinued During This Encounter  Medication Reason  . hydrochlorothiazide (HYDRODIURIL) 25 MG tablet Duplicate  . losartan (COZAAR) 123XX123 MG tablet Duplicate  . Tdap (BOOSTRIX) 5-2.5-18.5 LF-MCG/0.5 injection Error  . cloNIDine (CATAPRES) 0.2 MG tablet Reorder  . simvastatin (ZOCOR) 40 MG tablet Reorder  . insulin NPH Human (NOVOLIN N) 100 UNIT/ML injection Reorder    Follow-up:  Return in about 2 months (around 06/26/2015) for follow up diabetes.   Crecencio Mc, MD

## 2015-04-26 NOTE — Progress Notes (Signed)
Pre visit review using our clinic review tool, if applicable. No additional management support is needed unless otherwise documented below in the visit note. 

## 2015-04-26 NOTE — Patient Instructions (Addendum)
Your blood sugars seem to be improving .  We are making progress!!  Increase the morning insulin ose to to 17 units.  This should make the 3PM sugars lower  Return in  Late July and get fasting labs prior to the visit but not before Jul 27,

## 2015-04-29 ENCOUNTER — Encounter: Payer: Self-pay | Admitting: Internal Medicine

## 2015-04-29 NOTE — Assessment & Plan Note (Signed)
Improving control due to increased adherence.  Increased NPH dose to 17 units in the AM with breakfast. Return in late July for labs and OV

## 2015-05-23 ENCOUNTER — Other Ambulatory Visit: Payer: Self-pay | Admitting: Internal Medicine

## 2015-05-27 ENCOUNTER — Other Ambulatory Visit: Payer: Self-pay | Admitting: Internal Medicine

## 2015-06-26 ENCOUNTER — Ambulatory Visit: Payer: Medicare Other | Admitting: Internal Medicine

## 2015-07-01 ENCOUNTER — Ambulatory Visit: Payer: Self-pay | Admitting: Internal Medicine

## 2015-07-01 ENCOUNTER — Other Ambulatory Visit: Payer: Medicare Other

## 2015-07-03 ENCOUNTER — Ambulatory Visit: Payer: Medicare Other | Admitting: Internal Medicine

## 2015-07-24 ENCOUNTER — Encounter (INDEPENDENT_AMBULATORY_CARE_PROVIDER_SITE_OTHER): Payer: Self-pay

## 2015-07-24 ENCOUNTER — Telehealth: Payer: Self-pay | Admitting: *Deleted

## 2015-07-24 ENCOUNTER — Other Ambulatory Visit (INDEPENDENT_AMBULATORY_CARE_PROVIDER_SITE_OTHER): Payer: Medicare Other

## 2015-07-24 DIAGNOSIS — E1129 Type 2 diabetes mellitus with other diabetic kidney complication: Secondary | ICD-10-CM

## 2015-07-24 DIAGNOSIS — R809 Proteinuria, unspecified: Secondary | ICD-10-CM

## 2015-07-24 DIAGNOSIS — E119 Type 2 diabetes mellitus without complications: Secondary | ICD-10-CM

## 2015-07-24 DIAGNOSIS — E785 Hyperlipidemia, unspecified: Secondary | ICD-10-CM

## 2015-07-24 LAB — LIPID PANEL
Cholesterol: 173 mg/dL (ref 0–200)
HDL: 40.1 mg/dL (ref >39.00)
LDL Cholesterol: 110 mg/dL — ABNORMAL HIGH (ref 0–99)
NONHDL: 132.58
Total CHOL/HDL Ratio: 4
Triglycerides: 114 mg/dL (ref 0.0–149.0)
VLDL: 22.8 mg/dL (ref 0.0–40.0)

## 2015-07-24 LAB — COMPREHENSIVE METABOLIC PANEL
ALBUMIN: 4.2 g/dL (ref 3.5–5.2)
ALK PHOS: 117 U/L (ref 39–117)
ALT: 15 U/L (ref 0–35)
AST: 16 U/L (ref 0–37)
BILIRUBIN TOTAL: 0.5 mg/dL (ref 0.2–1.2)
BUN: 34 mg/dL — AB (ref 6–23)
CALCIUM: 9.3 mg/dL (ref 8.4–10.5)
CO2: 23 mEq/L (ref 19–32)
CREATININE: 1.16 mg/dL (ref 0.40–1.20)
Chloride: 102 mEq/L (ref 96–112)
GFR: 60.1 mL/min (ref >60.00)
Glucose, Bld: 149 mg/dL — ABNORMAL HIGH (ref 70–99)
Potassium: 4.9 mEq/L (ref 3.5–5.1)
SODIUM: 138 meq/L (ref 135–145)
TOTAL PROTEIN: 7.5 g/dL (ref 6.0–8.3)

## 2015-07-24 LAB — LDL CHOLESTEROL, DIRECT: LDL DIRECT: 101 mg/dL

## 2015-07-24 LAB — HEMOGLOBIN A1C: HEMOGLOBIN A1C: 8.8 % — AB (ref 4.6–6.5)

## 2015-07-24 NOTE — Telephone Encounter (Signed)
Labs and dx?  

## 2015-07-25 ENCOUNTER — Ambulatory Visit (INDEPENDENT_AMBULATORY_CARE_PROVIDER_SITE_OTHER): Payer: Medicare Other | Admitting: Internal Medicine

## 2015-07-25 ENCOUNTER — Encounter: Payer: Self-pay | Admitting: Internal Medicine

## 2015-07-25 ENCOUNTER — Encounter (INDEPENDENT_AMBULATORY_CARE_PROVIDER_SITE_OTHER): Payer: Self-pay

## 2015-07-25 ENCOUNTER — Telehealth: Payer: Self-pay | Admitting: Internal Medicine

## 2015-07-25 VITALS — BP 162/86 | HR 68 | Temp 98.2°F | Resp 14 | Ht 63.0 in | Wt 135.5 lb

## 2015-07-25 DIAGNOSIS — R030 Elevated blood-pressure reading, without diagnosis of hypertension: Secondary | ICD-10-CM | POA: Diagnosis not present

## 2015-07-25 DIAGNOSIS — IMO0001 Reserved for inherently not codable concepts without codable children: Secondary | ICD-10-CM

## 2015-07-25 DIAGNOSIS — E1129 Type 2 diabetes mellitus with other diabetic kidney complication: Secondary | ICD-10-CM

## 2015-07-25 DIAGNOSIS — Z23 Encounter for immunization: Secondary | ICD-10-CM

## 2015-07-25 DIAGNOSIS — IMO0002 Reserved for concepts with insufficient information to code with codable children: Secondary | ICD-10-CM

## 2015-07-25 DIAGNOSIS — R928 Other abnormal and inconclusive findings on diagnostic imaging of breast: Secondary | ICD-10-CM

## 2015-07-25 DIAGNOSIS — E1165 Type 2 diabetes mellitus with hyperglycemia: Secondary | ICD-10-CM

## 2015-07-25 MED ORDER — CLONIDINE HCL 0.2 MG PO TABS: 0.2000 mg | ORAL_TABLET | Freq: Two times a day (BID) | ORAL | Status: DC

## 2015-07-25 NOTE — Progress Notes (Signed)
Pre-visit discussion using our clinic review tool. No additional management support is needed unless otherwise documented below in the visit note.  

## 2015-07-25 NOTE — Progress Notes (Addendum)
Subjective:  Patient ID: Julie Dominguez, female    DOB: May 28, 1951  Age: 64 y.o. MRN: [id number]  CC: The primary encounter diagnosis was Encounter for immunization. 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  HPI [facility] presents for follow up on Type 2 DM,  Hypertension with white coat syndrome .  1) DM: she had a lapse of insulin lasting 4 weeks due to financial difficulties.  Sugars were elevated to 250, resumed insulin yesterday and fasting sugar was 150 this morning  .  Her insulin costs her $30 a month for NPH.  She continued  Her other meds bc they were only $5 or less per rx  She  feels generally well, is exercising several times per week   Denies any recent hypoglyemic events. Following a carbohydrate modified diet 6 days per week. Denies numbness, burning and tingling of extremities. Appetite is good.     HTN: she checks her BP around once per week and readings have been < 140/80.    Outpatient Prescriptions Prior to Visit  Medication Sig Dispense Refill  . amLODipine (NORVASC) 10 MG tablet TAKE 1 TABLET (10 MG TOTAL) BY MOUTH DAILY. 90 tablet 1  . glipiZIDE (GLUCOTROL) 10 MG tablet TAKE 1 TABLET BY MOUTH TWICE A DAY BEFORE MEALS 180 tablet 1  . hydrochlorothiazide (HYDRODIURIL) 25 MG tablet Take 1 tablet (25 mg total) by mouth daily. 90 tablet 3  . INS SYRINGE/NEEDLE 1CC/28G (B-D INSULIN SYRINGE 1CC/28G) 28G X 1/2" 1 ML MISC 1 Syringe by Does not apply route 2 (two) times daily. 100 each 11  . insulin NPH Human (NOVOLIN N) 100 UNIT/ML injection 25 units at bedtime,  17 units at breakfast 20 mL 11  . lisinopril (PRINIVIL,ZESTRIL) 40 MG tablet Take 1 tablet (40 mg total) by mouth daily. 90 tablet 1  . losartan (COZAAR) 100 MG tablet TAKE 1 TABLET BY MOUTH EVERY DAY 90 tablet 1  . metFORMIN (GLUCOPHAGE) 850 MG tablet Take 1 tablet (850 mg  total) by mouth 2 (two) times daily with a meal. 180 tablet 3  . ONE TOUCH ULTRA TEST test strip TEST SUGAR TWICE A DAY 100 each 3  . simvastatin (ZOCOR) 40 MG tablet Take 1 tablet (40 mg total) by mouth every evening. 90 tablet 2  . cloNIDine (CATAPRES) 0.2 MG tablet Take 1 tablet (0.2 mg total) by mouth 2 (two) times daily. 60 tablet 5   No facility-administered medications prior to visit.    Review of Systems;  Patient denies headache, fevers, malaise, unintentional weight loss, skin rash, eye pain, sinus congestion and sinus pain, sore throat, dysphagia,  hemoptysis , cough, dyspnea, wheezing, chest pain, palpitations, orthopnea, edema, abdominal pain, nausea, melena, diarrhea, constipation, flank pain, dysuria, hematuria, urinary  Frequency, nocturia, numbness, tingling, seizures,  Focal weakness, Loss of consciousness,  Tremor, insomnia, depression, anxiety, and suicidal ideation.      Objective:  BP 162/86 mmHg  Pulse 68  Temp(Src) 98.2 F (36.8 C) (Oral)  Resp 14  Ht 5\' 3"  (1.6 m)  Wt 135 lb 8 oz (61.462 kg)  BMI 24.01 kg/m2  SpO2 99%  BP Readings from Last 3 Encounters:  07/25/15 162/86  04/26/15 162/60  03/27/15 160/74    Wt Readings from Last 3 Encounters:  07/25/15 135 lb 8 oz (61.462 kg)  04/26/15 141 lb (63.957 kg)  03/27/15 138 lb 8 oz (62.823  kg)    General appearance: alert, cooperative and appears stated age Ears: normal TM's and external ear canals both ears Throat: lips, mucosa, and tongue normal; teeth and gums normal Neck: no adenopathy, no carotid bruit, supple, symmetrical, trachea midline and thyroid not enlarged, symmetric, no tenderness/mass/nodules Back: symmetric, no curvature. ROM normal. No CVA tenderness. Lungs: clear to auscultation bilaterally Heart: regular rate and rhythm, S1, S2 normal, no murmur, click, rub or gallop Abdomen: soft, non-tender; bowel sounds normal; no masses,  no organomegaly Pulses: 2+ and symmetric Skin: Skin color,  texture, turgor normal. No rashes or lesions Lymph nodes: Cervical, supraclavicular, and axillary nodes normal.  Lab Results  Component Value Date   HGBA1C 8.8* 07/24/2015   HGBA1C 8.3* 03/27/2015   HGBA1C 7.9* 12/24/2014    Lab Results  Component Value Date   CREATININE 1.16 07/24/2015   CREATININE 1.03 03/27/2015   CREATININE 0.88 01/09/2015    Lab Results  Component Value Date   WBC 6.1 04/22/2012   HGB 13.2 04/22/2012   HCT 41.7 04/22/2012   PLT 191 04/22/2012   GLUCOSE 149* 07/24/2015   CHOL 173 07/24/2015   TRIG 114.0 07/24/2015   HDL 40.10 07/24/2015   LDLDIRECT 101.0 07/24/2015   LDLCALC 110* 07/24/2015   ALT 15 07/24/2015   AST 16 07/24/2015   NA 138 07/24/2015   K 4.9 07/24/2015   CL 102 07/24/2015   CREATININE 1.16 07/24/2015   BUN 34* 07/24/2015   CO2 23 07/24/2015   HGBA1C 8.8* 07/24/2015   MICROALBUR 3.0* 12/24/2014    No results found.  Assessment & Plan:   Problem List Items Addressed This Visit      Unprioritized   White coat hypertension    Home BPs have been at or below goal. No changes today.  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  Advised to resume insulin with instructions to increased NPH in the evening gradually until fasting CBG is < 130.  Lab Results  Component Value Date   HGBA1C 8.8* 07/24/2015   Lab Results  Component Value Date   MICROALBUR 3.0* 12/24/2014        Abnormal mammogram of left breast    She did not have 6 month follow up in April, mammogram ordered today       Relevant Orders   MM Digital Diagnostic Unilat L   US BREAST COMPLETE UNI LEFT INC AXILLA    Other Visit Diagnoses     Encounter for immunization    -  Primary       I am having Ms. Viscardi maintain her ONE TOUCH ULTRA TEST, INS SYRINGE/NEEDLE 1CC/28G, amLODipine, hydrochlorothiazide, metFORMIN, lisinopril, glipiZIDE, simvastatin, insulin NPH Human, losartan, and cloNIDine.  Meds ordered this encounter  Medications  . cloNIDine (CATAPRES) 0.2 MG tablet    Sig: Take 1 tablet (0.2 mg total) by mouth 2 (two) times daily.    Dispense:  180 tablet    Refill:  1    Keep on file for next refill if not due yet    Medications Discontinued During This Encounter  Medication Reason  .  cloNIDine (CATAPRES) 0.2 MG tablet Reorder    Follow-up: Return in about 3 months (around 10/25/2015) for follow up diabetes.   Crecencio Mc, MD

## 2015-07-25 NOTE — Patient Instructions (Addendum)
Please continue to check your blood pressure once a week at the pharmacy and call if radings are over 130/80    If your morning blood sugar is not < 130 by next Monday,  Increase the NPH dose at bedtime to 28 units .Marland Kitchen   I strongly recommend that you get the TDaP vaccine at [facility] .  It is good for ten years for protection against tetanus,  And for lifetime protection against  diptheria and whooping cough

## 2015-07-25 NOTE — Telephone Encounter (Signed)
-----   Message from Eustace Pen sent at 07/25/2015  2:44 PM EDT ----- Regarding: added order Dr. Elige Radon at [facility] needs an order for right breast u/s before she schedules the patient. Thanks, [employer]

## 2015-07-27 NOTE — Assessment & Plan Note (Addendum)
Home BPs have been at or below goal. No changes today.  Lab Results  Component Value Date   CREATININE 1.16 07/24/2015   Lab Results  Component Value Date   NA 138 07/24/2015   K 4.9 07/24/2015   CL 102 07/24/2015   CO2 23 07/24/2015

## 2015-07-27 NOTE — Assessment & Plan Note (Addendum)
Secondary to medication and dietary noncompliance.  Advised to resume insulin with instructions to increased NPH in the evening gradually until fasting CBG is < 130.  Lab Results  Component Value Date   HGBA1C 8.8* 07/24/2015   Lab Results  Component Value Date   MICROALBUR 3.0* 12/24/2014

## 2015-07-27 NOTE — Assessment & Plan Note (Signed)
She did not have 6 month follow up in April, mammogram ordered today

## 2015-07-29 ENCOUNTER — Other Ambulatory Visit: Payer: Self-pay | Admitting: Internal Medicine

## 2015-07-29 DIAGNOSIS — R928 Other abnormal and inconclusive findings on diagnostic imaging of breast: Secondary | ICD-10-CM

## 2015-07-30 ENCOUNTER — Other Ambulatory Visit: Payer: Self-pay | Admitting: Internal Medicine

## 2015-07-30 DIAGNOSIS — R928 Other abnormal and inconclusive findings on diagnostic imaging of breast: Secondary | ICD-10-CM

## 2015-08-13 ENCOUNTER — Other Ambulatory Visit: Payer: Medicare Other

## 2015-08-13 ENCOUNTER — Ambulatory Visit: Payer: Medicare Other

## 2015-08-22 ENCOUNTER — Other Ambulatory Visit: Payer: Self-pay | Admitting: Internal Medicine

## 2015-08-26 ENCOUNTER — Other Ambulatory Visit: Payer: Self-pay | Admitting: Internal Medicine

## 2015-08-26 ENCOUNTER — Ambulatory Visit
Admission: RE | Admit: 2015-08-26 | Discharge: 2015-08-26 | Disposition: A | Discharge status: Home / Self Care | Payer: Medicare Other | Source: Ambulatory Visit | Attending: Internal Medicine | Admitting: Internal Medicine

## 2015-08-26 DIAGNOSIS — N63 Unspecified lump in breast: Secondary | ICD-10-CM | POA: Insufficient documentation

## 2015-08-26 DIAGNOSIS — R928 Other abnormal and inconclusive findings on diagnostic imaging of breast: Secondary | ICD-10-CM

## 2015-08-30 ENCOUNTER — Encounter: Payer: Self-pay | Admitting: Internal Medicine

## 2015-08-30 ENCOUNTER — Ambulatory Visit (INDEPENDENT_AMBULATORY_CARE_PROVIDER_SITE_OTHER): Payer: Medicare Other | Admitting: Internal Medicine

## 2015-08-30 VITALS — BP 170/88 | HR 94 | Temp 98.7°F | Resp 14 | Ht 63.0 in | Wt 139.0 lb

## 2015-08-30 DIAGNOSIS — E1165 Type 2 diabetes mellitus with hyperglycemia: Secondary | ICD-10-CM | POA: Diagnosis not present

## 2015-08-30 DIAGNOSIS — Z23 Encounter for immunization: Secondary | ICD-10-CM | POA: Diagnosis not present

## 2015-08-30 DIAGNOSIS — E1129 Type 2 diabetes mellitus with other diabetic kidney complication: Secondary | ICD-10-CM

## 2015-08-30 DIAGNOSIS — IMO0002 Reserved for concepts with insufficient information to code with codable children: Secondary | ICD-10-CM

## 2015-08-30 MED ORDER — GLIPIZIDE 5 MG PO TABS: ORAL_TABLET | ORAL | Status: DC

## 2015-08-30 MED ORDER — SITAGLIPTIN PHOSPHATE 50 MG PO TABS: 50.0000 mg | ORAL_TABLET | Freq: Every day | ORAL | Status: DC

## 2015-08-30 NOTE — Patient Instructions (Addendum)
Your blood sugars are too low in the late afternoon ,  And too high in the evening after your meal   I am reducing the morning glipizide dose to 5 mg and continuing 10 mg in the evening  I am adding a 3rd diabetes medication , Januvia to your regimen.  It is taken once daily in the morning  Continue taking the insulin at night and in the morning

## 2015-08-30 NOTE — Progress Notes (Signed)
Subjective:  Patient ID: Julie Dominguez, female    DOB: 11-20-1951  Age: 64 y.o. MRN: [id number]  CC: The encounter diagnosis was Need for prophylactic vaccination against Streptococcus pneumoniae (pneumococcus).  HPI [facility] presents for follow up on uncontrolled DM.  Was asked to return in one month with blood sugar log   Recurrent lows at 4 pm,  Lunch is often skipped or just a sandwhich or two hotdugs on buns  Has been to diabetes seminars att [place].   Evening meal post prandial 160 to 180    Outpatient Prescriptions Prior to Visit  Medication Sig Dispense Refill  . amLODipine (NORVASC) 10 MG tablet TAKE 1 TABLET (10 MG TOTAL) BY MOUTH DAILY. 90 tablet 1  . cloNIDine (CATAPRES) 0.2 MG tablet Take 1 tablet (0.2 mg total) by mouth 2 (two) times daily. 180 tablet 1  . hydrochlorothiazide (HYDRODIURIL) 25 MG tablet Take 1 tablet (25 mg total) by mouth daily. 90 tablet 3  . INS SYRINGE/NEEDLE 1CC/28G (B-D INSULIN SYRINGE 1CC/28G) 28G X 1/2" 1 ML MISC 1 Syringe by Does not apply route 2 (two) times daily. 100 each 11  . insulin NPH Human (NOVOLIN N) 100 UNIT/ML injection 25 units at bedtime,  17 units at breakfast 20 mL 11  . lisinopril (PRINIVIL,ZESTRIL) 40 MG tablet TAKE 1 TABLET (40 MG TOTAL) BY MOUTH DAILY.... DOSE CHANGE 90 tablet 4  . losartan (COZAAR) 100 MG tablet TAKE 1 TABLET BY MOUTH EVERY DAY 90 tablet 1  . metFORMIN (GLUCOPHAGE) 850 MG tablet Take 1 tablet (850 mg total) by mouth 2 (two) times daily with a meal. 180 tablet 3  . ONE TOUCH ULTRA TEST test strip TEST SUGAR TWICE A DAY 100 each 3  . simvastatin (ZOCOR) 40 MG tablet Take 1 tablet (40 mg total) by mouth every evening. 90 tablet 2  . glipiZIDE (GLUCOTROL) 10 MG tablet TAKE 1 TABLET BY MOUTH TWICE A DAY BEFORE MEALS 180 tablet 1   No facility-administered medications prior to visit.    Review of Systems;  Patient denies headache, fevers, malaise, unintentional weight loss, skin rash, eye  pain, sinus congestion and sinus pain, sore throat, dysphagia,  hemoptysis , cough, dyspnea, wheezing, chest pain, palpitations, orthopnea, edema, abdominal pain, nausea, melena, diarrhea, constipation, flank pain, dysuria, hematuria, urinary  Frequency, nocturia, numbness, tingling, seizures,  Focal weakness, Loss of consciousness,  Tremor, insomnia, depression, anxiety, and suicidal ideation.      Objective:  BP 170/88 mmHg  Pulse 94  Temp(Src) 98.7 F (37.1 C) (Oral)  Resp 14  Ht 5\' 3"  (1.6 m)  Wt 139 lb (63.05 kg)  BMI 24.63 kg/m2  SpO2 98%  BP Readings from Last 3 Encounters:  08/30/15 170/88  07/25/15 162/86  04/26/15 162/60    Wt Readings from Last 3 Encounters:  08/30/15 139 lb (63.05 kg)  07/25/15 135 lb 8 oz (61.462 kg)  04/26/15 141 lb (63.957 kg)    General appearance: alert, cooperative and appears stated age Ears: normal TM's and external ear canals both ears Throat: lips, mucosa, and tongue normal; teeth and gums normal Neck: no adenopathy, no carotid bruit, supple, symmetrical, trachea midline and thyroid not enlarged, symmetric, no tenderness/mass/nodules Back: symmetric, no curvature. ROM normal. No CVA tenderness. Lungs: clear to auscultation bilaterally Heart: regular rate and rhythm, S1, S2 normal, no murmur, click, rub or gallop Abdomen: soft, non-tender; bowel sounds normal; no masses,  no organomegaly Pulses: 2+ and symmetric Skin: Skin color, texture, turgor normal.  No rashes or lesions Lymph nodes: Cervical, supraclavicular, and axillary nodes normal.  Lab Results  Component Value Date   HGBA1C 8.8* 07/24/2015   HGBA1C 8.3* 03/27/2015   HGBA1C 7.9* 12/24/2014    Lab Results  Component Value Date   CREATININE 1.16 07/24/2015   CREATININE 1.03 03/27/2015   CREATININE 0.88 01/09/2015    Lab Results  Component Value Date   WBC 6.1 04/22/2012   HGB 13.2 04/22/2012   HCT 41.7 04/22/2012   PLT 191 04/22/2012   GLUCOSE 149* 07/24/2015    CHOL 173 07/24/2015   TRIG 114.0 07/24/2015   HDL 40.10 07/24/2015   LDLDIRECT 101.0 07/24/2015   LDLCALC 110* 07/24/2015   ALT 15 07/24/2015   AST 16 07/24/2015   NA 138 07/24/2015   K 4.9 07/24/2015   CL 102 07/24/2015   CREATININE 1.16 07/24/2015   BUN 34* 07/24/2015   CO2 23 07/24/2015   HGBA1C 8.8* 07/24/2015   MICROALBUR 3.0* 12/24/2014    Mm Diag Breast Tomo Uni Left  08/26/2015   CLINICAL DATA:  Six month re-evaluation probably benign left breast nodule.  EXAM: DIGITAL DIAGNOSTIC LEFT MAMMOGRAM WITH 3D TOMOSYNTHESIS AND CAD  COMPARISON:  09/28/2014, 10/08/2014.  ACR Breast Density Category c: The breast tissue is heterogeneously dense, which may obscure small masses.  FINDINGS: The left breast parenchymal pattern is stable. There is a small, oval, circumscribed nodule located within the anterior portion of the upper-outer quadrant of the left breast. On the 3D views there is central lucency most consistent with a small benign intramammary lymph node. There are no additional findings.  Mammographic images were processed with CAD.  IMPRESSION: Stable nodule located within the upper outer quadrant left breast most consistent with a benign intramammary lymph node. Recommend bilateral diagnostic mammography in 6 months.  RECOMMENDATION: Bilateral diagnostic mammography in 6 months.  I have discussed the findings and recommendations with the patient. Results were also provided in writing at the conclusion of the visit. If applicable, a reminder letter will be sent to the patient regarding the next appointment.  BI-RADS CATEGORY  3: Probably benign.   Electronically Signed   By: Altamese Cabal M.D.   On: 08/26/2015 10:17    Assessment & Plan:   Problem List Items Addressed This Visit    None    Visit Diagnoses    Need for prophylactic vaccination against Streptococcus pneumoniae (pneumococcus)    -  Primary    Relevant Orders    Pneumococcal polysaccharide vaccine 23-valent greater  than or equal to 2yo subcutaneous/IM (Completed)       I have changed Ms. Coulthard's glipiZIDE. I am also having her start on sitaGLIPtin. Additionally, I am having her maintain her ONE TOUCH ULTRA TEST, INS SYRINGE/NEEDLE 1CC/28G, amLODipine, hydrochlorothiazide, metFORMIN, simvastatin, insulin NPH Human, losartan, cloNIDine, and lisinopril.  Meds ordered this encounter  Medications  . sitaGLIPtin (JANUVIA) 50 MG tablet    Sig: Take 1 tablet (50 mg total) by mouth daily.    Dispense:  30 tablet    Refill:  5  . glipiZIDE (GLUCOTROL) 5 MG tablet    Sig: 1 tablet in am and 2 tablets in PM before meals    Dispense:  90 tablet    Refill:  5    Medications Discontinued During This Encounter  Medication Reason  . glipiZIDE (GLUCOTROL) 10 MG tablet Reorder    Follow-up: No Follow-up on file.   Crecencio Mc, MD

## 2015-08-30 NOTE — Assessment & Plan Note (Signed)
improveded but not at goal,  Frequent mid afternoon lows,  Will decrease her morning dose of glipizide to 5 mg.  Add januvia 50 mg .  Continue NPH bid . Retrun in 3 months

## 2015-08-30 NOTE — Progress Notes (Signed)
Pre-visit discussion using our clinic review tool. No additional management support is needed unless otherwise documented below in the visit note.  

## 2015-09-23 ENCOUNTER — Other Ambulatory Visit: Payer: Self-pay

## 2015-09-23 MED ORDER — GLIPIZIDE 5 MG PO TABS: ORAL_TABLET | ORAL | Status: DC

## 2015-09-23 MED ORDER — METFORMIN HCL 850 MG PO TABS: 850.0000 mg | ORAL_TABLET | Freq: Two times a day (BID) | ORAL | Status: DC

## 2015-09-23 MED ORDER — CLONIDINE HCL 0.2 MG PO TABS: 0.2000 mg | ORAL_TABLET | Freq: Two times a day (BID) | ORAL | Status: DC

## 2015-09-23 MED ORDER — AMLODIPINE BESYLATE 10 MG PO TABS: ORAL_TABLET | ORAL | Status: DC

## 2015-09-23 MED ORDER — HYDROCHLOROTHIAZIDE 25 MG PO TABS: 25.0000 mg | ORAL_TABLET | Freq: Every day | ORAL | Status: DC

## 2015-09-26 ENCOUNTER — Other Ambulatory Visit: Payer: Self-pay | Admitting: Internal Medicine

## 2015-10-25 ENCOUNTER — Ambulatory Visit: Payer: Medicare Other | Admitting: Internal Medicine

## 2015-10-28 ENCOUNTER — Ambulatory Visit: Payer: Medicare Other | Admitting: Internal Medicine

## 2015-10-28 DIAGNOSIS — Z0289 Encounter for other administrative examinations: Secondary | ICD-10-CM

## 2015-11-20 ENCOUNTER — Other Ambulatory Visit: Payer: Self-pay | Admitting: Internal Medicine

## 2015-11-29 ENCOUNTER — Ambulatory Visit (INDEPENDENT_AMBULATORY_CARE_PROVIDER_SITE_OTHER): Payer: Medicare Other | Admitting: Internal Medicine

## 2015-11-29 ENCOUNTER — Encounter: Payer: Self-pay | Admitting: Internal Medicine

## 2015-11-29 VITALS — BP 172/82 | HR 69 | Temp 97.5°F | Resp 12

## 2015-11-29 DIAGNOSIS — Z794 Long term (current) use of insulin: Secondary | ICD-10-CM | POA: Diagnosis not present

## 2015-11-29 DIAGNOSIS — R030 Elevated blood-pressure reading, without diagnosis of hypertension: Secondary | ICD-10-CM | POA: Diagnosis not present

## 2015-11-29 DIAGNOSIS — IMO0002 Reserved for concepts with insufficient information to code with codable children: Secondary | ICD-10-CM

## 2015-11-29 DIAGNOSIS — E785 Hyperlipidemia, unspecified: Secondary | ICD-10-CM | POA: Diagnosis not present

## 2015-11-29 DIAGNOSIS — E1121 Type 2 diabetes mellitus with diabetic nephropathy: Secondary | ICD-10-CM

## 2015-11-29 DIAGNOSIS — E1165 Type 2 diabetes mellitus with hyperglycemia: Secondary | ICD-10-CM | POA: Diagnosis not present

## 2015-11-29 DIAGNOSIS — IMO0001 Reserved for inherently not codable concepts without codable children: Secondary | ICD-10-CM

## 2015-11-29 LAB — COMPREHENSIVE METABOLIC PANEL
ALT: 13 U/L (ref 0–35)
AST: 11 U/L (ref 0–37)
Albumin: 4.4 g/dL (ref 3.5–5.2)
Alkaline Phosphatase: 124 U/L — ABNORMAL HIGH (ref 39–117)
BILIRUBIN TOTAL: 0.5 mg/dL (ref 0.2–1.2)
BUN: 36 mg/dL — ABNORMAL HIGH (ref 6–23)
CO2: 27 meq/L (ref 19–32)
Calcium: 9.2 mg/dL (ref 8.4–10.5)
Chloride: 100 mEq/L (ref 96–112)
Creatinine, Ser: 1.25 mg/dL — ABNORMAL HIGH (ref 0.40–1.20)
GFR: 55.07 mL/min — AB (ref >60.00)
GLUCOSE: 277 mg/dL — AB (ref 70–99)
Potassium: 3.9 mEq/L (ref 3.5–5.1)
SODIUM: 137 meq/L (ref 135–145)
Total Protein: 8 g/dL (ref 6.0–8.3)

## 2015-11-29 LAB — MICROALBUMIN / CREATININE URINE RATIO
CREATININE, U: 83.2 mg/dL
MICROALB UR: 3 mg/dL — AB (ref 0.0–1.9)
Microalb Creat Ratio: 3.6 mg/g (ref 0.0–30.0)

## 2015-11-29 LAB — LDL CHOLESTEROL, DIRECT: Direct LDL: 118 mg/dL

## 2015-11-29 LAB — HEMOGLOBIN A1C: Hgb A1c MFr Bld: 9.5 % — ABNORMAL HIGH (ref 4.6–6.5)

## 2015-11-29 MED ORDER — INSULIN NPH (HUMAN) (ISOPHANE) 100 UNIT/ML ~~LOC~~ SUSP: SUBCUTANEOUS | Status: DC

## 2015-11-29 MED ORDER — METOPROLOL TARTRATE 50 MG PO TABS: 50.0000 mg | ORAL_TABLET | Freq: Two times a day (BID) | ORAL | Status: DC

## 2015-11-29 NOTE — Progress Notes (Signed)
Pre visit review using our clinic review tool, if applicable. No additional management support is needed unless otherwise documented below in the visit note. 

## 2015-11-29 NOTE — Patient Instructions (Addendum)
I am making a change to BP medications  I am stopping clonidine and starting metoprolol.  Take it twice daily  (if affordable)   For your Diabetes:  Please reume insulin twice daily   In the evening just 10 units at night  Continue 5 units in the morning of insulin  Please Increase the glipizide  To 10 mg two times daily,  Before breakfast and dinner

## 2015-11-29 NOTE — Progress Notes (Signed)
Subjective:  Patient ID: Julie Dominguez, female    DOB: 12-20-1950  Age: 64 y.o. MRN: [id number]  CC: The primary encounter diagnosis was Uncontrolled type 2 diabetes mellitus with diabetic nephropathy, with long-term current use of insulin ([place]). Diagnoses of Hyperlipidemia and White coat hypertension were also pertinent to this visit.  HPI [facility] presents for follow up on type 2 DM and hypertension  Home blood pressures have been averaging  160/80   DM:  She has  been inconsistent withuse of prescribed  insulin due to cost.  It is unclear what does she has been taking because her answers are vague and change repeatedly.  She is taking glipizide as directed, ; at her last visit dose was reduced to  5 mg in am and 10 mg before dinner.   Has not been taking [place] , never got it filled because pharmacist said it was too #$$$ Has been reducing NPH  To mostly morning doses due to recurrent  low blood sugars in the AM to 56 -70.  Has been using 17 units in the AM and  Unclear how much in the evening.   Diabetic neuropathy:  Some mild burning sensation but  reports sensation intact.  Has not has eye exam due to outstanding debt to [place] for the last surgery of cataracts   Outpatient Prescriptions Prior to Visit  Medication Sig Dispense Refill  . amLODipine (NORVASC) 10 MG tablet TAKE 1 TABLET (10 MG TOTAL) BY MOUTH DAILY. 90 tablet 2  . glipiZIDE (GLUCOTROL) 10 MG tablet TAKE 1 TABLET BY MOUTH TWICE A DAY BEFORE MEALS 180 tablet 0  . hydrochlorothiazide (HYDRODIURIL) 25 MG tablet Take 1 tablet (25 mg total) by mouth daily. 90 tablet 3  . INS SYRINGE/NEEDLE 1CC/28G (B-D INSULIN SYRINGE 1CC/28G) 28G X 1/2" 1 ML MISC 1 Syringe by Does not apply route 2 (two) times daily. 100 each 11  . lisinopril (PRINIVIL,ZESTRIL) 40 MG tablet TAKE 1 TABLET (40 MG TOTAL) BY MOUTH DAILY.... DOSE CHANGE 90 tablet 4  . losartan (COZAAR) 100 MG tablet TAKE 1 TABLET BY MOUTH EVERY  DAY 90 tablet 1  . metFORMIN (GLUCOPHAGE) 850 MG tablet TAKE 1 TABLET BY MOUTH TWICE A DAY WITH MEAL 180 tablet 3  . ONE TOUCH ULTRA TEST test strip TEST SUGAR TWICE A DAY 100 each 3  . simvastatin (ZOCOR) 40 MG tablet Take 1 tablet (40 mg total) by mouth every evening. 90 tablet 2  . sitaGLIPtin (JANUVIA) 50 MG tablet Take 1 tablet (50 mg total) by mouth daily. 30 tablet 5  . cloNIDine (CATAPRES) 0.2 MG tablet Take 1 tablet (0.2 mg total) by mouth 2 (two) times daily. 180 tablet 1  . glipiZIDE (GLUCOTROL) 5 MG tablet 1 tablet in am and 2 tablets in PM before meals 90 tablet 5  . insulin NPH Human (NOVOLIN N) 100 UNIT/ML injection 25 units at bedtime,  17 units at breakfast 20 mL 11   No facility-administered medications prior to visit.    Review of Systems;  Patient denies headache, fevers, malaise, unintentional weight loss, skin rash, eye pain, sinus congestion and sinus pain, sore throat, dysphagia,  hemoptysis , cough, dyspnea, wheezing, chest pain, palpitations, orthopnea, edema, abdominal pain, nausea, melena, diarrhea, constipation, flank pain, dysuria, hematuria, urinary  Frequency, nocturia, numbness, tingling, seizures,  Focal weakness, Loss of consciousness,  Tremor, insomnia, depression, anxiety, and suicidal ideation.      Objective:  BP 172/82 mmHg  Pulse 69  Temp(Src) 97.5 F (36.4 C) (Oral)  Resp 12  SpO2 98%  BP Readings from Last 3 Encounters:  11/29/15 172/82  08/30/15 170/88  07/25/15 162/86    Wt Readings from Last 3 Encounters:  08/30/15 139 lb (63.05 kg)  07/25/15 135 lb 8 oz (61.462 kg)  04/26/15 141 lb (63.957 kg)    General appearance: alert, cooperative and appears stated age Ears: normal TM's and external ear canals both ears Throat: lips, mucosa, and tongue normal; teeth and gums normal Neck: no adenopathy, no carotid bruit, supple, symmetrical, trachea midline and thyroid not enlarged, symmetric, no tenderness/mass/nodules Back: symmetric, no  curvature. ROM normal. No CVA tenderness. Lungs: clear to auscultation bilaterally Heart: regular rate and rhythm, S1, S2 normal, no murmur, click, rub or gallop Abdomen: soft, non-tender; bowel sounds normal; no masses,  no organomegaly Pulses: 2+ and symmetric Skin: Skin color, texture, turgor normal. No rashes or lesions Lymph nodes: Cervical, supraclavicular, and axillary nodes normal.  Lab Results  Component Value Date   HGBA1C 9.5* 11/29/2015   HGBA1C 8.8* 07/24/2015   HGBA1C 8.3* 03/27/2015    Lab Results  Component Value Date   CREATININE 1.25* 11/29/2015   CREATININE 1.16 07/24/2015   CREATININE 1.03 03/27/2015    Lab Results  Component Value Date   WBC 6.1 04/22/2012   HGB 13.2 04/22/2012   HCT 41.7 04/22/2012   PLT 191 04/22/2012   GLUCOSE 277* 11/29/2015   CHOL 173 07/24/2015   TRIG 114.0 07/24/2015   HDL 40.10 07/24/2015   LDLDIRECT 118.0 11/29/2015   LDLCALC 110* 07/24/2015   ALT 13 11/29/2015   AST 11 11/29/2015   NA 137 11/29/2015   K 3.9 11/29/2015   CL 100 11/29/2015   CREATININE 1.25* 11/29/2015   BUN 36* 11/29/2015   CO2 27 11/29/2015   HGBA1C 9.5* 11/29/2015   MICROALBUR 3.0* 11/29/2015    Mm Diag Breast Tomo Uni Left  08/26/2015  CLINICAL DATA:  Six month re-evaluation probably benign left breast nodule. EXAM: DIGITAL DIAGNOSTIC LEFT MAMMOGRAM WITH 3D TOMOSYNTHESIS AND CAD COMPARISON:  09/28/2014, 10/08/2014. ACR Breast Density Category c: The breast tissue is heterogeneously dense, which may obscure small masses. FINDINGS: The left breast parenchymal pattern is stable. There is a small, oval, circumscribed nodule located within the anterior portion of the upper-outer quadrant of the left breast. On the 3D views there is central lucency most consistent with a small benign intramammary lymph node. There are no additional findings. Mammographic images were processed with CAD. IMPRESSION: Stable nodule located within the upper outer quadrant left  breast most consistent with a benign intramammary lymph node. Recommend bilateral diagnostic mammography in 6 months. RECOMMENDATION: Bilateral diagnostic mammography in 6 months. I have discussed the findings and recommendations with the patient. Results were also provided in writing at the conclusion of the visit. If applicable, a reminder letter will be sent to the patient regarding the next appointment. BI-RADS CATEGORY  3: Probably benign. Electronically Signed   By: Altamese Cabal M.D.   On: 08/26/2015 10:17    Assessment & Plan:   Problem List Items Addressed This Visit    White coat hypertension    Home readings have been elevated as well on 4 drug regimen. Will change clonidine to metoprolol.       Relevant Medications   metoprolol (LOPRESSOR) 50 MG tablet   Hyperlipidemia    Well controlled on current statin therapy.   Liver enzymes are normal , no changes today.  Lab Results  Component Value Date   CHOL 173 07/24/2015   HDL 40.10 07/24/2015   LDLCALC 110* 07/24/2015   LDLDIRECT 118.0 11/29/2015   TRIG 114.0 07/24/2015   CHOLHDL 4 07/24/2015   Lab Results  Component Value Date   ALT 13 11/29/2015   AST 11 11/29/2015   ALKPHOS 124* 11/29/2015   BILITOT 0.5 11/29/2015             Relevant Medications   metoprolol (LOPRESSOR) 50 MG tablet   Other Relevant Orders   LDL cholesterol, direct (Completed)   Uncontrolled type 2 diabetes with renal manifestation ([place]) - Primary    Uncontrolled due to inadequate insulin use, and failure to start Januvia. Will increase glipizide to 10 mg bid.and have patient return with blood sugar readings in 2 weeks  Lab Results  Component Value Date   HGBA1C 9.5* 11/29/2015   Lab Results  Component Value Date   MICROALBUR 3.0* 11/29/2015         Relevant Medications   insulin NPH Human (NOVOLIN N) 100 UNIT/ML injection   Other Relevant Orders   Comprehensive metabolic panel (Completed)   Hemoglobin A1c (Completed)    Microalbumin / creatinine urine ratio (Completed)      I have discontinued Ms. Humphries's cloNIDine. I have also changed her insulin NPH Human. Additionally, I am having her start on metoprolol. Lastly, I am having her maintain her ONE TOUCH ULTRA TEST, INS SYRINGE/NEEDLE 1CC/28G, simvastatin, lisinopril, sitaGLIPtin, amLODipine, hydrochlorothiazide, glipiZIDE, losartan, and metFORMIN.  Meds ordered this encounter  Medications  . metoprolol (LOPRESSOR) 50 MG tablet    Sig: Take 1 tablet (50 mg total) by mouth 2 (two) times daily.    Dispense:  60 tablet    Refill:  3  . insulin NPH Human (NOVOLIN N) 100 UNIT/ML injection    Sig: 10 units at bedtime,  5  units at breakfast    Dispense:  20 mL    Refill:  11    250.02 (uncontrolled diabetes )   A total of 25 minutes of face to face time was spent with patient more than half of which was spent in counselling about the above mentioned conditions  and coordination of care   Medications Discontinued During This Encounter  Medication Reason  . cloNIDine (CATAPRES) 0.2 MG tablet   . glipiZIDE (GLUCOTROL) 5 MG tablet   . insulin NPH Human (NOVOLIN N) 100 UNIT/ML injection Reorder    Follow-up: Return in about 4 weeks (around 12/27/2015) for follow up diabetes.   Crecencio Mc, MD

## 2015-12-01 ENCOUNTER — Encounter: Payer: Self-pay | Admitting: Internal Medicine

## 2015-12-01 NOTE — Assessment & Plan Note (Signed)
Well controlled on current statin therapy.   Liver enzymes are normal , no changes today.  Lab Results  Component Value Date   CHOL 173 07/24/2015   HDL 40.10 07/24/2015   LDLCALC 110* 07/24/2015   LDLDIRECT 118.0 11/29/2015   TRIG 114.0 07/24/2015   CHOLHDL 4 07/24/2015   Lab Results  Component Value Date   ALT 13 11/29/2015   AST 11 11/29/2015   ALKPHOS 124* 11/29/2015   BILITOT 0.5 11/29/2015

## 2015-12-01 NOTE — Assessment & Plan Note (Signed)
Uncontrolled due to inadequate insulin use, and failure to start Januvia. Will increase glipizide to 10 mg bid.and have patient return with blood sugar readings in 2 weeks  Lab Results  Component Value Date   HGBA1C 9.5* 11/29/2015   Lab Results  Component Value Date   MICROALBUR 3.0* 11/29/2015

## 2015-12-01 NOTE — Assessment & Plan Note (Signed)
Home readings have been elevated as well on 4 drug regimen. Will change clonidine to metoprolol.

## 2015-12-31 ENCOUNTER — Ambulatory Visit (INDEPENDENT_AMBULATORY_CARE_PROVIDER_SITE_OTHER): Payer: Medicare HMO | Admitting: Internal Medicine

## 2015-12-31 ENCOUNTER — Encounter: Payer: Self-pay | Admitting: Internal Medicine

## 2015-12-31 VITALS — BP 180/84 | HR 73 | Temp 98.4°F | Resp 12 | Ht 63.0 in | Wt 134.2 lb

## 2015-12-31 DIAGNOSIS — Z794 Long term (current) use of insulin: Secondary | ICD-10-CM

## 2015-12-31 DIAGNOSIS — E1165 Type 2 diabetes mellitus with hyperglycemia: Secondary | ICD-10-CM

## 2015-12-31 DIAGNOSIS — R030 Elevated blood-pressure reading, without diagnosis of hypertension: Secondary | ICD-10-CM | POA: Diagnosis not present

## 2015-12-31 DIAGNOSIS — IMO0001 Reserved for inherently not codable concepts without codable children: Secondary | ICD-10-CM

## 2015-12-31 DIAGNOSIS — IMO0002 Reserved for concepts with insufficient information to code with codable children: Secondary | ICD-10-CM

## 2015-12-31 DIAGNOSIS — E1121 Type 2 diabetes mellitus with diabetic nephropathy: Secondary | ICD-10-CM | POA: Diagnosis not present

## 2015-12-31 MED ORDER — INSULIN NPH (HUMAN) (ISOPHANE) 100 UNIT/ML ~~LOC~~ SUSP: SUBCUTANEOUS | Status: DC

## 2015-12-31 MED ORDER — SITAGLIPTIN PHOSPHATE 50 MG PO TABS: 50.0000 mg | ORAL_TABLET | Freq: Every day | ORAL | Status: DC

## 2015-12-31 NOTE — Patient Instructions (Signed)
I have paid for you insulin so please go pick it up today  Increase your dose to 25 units in the morning with breakfast,  And 15 units in the evening after dinner   Bring your blood pressure machine with you in 2 weeks to your next appointment.   AND CHECK YOUR SUGARS ONCE A DAY AT VARIOUS TIMES: FASTING ,  BEFORE DINNER,  BEDTIME AND BRING YOUR LOG WITH YOU

## 2015-12-31 NOTE — Progress Notes (Signed)
Pre-visit discussion using our clinic review tool. No additional management support is needed unless otherwise documented below in the visit note.  

## 2015-12-31 NOTE — Progress Notes (Signed)
Subjective:  Patient ID: Julie Dominguez, female    DOB: July 09, 1951  Age: 65 y.o. MRN: [id number]  CC: The primary encounter diagnosis was Uncontrolled type 2 diabetes mellitus with diabetic nephropathy, with long-term current use of insulin ([place]). A diagnosis of White coat hypertension was also pertinent to this visit.  HPI Julie Dominguez presents for one month  follow up on DM uncontrolled and HTN Lab Results  Component Value Date   HGBA1C 9.5* 11/29/2015   Her morning blood sugars are usually 180  Range bc she has been rationing her insulin. .   She cannot afford the copay on her insulin, which is $30/month, while her other medications have a much lower copay.    Her bp at home has been considerably lower, and sh eis taking her 5 bp medications as directed. ,    Outpatient Prescriptions Prior to Visit  Medication Sig Dispense Refill  . amLODipine (NORVASC) 10 MG tablet TAKE 1 TABLET (10 MG TOTAL) BY MOUTH DAILY. 90 tablet 2  . glipiZIDE (GLUCOTROL) 10 MG tablet TAKE 1 TABLET BY MOUTH TWICE A DAY BEFORE MEALS 180 tablet 0  . hydrochlorothiazide (HYDRODIURIL) 25 MG tablet Take 1 tablet (25 mg total) by mouth daily. 90 tablet 3  . INS SYRINGE/NEEDLE 1CC/28G (B-D INSULIN SYRINGE 1CC/28G) 28G X 1/2" 1 ML MISC 1 Syringe by Does not apply route 2 (two) times daily. 100 each 11  . lisinopril (PRINIVIL,ZESTRIL) 40 MG tablet TAKE 1 TABLET (40 MG TOTAL) BY MOUTH DAILY.... DOSE CHANGE 90 tablet 4  . losartan (COZAAR) 100 MG tablet TAKE 1 TABLET BY MOUTH EVERY DAY 90 tablet 1  . metFORMIN (GLUCOPHAGE) 850 MG tablet TAKE 1 TABLET BY MOUTH TWICE A DAY WITH MEAL 180 tablet 3  . metoprolol (LOPRESSOR) 50 MG tablet Take 1 tablet (50 mg total) by mouth 2 (two) times daily. 60 tablet 3  . ONE TOUCH ULTRA TEST test strip TEST SUGAR TWICE A DAY 100 each 3  . simvastatin (ZOCOR) 40 MG tablet Take 1 tablet (40 mg total) by mouth every evening. 90 tablet 2  . insulin NPH Human (NOVOLIN N) 100  UNIT/ML injection 10 units at bedtime,  5  units at breakfast 20 mL 11  . sitaGLIPtin (JANUVIA) 50 MG tablet Take 1 tablet (50 mg total) by mouth daily. 30 tablet 5   No facility-administered medications prior to visit.    Review of Systems;  Patient denies headache, fevers, malaise, unintentional weight loss, skin rash, eye pain, sinus congestion and sinus pain, sore throat, dysphagia,  hemoptysis , cough, dyspnea, wheezing, chest pain, palpitations, orthopnea, edema, abdominal pain, nausea, melena, diarrhea, constipation, flank pain, dysuria, hematuria, urinary  Frequency, nocturia, numbness, tingling, seizures,  Focal weakness, Loss of consciousness,  Tremor, insomnia, depression, anxiety, and suicidal ideation.      Objective:  BP 180/84 mmHg  Pulse 73  Temp(Src) 98.4 F (36.9 C) (Oral)  Resp 12  Ht 5\' 3"  (1.6 m)  Wt 134 lb 4 oz (60.895 kg)  BMI 23.79 kg/m2  SpO2 99%  BP Readings from Last 3 Encounters:  12/31/15 180/84  11/29/15 172/82  08/30/15 170/88    Wt Readings from Last 3 Encounters:  12/31/15 134 lb 4 oz (60.895 kg)  08/30/15 139 lb (63.05 kg)  07/25/15 135 lb 8 oz (61.462 kg)    General appearance: alert, cooperative and appears stated age Ears: normal TM's and external ear canals both ears Throat: lips, mucosa, and tongue normal; teeth and  gums normal Neck: no adenopathy, no carotid bruit, supple, symmetrical, trachea midline and thyroid not enlarged, symmetric, no tenderness/mass/nodules Back: symmetric, no curvature. ROM normal. No CVA tenderness. Lungs: clear to auscultation bilaterally Heart: regular rate and rhythm, S1, S2 normal, no murmur, click, rub or gallop Abdomen: soft, non-tender; bowel sounds normal; no masses,  no organomegaly Pulses: 2+ and symmetric Skin: Skin color, texture, turgor normal. No rashes or lesions Lymph nodes: Cervical, supraclavicular, and axillary nodes normal.  Lab Results  Component Value Date   HGBA1C 9.5* 11/29/2015     HGBA1C 8.8* 07/24/2015   HGBA1C 8.3* 03/27/2015    Lab Results  Component Value Date   CREATININE 1.25* 11/29/2015   CREATININE 1.16 07/24/2015   CREATININE 1.03 03/27/2015    Lab Results  Component Value Date   WBC 6.1 04/22/2012   HGB 13.2 04/22/2012   HCT 41.7 04/22/2012   PLT 191 04/22/2012   GLUCOSE 277* 11/29/2015   CHOL 173 07/24/2015   TRIG 114.0 07/24/2015   HDL 40.10 07/24/2015   LDLDIRECT 118.0 11/29/2015   LDLCALC 110* 07/24/2015   ALT 13 11/29/2015   AST 11 11/29/2015   NA 137 11/29/2015   K 3.9 11/29/2015   CL 100 11/29/2015   CREATININE 1.25* 11/29/2015   BUN 36* 11/29/2015   CO2 27 11/29/2015   HGBA1C 9.5* 11/29/2015   MICROALBUR 3.0* 11/29/2015    Mm Diag Breast Tomo Uni Left  08/26/2015  CLINICAL DATA:  Six month re-evaluation probably benign left breast nodule. EXAM: DIGITAL DIAGNOSTIC LEFT MAMMOGRAM WITH 3D TOMOSYNTHESIS AND CAD COMPARISON:  09/28/2014, 10/08/2014. ACR Breast Density Category c: The breast tissue is heterogeneously dense, which may obscure small masses. FINDINGS: The left breast parenchymal pattern is stable. There is a small, oval, circumscribed nodule located within the anterior portion of the upper-outer quadrant of the left breast. On the 3D views there is central lucency most consistent with a small benign intramammary lymph node. There are no additional findings. Mammographic images were processed with CAD. IMPRESSION: Stable nodule located within the upper outer quadrant left breast most consistent with a benign intramammary lymph node. Recommend bilateral diagnostic mammography in 6 months. RECOMMENDATION: Bilateral diagnostic mammography in 6 months. I have discussed the findings and recommendations with the patient. Results were also provided in writing at the conclusion of the visit. If applicable, a reminder letter will be sent to the patient regarding the next appointment. BI-RADS CATEGORY  3: Probably benign. Electronically  Signed   By: Altamese Cabal M.D.   On: 08/26/2015 10:17    Assessment & Plan:   Problem List Items Addressed This Visit    White coat hypertension    Uncontrolled on 5 medications vs white coat hypertension vs medication nonadherence, She has been asked repeatedly to bring her home BP monitor to her visit for calibration but has not done so.  Will order a 24 hour telemetry monitor.       Relevant Orders   Ambulatory referral to Cardiology   Uncontrolled type 2 diabetes with renal manifestation ([place]) - Primary    uncontrolled secondary to medication regimen nonadherence secondary to financial constraints.  I have arranged to pay the copay for her insulin for 90 days to get her diabetes back under control.  NPH bid dose increased to 20 units in the am and 10 units in the PM with room to increase as needed. Return  in one month      Relevant Medications   sitaGLIPtin (JANUVIA) 50  MG tablet   insulin NPH Human (NOVOLIN N) 100 UNIT/ML injection     A total of 25 minutes of face to face time was spent with patient more than half of which was spent in counselling and coordination of care   I have changed Ms. Bress's insulin NPH Human. I am also having her maintain her ONE TOUCH ULTRA TEST, INS SYRINGE/NEEDLE 1CC/28G, simvastatin, lisinopril, amLODipine, hydrochlorothiazide, glipiZIDE, losartan, metFORMIN, metoprolol, and sitaGLIPtin.  Meds ordered this encounter  Medications  . sitaGLIPtin (JANUVIA) 50 MG tablet    Sig: Take 1 tablet (50 mg total) by mouth daily.    Dispense:  90 tablet    Refill:  1  . insulin NPH Human (NOVOLIN N) 100 UNIT/ML injection    Sig: 25 units in the am and 15 units  at bedtime    Dispense:  40 mL    Refill:  3    (E11.29  uncontrolled diabetes )  90  day supply    Medications Discontinued During This Encounter  Medication Reason  . sitaGLIPtin (JANUVIA) 50 MG tablet Reorder  . insulin NPH Human (NOVOLIN N) 100 UNIT/ML injection Reorder     Follow-up: Return in about 2 weeks (around 01/14/2016) for follow up diabetes.   Crecencio Mc, MD

## 2016-01-01 ENCOUNTER — Telehealth: Payer: Self-pay | Admitting: Internal Medicine

## 2016-01-01 NOTE — Assessment & Plan Note (Signed)
uncontrolled secondary to medication regimen nonadherence secondary to financial constraints.  I have arranged to pay the copay for her insulin for 90 days to get her diabetes back under control.  NPH bid dose increased to 20 units in the am and 10 units in the PM with room to increase as needed. Return  in one month

## 2016-01-01 NOTE — Telephone Encounter (Signed)
[employer] regarding Diabetic Testing Supply called to check if the order was received caller stated it was sent on 12/21/2015. Call back number is [phone]. Thank you!

## 2016-01-01 NOTE — Assessment & Plan Note (Signed)
Uncontrolled on 5 medications vs white coat hypertension vs medication nonadherence, She has been asked repeatedly to bring her home BP monitor to her visit for calibration but has not done so.  Will order a 24 hour telemetry monitor.

## 2016-01-02 NOTE — Telephone Encounter (Signed)
Spoke with the patient, she is unsure who supplies her diabetic supplies, she has received numerous calls requesting her to use two different companies.  She will have them call our office once she gets a hold of them.

## 2016-01-07 NOTE — Telephone Encounter (Signed)
[employer] called back May is the rep at  [phone] stated that pt called them on 12/24/2015 wanting them to be her Diabetic Testing Supply. I tried to reach you to speak with [employer]. Thank you!

## 2016-01-07 NOTE — Telephone Encounter (Signed)
I spoke with the patient , she does NOT want her supplies from them, she already has her supplies and doesn't need anymore.  Thanks

## 2016-01-09 DIAGNOSIS — E1165 Type 2 diabetes mellitus with hyperglycemia: Secondary | ICD-10-CM | POA: Diagnosis not present

## 2016-01-16 ENCOUNTER — Ambulatory Visit: Payer: Medicare HMO | Admitting: Internal Medicine

## 2016-01-22 ENCOUNTER — Encounter: Payer: Self-pay | Admitting: Internal Medicine

## 2016-01-22 ENCOUNTER — Ambulatory Visit (INDEPENDENT_AMBULATORY_CARE_PROVIDER_SITE_OTHER): Payer: Medicare HMO | Admitting: Internal Medicine

## 2016-01-22 VITALS — BP 190/80 | HR 85 | Temp 97.4°F | Resp 12 | Ht 63.0 in | Wt 138.0 lb

## 2016-01-22 DIAGNOSIS — R030 Elevated blood-pressure reading, without diagnosis of hypertension: Secondary | ICD-10-CM

## 2016-01-22 DIAGNOSIS — E1129 Type 2 diabetes mellitus with other diabetic kidney complication: Secondary | ICD-10-CM

## 2016-01-22 DIAGNOSIS — IMO0002 Reserved for concepts with insufficient information to code with codable children: Secondary | ICD-10-CM

## 2016-01-22 DIAGNOSIS — IMO0001 Reserved for inherently not codable concepts without codable children: Secondary | ICD-10-CM

## 2016-01-22 DIAGNOSIS — E1165 Type 2 diabetes mellitus with hyperglycemia: Secondary | ICD-10-CM | POA: Diagnosis not present

## 2016-01-22 DIAGNOSIS — R809 Proteinuria, unspecified: Secondary | ICD-10-CM

## 2016-01-22 DIAGNOSIS — Z794 Long term (current) use of insulin: Secondary | ICD-10-CM

## 2016-01-22 NOTE — Patient Instructions (Addendum)
Your insulin dose is too high in the evening  Reduce evening dose to 16 units,  Continue morning  dose of 10 units.    Any sugar between 80 and 120 in the morning before eating is perfect  Any sugar after eating  From 80 to 160 is fine    I will call your pharmacy to make sure your insulin will be ready for you to pick up

## 2016-01-22 NOTE — Progress Notes (Signed)
Subjective:  Patient ID: Julie Dominguez, female    DOB: [birth year]  Age: 65 y.o. MRN: [id number]  CC: The primary encounter diagnosis was Uncontrolled type 2 diabetes mellitus with microalbuminuria, with long-term current use of insulin ([place]). A diagnosis of White coat hypertension was also pertinent to this visit.  HPI [facility] [place] presents for follow up on uncontrolled DM. At her last visit she admitted that she was unable to afford the insulin and was not taking it.  She resumed insulin at last visit 3 weeks ago , using 20 units NPH in the evening and 10 in the am  She has been Having occasional lows in the early morning, otherwise her blood sugars have been at goal mostly.   Outpatient Prescriptions Prior to Visit  Medication Sig Dispense Refill  . amLODipine (NORVASC) 10 MG tablet TAKE 1 TABLET (10 MG TOTAL) BY MOUTH DAILY. 90 tablet 2  . glipiZIDE (GLUCOTROL) 10 MG tablet TAKE 1 TABLET BY MOUTH TWICE A DAY BEFORE MEALS 180 tablet 0  . hydrochlorothiazide (HYDRODIURIL) 25 MG tablet Take 1 tablet (25 mg total) by mouth daily. 90 tablet 3  . INS SYRINGE/NEEDLE 1CC/28G (B-D INSULIN SYRINGE 1CC/28G) 28G X 1/2" 1 ML MISC 1 Syringe by Does not apply route 2 (two) times daily. 100 each 11  . insulin NPH Human (NOVOLIN N) 100 UNIT/ML injection 25 units in the am and 15 units  at bedtime 40 mL 3  . lisinopril (PRINIVIL,ZESTRIL) 40 MG tablet TAKE 1 TABLET (40 MG TOTAL) BY MOUTH DAILY.... DOSE CHANGE 90 tablet 4  . losartan (COZAAR) 100 MG tablet TAKE 1 TABLET BY MOUTH EVERY DAY 90 tablet 1  . metFORMIN (GLUCOPHAGE) 850 MG tablet TAKE 1 TABLET BY MOUTH TWICE A DAY WITH MEAL 180 tablet 3  . metoprolol (LOPRESSOR) 50 MG tablet Take 1 tablet (50 mg total) by mouth 2 (two) times daily. 60 tablet 3  . ONE TOUCH ULTRA TEST test strip TEST SUGAR TWICE A DAY 100 each 3  . simvastatin (ZOCOR) 40 MG tablet Take 1 tablet (40 mg total) by mouth every evening. 90 tablet 2  . sitaGLIPtin (JANUVIA)  50 MG tablet Take 1 tablet (50 mg total) by mouth daily. 90 tablet 1   No facility-administered medications prior to visit.    Review of Systems;  Patient denies headache, fevers, malaise, unintentional weight loss, skin rash, eye pain, sinus congestion and sinus pain, sore throat, dysphagia,  hemoptysis , cough, dyspnea, wheezing, chest pain, palpitations, orthopnea, edema, abdominal pain, nausea, melena, diarrhea, constipation, flank pain, dysuria, hematuria, urinary  Frequency, nocturia, numbness, tingling, seizures,  Focal weakness, Loss of consciousness,  Tremor, insomnia, depression, anxiety, and suicidal ideation.      Objective:  BP 190/80 mmHg  Pulse 85  Temp(Src) 97.4 F (36.3 C) (Oral)  Resp 12  Ht 5\' 3"  (1.6 m)  Wt 138 lb (62.596 kg)  BMI 24.45 kg/m2  SpO2 99%  BP Readings from Last 3 Encounters:  01/22/16 190/80  12/31/15 180/84  11/29/15 172/82    Wt Readings from Last 3 Encounters:  01/22/16 138 lb (62.596 kg)  12/31/15 134 lb 4 oz (60.895 kg)  08/30/15 139 lb (63.05 kg)    General appearance: alert, cooperative and appears stated age Ears: normal TM's and external ear canals both ears Throat: lips, mucosa, and tongue normal; teeth and gums normal Neck: no adenopathy, no carotid bruit, supple, symmetrical, trachea midline and thyroid not enlarged, symmetric, no tenderness/mass/nodules Back: symmetric, no  curvature. ROM normal. No CVA tenderness. Lungs: clear to auscultation bilaterally Heart: regular rate and rhythm, S1, S2 normal, no murmur, click, rub or gallop Abdomen: soft, non-tender; bowel sounds normal; no masses,  no organomegaly Pulses: 2+ and symmetric Skin: Skin color, texture, turgor normal. No rashes or lesions Lymph nodes: Cervical, supraclavicular, and axillary nodes normal.  Lab Results  Component Value Date   HGBA1C 9.5* 11/29/2015   HGBA1C 8.8* 07/24/2015   HGBA1C 8.3* 03/27/2015    Lab Results  Component Value Date   CREATININE  1.25* 11/29/2015   CREATININE 1.16 07/24/2015   CREATININE 1.03 03/27/2015    Lab Results  Component Value Date   WBC 6.1 04/22/2012   HGB 13.2 04/22/2012   HCT 41.7 04/22/2012   PLT 191 04/22/2012   GLUCOSE 277* 11/29/2015   CHOL 173 07/24/2015   TRIG 114.0 07/24/2015   HDL 40.10 07/24/2015   LDLDIRECT 118.0 11/29/2015   LDLCALC 110* 07/24/2015   ALT 13 11/29/2015   AST 11 11/29/2015   NA 137 11/29/2015   K 3.9 11/29/2015   CL 100 11/29/2015   CREATININE 1.25* 11/29/2015   BUN 36* 11/29/2015   CO2 27 11/29/2015   HGBA1C 9.5* 11/29/2015   MICROALBUR 3.0* 11/29/2015    Mm Diag Breast Tomo Uni Left  08/26/2015  CLINICAL DATA:  Six month re-evaluation probably benign left breast nodule. EXAM: DIGITAL DIAGNOSTIC LEFT MAMMOGRAM WITH 3D TOMOSYNTHESIS AND CAD COMPARISON:  09/28/2014, 10/08/2014. ACR Breast Density Category c: The breast tissue is heterogeneously dense, which may obscure small masses. FINDINGS: The left breast parenchymal pattern is stable. There is a small, oval, circumscribed nodule located within the anterior portion of the upper-outer quadrant of the left breast. On the 3D views there is central lucency most consistent with a small benign intramammary lymph node. There are no additional findings. Mammographic images were processed with CAD. IMPRESSION: Stable nodule located within the upper outer quadrant left breast most consistent with a benign intramammary lymph node. Recommend bilateral diagnostic mammography in 6 months. RECOMMENDATION: Bilateral diagnostic mammography in 6 months. I have discussed the findings and recommendations with the patient. Results were also provided in writing at the conclusion of the visit. If applicable, a reminder letter will be sent to the patient regarding the next appointment. BI-RADS CATEGORY  3: Probably benign. Electronically Signed   By: Altamese Cabal M.D.   On: 08/26/2015 10:17    Assessment & Plan:   Problem List Items  Addressed This Visit    White coat hypertension    24 hour ambulatory BP monitor ordered for persisent elevation      Uncontrolled type 2 diabetes with renal manifestation ([place]) - Primary    Today we reduced her dose on NPH insulin  to 16 units at night and continued 10 units in the morning .  She has been advised to increase or decrease by 3 units prn hyper/hypoglycemia .  Continue ACE I and ARB, due to uncontrolled hypertension .  metformin , januvia, simvastatin and glipizide   Lab Results  Component Value Date   HGBA1C 9.5* 11/29/2015   Lab Results  Component Value Date   MICROALBUR 3.0* 11/29/2015            I am having Ms. Dottavio maintain her ONE TOUCH ULTRA TEST, INS SYRINGE/NEEDLE 1CC/28G, simvastatin, lisinopril, amLODipine, hydrochlorothiazide, glipiZIDE, losartan, metFORMIN, metoprolol, sitaGLIPtin, and insulin NPH Human.  No orders of the defined types were placed in this encounter.    There are  no discontinued medications.  Follow-up: No Follow-up on file.   Crecencio Mc, MD

## 2016-01-22 NOTE — Progress Notes (Signed)
Pre visit review using our clinic review tool, if applicable. No additional management support is needed unless otherwise documented below in the visit note. 

## 2016-01-25 NOTE — Assessment & Plan Note (Signed)
24 hour ambulatory BP monitor ordered for persisent elevation

## 2016-01-25 NOTE — Assessment & Plan Note (Addendum)
Today we reduced her dose on NPH insulin  to 16 units at night and continued 10 units in the morning .  She has been advised to increase or decrease by 3 units prn hyper/hypoglycemia .  Continue ACE I and ARB, due to uncontrolled hypertension .  metformin , januvia, simvastatin and glipizide   Lab Results  Component Value Date   HGBA1C 9.5* 11/29/2015   Lab Results  Component Value Date   MICROALBUR 3.0* 11/29/2015

## 2016-02-05 ENCOUNTER — Telehealth: Payer: Self-pay | Admitting: Internal Medicine

## 2016-02-05 NOTE — Telephone Encounter (Signed)
Called to see where she was getting her diabetic supplies from.

## 2016-02-06 NOTE — Telephone Encounter (Signed)
Pt states that she is getting her diabetic supplies through [facility]

## 2016-02-24 ENCOUNTER — Ambulatory Visit: Payer: Medicare HMO | Admitting: Cardiology

## 2016-03-25 ENCOUNTER — Encounter: Payer: Self-pay | Admitting: Internal Medicine

## 2016-03-25 ENCOUNTER — Ambulatory Visit (INDEPENDENT_AMBULATORY_CARE_PROVIDER_SITE_OTHER): Payer: Medicare HMO | Admitting: Internal Medicine

## 2016-03-25 VITALS — BP 168/96 | HR 85 | Temp 98.4°F | Resp 12 | Ht 63.0 in | Wt 139.5 lb

## 2016-03-25 DIAGNOSIS — IMO0001 Reserved for inherently not codable concepts without codable children: Secondary | ICD-10-CM

## 2016-03-25 DIAGNOSIS — R030 Elevated blood-pressure reading, without diagnosis of hypertension: Secondary | ICD-10-CM | POA: Diagnosis not present

## 2016-03-25 DIAGNOSIS — E0865 Diabetes mellitus due to underlying condition with hyperglycemia: Secondary | ICD-10-CM | POA: Diagnosis not present

## 2016-03-25 DIAGNOSIS — N182 Chronic kidney disease, stage 2 (mild): Secondary | ICD-10-CM | POA: Diagnosis not present

## 2016-03-25 DIAGNOSIS — E1122 Type 2 diabetes mellitus with diabetic chronic kidney disease: Secondary | ICD-10-CM

## 2016-03-25 DIAGNOSIS — E0822 Diabetes mellitus due to underlying condition with diabetic chronic kidney disease: Secondary | ICD-10-CM

## 2016-03-25 DIAGNOSIS — E1165 Type 2 diabetes mellitus with hyperglycemia: Secondary | ICD-10-CM

## 2016-03-25 DIAGNOSIS — E785 Hyperlipidemia, unspecified: Secondary | ICD-10-CM

## 2016-03-25 DIAGNOSIS — Z794 Long term (current) use of insulin: Secondary | ICD-10-CM

## 2016-03-25 DIAGNOSIS — IMO0002 Reserved for concepts with insufficient information to code with codable children: Secondary | ICD-10-CM

## 2016-03-25 LAB — COMPREHENSIVE METABOLIC PANEL
ALBUMIN: 4.5 g/dL (ref 3.5–5.2)
ALK PHOS: 101 U/L (ref 39–117)
ALT: 19 U/L (ref 0–35)
AST: 17 U/L (ref 0–37)
BILIRUBIN TOTAL: 0.5 mg/dL (ref 0.2–1.2)
BUN: 27 mg/dL — ABNORMAL HIGH (ref 6–23)
CALCIUM: 9.8 mg/dL (ref 8.4–10.5)
CHLORIDE: 102 meq/L (ref 96–112)
CO2: 25 mEq/L (ref 19–32)
CREATININE: 0.92 mg/dL (ref 0.40–1.20)
GFR: 78.37 mL/min (ref >60.00)
Glucose, Bld: 184 mg/dL — ABNORMAL HIGH (ref 70–99)
Potassium: 4.1 mEq/L (ref 3.5–5.1)
Sodium: 137 mEq/L (ref 135–145)
TOTAL PROTEIN: 8.4 g/dL — AB (ref 6.0–8.3)

## 2016-03-25 LAB — LDL CHOLESTEROL, DIRECT: LDL DIRECT: 249 mg/dL

## 2016-03-25 LAB — LIPID PANEL
CHOLESTEROL: 339 mg/dL — AB (ref 0–200)
HDL: 55.5 mg/dL (ref >39.00)
LDL Cholesterol: 264 mg/dL — ABNORMAL HIGH (ref 0–99)
NONHDL: 283.51
Total CHOL/HDL Ratio: 6
Triglycerides: 100 mg/dL (ref 0.0–149.0)
VLDL: 20 mg/dL (ref 0.0–40.0)

## 2016-03-25 LAB — HEMOGLOBIN A1C: HEMOGLOBIN A1C: 10.8 % — AB (ref 4.6–6.5)

## 2016-03-25 NOTE — Patient Instructions (Addendum)
Continue 10 units of NPH in the morning ,  And increase the evening  dose  to 18 units   I'll see you in 3 months

## 2016-03-25 NOTE — Progress Notes (Signed)
Pre-visit discussion using our clinic review tool. No additional management support is needed unless otherwise documented below in the visit note.  

## 2016-03-25 NOTE — Progress Notes (Signed)
Subjective:  Patient ID: Julie Dominguez, female    DOB: 03/20/1951  Age: 65 y.o. MRN: [id number]  CC: The primary encounter diagnosis was Uncontrolled type 2 diabetes mellitus with stage 2 chronic kidney disease, with long-term current use of insulin ([place]). Diagnoses of Hyperlipidemia, White coat hypertension, and Diabetes mellitus due to underlying condition, uncontrolled, with stage 2 chronic kidney disease, without long-term current use of insulin ([place]) were also pertinent to this visit.  HPI [facility] presents for follow up on uncontrolled diabetes mellitus with hypertension and hyperlipidemia.  She was last  seen in February.  She reports compliance with insulin.  She has brought her blood sugar log for the last week and states that she is taking 16 units of  NPH before bedtime and 10 units in the morning .  Her fasting sugars have been elevated,  But her afternoon sugars have been under better control.   Feeling fine,  Denies chest pain and shortness of breath.   Foot exam done   Outpatient Prescriptions Prior to Visit  Medication Sig Dispense Refill  . amLODipine (NORVASC) 10 MG tablet TAKE 1 TABLET (10 MG TOTAL) BY MOUTH DAILY. 90 tablet 2  . glipiZIDE (GLUCOTROL) 10 MG tablet TAKE 1 TABLET BY MOUTH TWICE A DAY BEFORE MEALS 180 tablet 0  . hydrochlorothiazide (HYDRODIURIL) 25 MG tablet Take 1 tablet (25 mg total) by mouth daily. 90 tablet 3  . INS SYRINGE/NEEDLE 1CC/28G (B-D INSULIN SYRINGE 1CC/28G) 28G X 1/2" 1 ML MISC 1 Syringe by Does not apply route 2 (two) times daily. 100 each 11  . insulin NPH Human (NOVOLIN N) 100 UNIT/ML injection 25 units in the am and 15 units  at bedtime 40 mL 3  . lisinopril (PRINIVIL,ZESTRIL) 40 MG tablet TAKE 1 TABLET (40 MG TOTAL) BY MOUTH DAILY.... DOSE CHANGE 90 tablet 4  . losartan (COZAAR) 100 MG tablet TAKE 1 TABLET BY MOUTH EVERY DAY 90 tablet 1  . metFORMIN (GLUCOPHAGE) 850 MG tablet TAKE 1 TABLET BY MOUTH TWICE A DAY WITH MEAL  180 tablet 3  . metoprolol (LOPRESSOR) 50 MG tablet Take 1 tablet (50 mg total) by mouth 2 (two) times daily. 60 tablet 3  . ONE TOUCH ULTRA TEST test strip TEST SUGAR TWICE A DAY 100 each 3  . sitaGLIPtin (JANUVIA) 50 MG tablet Take 1 tablet (50 mg total) by mouth daily. 90 tablet 1  . simvastatin (ZOCOR) 40 MG tablet Take 1 tablet (40 mg total) by mouth every evening. 90 tablet 2   No facility-administered medications prior to visit.    Review of Systems;  Patient denies headache, fevers, malaise, unintentional weight loss, skin rash, eye pain, sinus congestion and sinus pain, sore throat, dysphagia,  hemoptysis , cough, dyspnea, wheezing, chest pain, palpitations, orthopnea, edema, abdominal pain, nausea, melena, diarrhea, constipation, flank pain, dysuria, hematuria, urinary  Frequency, nocturia, numbness, tingling, seizures,  Focal weakness, Loss of consciousness,  Tremor, insomnia, depression, anxiety, and suicidal ideation.      Objective:  BP 168/96 mmHg  Pulse 85  Temp(Src) 98.4 F (36.9 C) (Oral)  Resp 12  Ht 5\' 3"  (1.6 m)  Wt 139 lb 8 oz (63.277 kg)  BMI 24.72 kg/m2  SpO2 97%  BP Readings from Last 3 Encounters:  03/25/16 168/96  01/22/16 190/80  12/31/15 180/84    Wt Readings from Last 3 Encounters:  03/25/16 139 lb 8 oz (63.277 kg)  01/22/16 138 lb (62.596 kg)  12/31/15 134 lb 4 oz (  60.895 kg)    General appearance: alert, cooperative and appears stated age Ears: normal TM's and external ear canals both ears Throat: lips, mucosa, and tongue normal; teeth and gums normal Neck: no adenopathy, no carotid bruit, supple, symmetrical, trachea midline and thyroid not enlarged, symmetric, no tenderness/mass/nodules Back: symmetric, no curvature. ROM normal. No CVA tenderness. Lungs: clear to auscultation bilaterally Heart: regular rate and rhythm, S1, S2 normal, no murmur, click, rub or gallop Abdomen: soft, non-tender; bowel sounds normal; no masses,  no  organomegaly Pulses: 2+ and symmetric Skin: Skin color, texture, turgor normal. No rashes or lesions Lymph nodes: Cervical, supraclavicular, and axillary nodes normal.  Lab Results  Component Value Date   HGBA1C 10.8* 03/25/2016   HGBA1C 9.5* 11/29/2015   HGBA1C 8.8* 07/24/2015    Lab Results  Component Value Date   CREATININE 0.92 03/25/2016   CREATININE 1.25* 11/29/2015   CREATININE 1.16 07/24/2015    Lab Results  Component Value Date   WBC 6.1 04/22/2012   HGB 13.2 04/22/2012   HCT 41.7 04/22/2012   PLT 191 04/22/2012   GLUCOSE 184* 03/25/2016   CHOL 339* 03/25/2016   TRIG 100.0 03/25/2016   HDL 55.50 03/25/2016   LDLDIRECT 249.0 03/25/2016   LDLCALC 264* 03/25/2016   ALT 19 03/25/2016   AST 17 03/25/2016   NA 137 03/25/2016   K 4.1 03/25/2016   CL 102 03/25/2016   CREATININE 0.92 03/25/2016   BUN 27* 03/25/2016   CO2 25 03/25/2016   HGBA1C 10.8* 03/25/2016   MICROALBUR 3.0* 11/29/2015    Mm Diag Breast Tomo Uni Left  08/26/2015  CLINICAL DATA:  Six month re-evaluation probably benign left breast nodule. EXAM: DIGITAL DIAGNOSTIC LEFT MAMMOGRAM WITH 3D TOMOSYNTHESIS AND CAD COMPARISON:  09/28/2014, 10/08/2014. ACR Breast Density Category c: The breast tissue is heterogeneously dense, which may obscure small masses. FINDINGS: The left breast parenchymal pattern is stable. There is a small, oval, circumscribed nodule located within the anterior portion of the upper-outer quadrant of the left breast. On the 3D views there is central lucency most consistent with a small benign intramammary lymph node. There are no additional findings. Mammographic images were processed with CAD. IMPRESSION: Stable nodule located within the upper outer quadrant left breast most consistent with a benign intramammary lymph node. Recommend bilateral diagnostic mammography in 6 months. RECOMMENDATION: Bilateral diagnostic mammography in 6 months. I have discussed the findings and recommendations  with the patient. Results were also provided in writing at the conclusion of the visit. If applicable, a reminder letter will be sent to the patient regarding the next appointment. BI-RADS CATEGORY  3: Probably benign. Electronically Signed   By: Altamese Cabal M.D.   On: 08/26/2015 10:17    Assessment & Plan:   Problem List Items Addressed This Visit    Hyperlipidemia    Uncontrolled on simvastatin.  Compliance doubted.d  Change to atorvastatin      Relevant Orders   Lipid panel (Completed)   LDL cholesterol, direct (Completed)   RESOLVED: Uncontrolled type 2 diabetes with renal manifestation ([place]) - Primary    Her diabetes remains uncontrolled despite eliminating the financial barrier ( I have paid for her insulin to prevent lapses in treatment).  The blood sugars she reports are not congruent with her current A1c of 10.8  Referral to Dr Renne Crigler is recommended.    Lab Results  Component Value Date   HGBA1C 10.8* 03/25/2016         Relevant Orders  Comprehensive metabolic panel (Completed)   Hemoglobin A1c (Completed)   Ambulatory referral to Endocrinology   Uncontrolled diabetes mellitus with stage 2 chronic kidney disease ([facility])    Uncontrolled ,  Despite elimination of financial obstacles related to medication (cost of insulin) .  Her A1c does not correlate with relported blood sugars,.  Increase evening dose by 2 units to 18 units and continue mornign dose of 10 units.  REfer to Dr Renne Crigler.  Lab Results  Component Value Date   HGBA1C 10.8* 03/25/2016   Lab Results  Component Value Date   MICROALBUR 3.0* 11/29/2015         Relevant Orders   Comprehensive metabolic panel (Completed)   Hemoglobin A1c (Completed)   Ambulatory referral to Endocrinology   White coat hypertension    24 hour ambulatory BP monitor  Has been ordered for determination of control given her persisent elevation          A total of 25 minutes of face to face time was spent with patient more  than half of which was spent in counselling and coordination of care    I am having Ms. Dannenberg maintain her ONE TOUCH ULTRA TEST, INS SYRINGE/NEEDLE 1CC/28G, lisinopril, amLODipine, hydrochlorothiazide, glipiZIDE, losartan, metFORMIN, metoprolol, sitaGLIPtin, and insulin NPH Human.  No orders of the defined types were placed in this encounter.    There are no discontinued medications.  Follow-up: Return in about 3 months (around 06/24/2016) for follow up diabetes.   Crecencio Mc, MD

## 2016-03-27 ENCOUNTER — Other Ambulatory Visit: Payer: Self-pay | Admitting: Internal Medicine

## 2016-03-27 MED ORDER — ATORVASTATIN CALCIUM 20 MG PO TABS: 20.0000 mg | ORAL_TABLET | Freq: Every day | ORAL | Status: DC

## 2016-03-27 NOTE — Assessment & Plan Note (Signed)
Her diabetes remains uncontrolled despite eliminating the financial barrier ( I have paid for her insulin to prevent lapses in treatment).  The blood sugars she reports are not congruent with her current A1c of 10.8  Referral to Dr Renne Crigler is recommended.    Lab Results  Component Value Date   HGBA1C 10.8* 03/25/2016

## 2016-03-27 NOTE — Assessment & Plan Note (Addendum)
24 hour ambulatory BP monitor  Has been ordered for determination of control given her persisent elevation

## 2016-03-28 DIAGNOSIS — E1165 Type 2 diabetes mellitus with hyperglycemia: Secondary | ICD-10-CM

## 2016-03-28 DIAGNOSIS — IMO0002 Reserved for concepts with insufficient information to code with codable children: Secondary | ICD-10-CM | POA: Insufficient documentation

## 2016-03-28 DIAGNOSIS — N182 Chronic kidney disease, stage 2 (mild): Secondary | ICD-10-CM

## 2016-03-28 DIAGNOSIS — E1122 Type 2 diabetes mellitus with diabetic chronic kidney disease: Secondary | ICD-10-CM | POA: Insufficient documentation

## 2016-03-28 NOTE — Assessment & Plan Note (Signed)
Uncontrolled ,  Despite elimination of financial obstacles related to medication (cost of insulin) .  Her A1c does not correlate with relported blood sugars,.  Increase evening dose by 2 units to 18 units and continue mornign dose of 10 units.  REfer to Dr Renne Crigler.  Lab Results  Component Value Date   HGBA1C 10.8* 03/25/2016   Lab Results  Component Value Date   MICROALBUR 3.0* 11/29/2015

## 2016-03-28 NOTE — Assessment & Plan Note (Signed)
Uncontrolled on simvastatin.  Compliance doubted.d  Change to atorvastatin

## 2016-03-30 DIAGNOSIS — E1165 Type 2 diabetes mellitus with hyperglycemia: Secondary | ICD-10-CM | POA: Diagnosis not present

## 2016-03-31 ENCOUNTER — Ambulatory Visit: Payer: Medicare HMO | Admitting: Cardiology

## 2016-04-15 ENCOUNTER — Encounter: Payer: Self-pay | Admitting: Endocrinology

## 2016-05-05 ENCOUNTER — Other Ambulatory Visit: Payer: Self-pay | Admitting: Internal Medicine

## 2016-06-03 ENCOUNTER — Ambulatory Visit: Payer: Medicare HMO | Admitting: Cardiology

## 2016-06-17 ENCOUNTER — Telehealth: Payer: Self-pay | Admitting: Internal Medicine

## 2016-06-17 NOTE — Telephone Encounter (Signed)
Please advise if this is received, thanks 

## 2016-06-17 NOTE — Telephone Encounter (Signed)
[place] [phone] called from [place] [facility] regarding a form to filled out for pt to get a back brace. Fax to [phone]. It will be faxed today. Thank you!

## 2016-06-17 NOTE — Telephone Encounter (Signed)
Noted, thanks!

## 2016-06-17 NOTE — Telephone Encounter (Signed)
Will watch for this but normally these are a company that has told the patient they can get this free of charge, insurance will pay and usually MD don't sign . Problem list show no reason for Back Brace.

## 2016-06-18 ENCOUNTER — Telehealth: Payer: Self-pay

## 2016-06-18 NOTE — Telephone Encounter (Signed)
AWV appointment scheduled after upcoming follow up with PCP on 07/29/16.

## 2016-06-18 NOTE — Telephone Encounter (Signed)
Patient is on the list for [employer] 2017 and may be a good candidate for an AWV in 2017. Please let me know if/when appt is scheduled.   

## 2016-06-22 DIAGNOSIS — E1165 Type 2 diabetes mellitus with hyperglycemia: Secondary | ICD-10-CM | POA: Diagnosis not present

## 2016-06-26 ENCOUNTER — Ambulatory Visit: Payer: Medicare HMO | Admitting: Internal Medicine

## 2016-06-29 ENCOUNTER — Ambulatory Visit: Payer: Medicare HMO | Admitting: Internal Medicine

## 2016-07-21 ENCOUNTER — Other Ambulatory Visit: Payer: Self-pay | Admitting: Internal Medicine

## 2016-07-29 ENCOUNTER — Encounter: Payer: Self-pay | Admitting: Internal Medicine

## 2016-07-29 ENCOUNTER — Ambulatory Visit (INDEPENDENT_AMBULATORY_CARE_PROVIDER_SITE_OTHER): Payer: Medicare HMO | Admitting: Internal Medicine

## 2016-07-29 VITALS — BP 198/88 | HR 72 | Temp 98.4°F | Resp 12 | Ht 63.0 in | Wt 134.8 lb

## 2016-07-29 DIAGNOSIS — E0822 Diabetes mellitus due to underlying condition with diabetic chronic kidney disease: Secondary | ICD-10-CM

## 2016-07-29 DIAGNOSIS — Z1159 Encounter for screening for other viral diseases: Secondary | ICD-10-CM

## 2016-07-29 DIAGNOSIS — Z794 Long term (current) use of insulin: Secondary | ICD-10-CM

## 2016-07-29 DIAGNOSIS — E2839 Other primary ovarian failure: Secondary | ICD-10-CM

## 2016-07-29 DIAGNOSIS — R030 Elevated blood-pressure reading, without diagnosis of hypertension: Secondary | ICD-10-CM

## 2016-07-29 DIAGNOSIS — E0865 Diabetes mellitus due to underlying condition with hyperglycemia: Secondary | ICD-10-CM

## 2016-07-29 DIAGNOSIS — IMO0001 Reserved for inherently not codable concepts without codable children: Secondary | ICD-10-CM

## 2016-07-29 DIAGNOSIS — Z23 Encounter for immunization: Secondary | ICD-10-CM | POA: Diagnosis not present

## 2016-07-29 DIAGNOSIS — Z0000 Encounter for general adult medical examination without abnormal findings: Secondary | ICD-10-CM

## 2016-07-29 DIAGNOSIS — E785 Hyperlipidemia, unspecified: Secondary | ICD-10-CM

## 2016-07-29 DIAGNOSIS — N182 Chronic kidney disease, stage 2 (mild): Secondary | ICD-10-CM

## 2016-07-29 DIAGNOSIS — Z0001 Encounter for general adult medical examination with abnormal findings: Secondary | ICD-10-CM

## 2016-07-29 DIAGNOSIS — IMO0002 Reserved for concepts with insufficient information to code with codable children: Secondary | ICD-10-CM

## 2016-07-29 LAB — COMPREHENSIVE METABOLIC PANEL WITH GFR
ALT: 17 U/L (ref 0–35)
AST: 18 U/L (ref 0–37)
Albumin: 4.5 g/dL (ref 3.5–5.2)
Alkaline Phosphatase: 109 U/L (ref 39–117)
BUN: 28 mg/dL — ABNORMAL HIGH (ref 6–23)
CO2: 26 meq/L (ref 19–32)
Calcium: 9 mg/dL (ref 8.4–10.5)
Chloride: 101 meq/L (ref 96–112)
Creatinine, Ser: 1.09 mg/dL (ref 0.40–1.20)
GFR: 64.37 mL/min
Glucose, Bld: 199 mg/dL — ABNORMAL HIGH (ref 70–99)
Potassium: 4 meq/L (ref 3.5–5.1)
Sodium: 136 meq/L (ref 135–145)
Total Bilirubin: 0.7 mg/dL (ref 0.2–1.2)
Total Protein: 8.6 g/dL — ABNORMAL HIGH (ref 6.0–8.3)

## 2016-07-29 LAB — HEMOGLOBIN A1C: HEMOGLOBIN A1C: 9.4 % — AB (ref 4.6–6.5)

## 2016-07-29 LAB — MICROALBUMIN / CREATININE URINE RATIO
Creatinine,U: 186 mg/dL
MICROALB UR: 26.9 mg/dL — AB (ref 0.0–1.9)
MICROALB/CREAT RATIO: 14.5 mg/g (ref 0.0–30.0)

## 2016-07-29 NOTE — Progress Notes (Signed)
Subjective:   Julie Dominguez is a 65 y.o. female who presents for an Initial Medicare Annual Wellness Visit.  Review of Systems    No ROS.  Medicare Wellness Visit.  Cardiac Risk Factors include: advanced age (>22men, >64 women);diabetes mellitus;hypertension     Objective:    Today's Vitals   07/29/16 1059  BP: (!) 198/88  Pulse: 72  Resp: 12  Temp: 98.4 F (36.9 C)  TempSrc: Oral  SpO2: 98%  Weight: 134 lb 12 oz (61.1 kg)  Height: 5\' 3"  (1.6 m)   Body mass index is 23.87 kg/m.   Current Medications (verified) Outpatient Encounter Prescriptions as of 07/29/2016  Medication Sig  . amLODipine (NORVASC) 10 MG tablet TAKE 1 TABLET (10 MG TOTAL) BY MOUTH DAILY.  Marland Kitchen atorvastatin (LIPITOR) 20 MG tablet Take 1 tablet (20 mg total) by mouth daily.  Marland Kitchen glipiZIDE (GLUCOTROL) 10 MG tablet TAKE 1 TABLET BY MOUTH TWICE A DAY BEFORE MEALS  . hydrochlorothiazide (HYDRODIURIL) 25 MG tablet Take 1 tablet (25 mg total) by mouth daily.  . INS SYRINGE/NEEDLE 1CC/28G (B-D INSULIN SYRINGE 1CC/28G) 28G X 1/2" 1 ML MISC 1 Syringe by Does not apply route 2 (two) times daily.  Marland Kitchen lisinopril (PRINIVIL,ZESTRIL) 40 MG tablet TAKE 1 TABLET (40 MG TOTAL) BY MOUTH DAILY.... DOSE CHANGE  . losartan (COZAAR) 100 MG tablet TAKE 1 TABLET BY MOUTH EVERY DAY  . metFORMIN (GLUCOPHAGE) 850 MG tablet TAKE 1 TABLET BY MOUTH TWICE A DAY WITH MEAL  . metoprolol succinate (TOPROL-XL) 50 MG 24 hr tablet TAKE 1 TABLET (50 MG TOTAL) BY MOUTH 2 (TWO) TIMES DAILY.  Marland Kitchen NOVOLIN N 100 UNIT/ML injection TAKE 20 UNITS AT BEDTIME AND 12 UNITS AT BREAKFAST  . ONE TOUCH ULTRA TEST test strip TEST SUGAR TWICE A DAY  . sitaGLIPtin (JANUVIA) 50 MG tablet Take 1 tablet (50 mg total) by mouth daily.  . [DISCONTINUED] glipiZIDE (GLUCOTROL) 5 MG tablet    No facility-administered encounter medications on file as of 07/29/2016.     Allergies (verified) Review of patient's allergies indicates no known allergies.   History: Past  Medical History:  Diagnosis Date  . Diabetes mellitus 2007  . Hyperlipidemia   . Hypertension 1988   uncontrolled    Past Surgical History:  Procedure Laterality Date  . EYE SURGERY Bilateral August 2013, Oct 2013   catraract with lens implant dingledein    Family History  Problem Relation Age of Onset  . Stroke Mother   . Hypertension Mother   . Heart disease Daughter   . Cancer - Colon Maternal Aunt    Social History   Occupational History  . Not on file.   Social History Main Topics  . Smoking status: Never Smoker  . Smokeless tobacco: Never Used  . Alcohol use No  . Drug use: No  . Sexual activity: Not Currently    Tobacco Counseling Counseling given: Not Answered   Activities of Daily Living In your present state of health, do you have any difficulty performing the following activities: 07/29/2016  Hearing? N  Vision? N  Difficulty concentrating or making decisions? N  Walking or climbing stairs? N  Dressing or bathing? N  Doing errands, shopping? N  Preparing Food and eating ? N  Using the Toilet? N  In the past six months, have you accidently leaked urine? N  Do you have problems with loss of bowel control? N  Managing your Medications? N  Managing your Finances? N  Housekeeping  or managing your Housekeeping? N  Some recent data might be hidden    Immunizations and Health Maintenance Immunization History  Administered Date(s) Administered  . Influenza,inj,Quad PF,36+ Mos 08/29/2014, 07/25/2015  . Pneumococcal Conjugate-13 01/31/2014  . Pneumococcal Polysaccharide-23 02/01/2008, 08/30/2015   Health Maintenance Due  Topic Date Due  . TETANUS/TDAP  06/28/1968  . ZOSTAVAX  06/28/2009  . DEXA SCAN  06/28/2014  . OPHTHALMOLOGY EXAM  06/10/2015    Patient Care Team: Crecencio Mc, MD as PCP - General (Internal Medicine) Seeplaputhur Robinette Haines, MD (General Surgery) Crecencio Mc, MD (Internal Medicine)  Indicate any recent Medical Services you  may have received from other than [facility] providers in the past year (date may be approximate).     Assessment:   This is a routine wellness examination for Julie Dominguez.  The goal of the wellness visit is to assist the patient how to close the gaps in care and create a preventative care plan for the patient.   Osteoporosis risk reviewed.  DEXA Scan scheduled.  Educational material provided.  Medications reviewed; taking without issues or barriers.  Safety issues reviewed; smoke and carbon monoxide detectors in the home. No firearms in the home. Wears seatbelts when driving or riding with others. No violence in the home.  No identified risk were noted; The patient was oriented x 3; appropriate in dress and manner and no objective failures at ADL's or IADL's.   Body mass index; discussed the importance of a healthy diet, water intake and exercise. She has a healthy diet with adequate water intake and exercise.    Hepatitis C screening completed.  Educational material provided.  TDAP and ZOSTAVAX vaccine postponed for follow up with insurance.  Health maintenance gaps; closed.  Patient Concerns: None at this time. Follow up with PCP as needed.  Hearing/Vision screen Hearing Screening Comments: Passed the whisper test Vision Screening Comments: [place] Bilateral cataracts extracted Wears readers only   Dietary issues and exercise activities discussed: Current Exercise Habits: Home exercise routine, Time (Minutes): 15, Frequency (Times/Week): 7, Weekly Exercise (Minutes/Week): 105, Intensity: Mild  Goals    . Healthy Lifestyle          Stay hydrated and drink plenty of fluids. Low carb foods.  Lean meats, vegetables. Stretch and continue chair exercises.      Depression Screen PHQ 2/9 Scores 07/29/2016 11/29/2015 04/26/2015  PHQ - 2 Score 0 0 0    Fall Risk Fall Risk  07/29/2016 11/29/2015  Falls in the past year? No No    Cognitive Function: MMSE -  Mini Mental State Exam 07/29/2016  Orientation to time 5  Orientation to Place 5  Registration 3  Attention/ Calculation 5  Recall 3  Language- name 2 objects 2  Language- repeat 1  Language- follow 3 step command 3  Language- read & follow direction 1  Write a sentence 1  Copy design 1  Total score 30    Screening Tests Health Maintenance  Topic Date Due  . TETANUS/TDAP  06/28/1968  . ZOSTAVAX  06/28/2009  . DEXA SCAN  06/28/2014  . OPHTHALMOLOGY EXAM  06/10/2015  . HEMOGLOBIN A1C  09/24/2016  . MAMMOGRAM  09/28/2016  . FOOT EXAM  12/30/2016  . COLONOSCOPY  04/18/2024  . INFLUENZA VACCINE  Completed  . Hepatitis C Screening  Completed  . PNA vac Low Risk Adult  Completed      Plan:   End of life planning; Advance aging; Advanced directives discussed.  No HCPOA/Living Will.  Educational material provided.  Time spent discussing this topic is 23 minutes.  Encouraged to follow up with PCP as needed.   During the course of the visit, Julie Dominguez was educated and counseled about the following appropriate screening and preventive services:   Vaccines to include Pneumoccal, Influenza, Hepatitis B, Td, Zostavax, HCV  Electrocardiogram  Cardiovascular disease screening  Colorectal cancer screening  Bone density screening  Diabetes screening  Glaucoma screening  Mammography/PAP  Nutrition counseling  Smoking cessation counseling  Patient Instructions (the written plan) were given to the patient.    OBrien-Blaney, Shakeda Pearse L, LPN   [phone]      I have reviewed the above information and agree with above.   Deborra Medina, MD

## 2016-07-29 NOTE — Progress Notes (Signed)
Subjective:  Patient ID: Julie Dominguez, female    DOB: 09/28/1951  Age: 65 y.o. MRN: [id number]  CC: The primary encounter diagnosis was Hyperlipidemia. Diagnoses of Diabetes mellitus due to underlying condition, uncontrolled, with stage 2 chronic kidney disease, with long-term current use of insulin ([place]), Need for hepatitis C screening test, Mediacre annual wellness, initial, Estrogen deficiency, Encounter for immunization, and White coat hypertension were also pertinent to this visit.  HPI [facility]el presents for follow up on type 2 DM,  Hypertension,  Both uncontrolled.  Last seen 3 months ago . Was referred to Endocrinology for assistance in controlled her DM. n spite of removing the financial barriers to insulin use (I paid for her insulin),  And close follow up (monthly) I was not able to help her get the BS down .  Unfortunately , she did not keep the appointment because she is uncomfortable driving to [facility] and prefers to see an endocrinologist in  [place] or [place].   . She brought a log of the last week's worth of sugars  Fastings 130 to 240  Takes her NPH  insulin AFTER  she eats, AROUND 10 AM , AND THEN AGAIN AT 11 PM   Did not increase the evening dose last time  To 18 units ,  Still  using 15 in the evening and 10- units in the morning  eats dinner at 6 pm,  Breakfast around 10 am  , lunch is light , like a salad at 1 or 2 pm .  T  afternoon sugars  ALL OVER THE PLACE .Mar[place] Kitchen  Range from 140 to 200 Was referred to Cardiology for uncontrolled hypertension (for 24 hr ambulatory monitor) in February but she has not kept those appointments either  Cites financial barriers as she gets a monthly paycheck  And the appoints were scheduled for the time of month when she could not afford to go   Lab Results  Component Value Date   HGBA1C 9.4 (H) 07/29/2016     Outpatient Medications Prior to Visit  Medication Sig Dispense Refill  . amLODipine (NORVASC) 10 MG tablet  TAKE 1 TABLET (10 MG TOTAL) BY MOUTH DAILY. 90 tablet 2  . glipiZIDE (GLUCOTROL) 10 MG tablet TAKE 1 TABLET BY MOUTH TWICE A DAY BEFORE MEALS 180 tablet 0  . hydrochlorothiazide (HYDRODIURIL) 25 MG tablet Take 1 tablet (25 mg total) by mouth daily. 90 tablet 3  . INS SYRINGE/NEEDLE 1CC/28G (B-D INSULIN SYRINGE 1CC/28G) 28G X 1/2" 1 ML MISC 1 Syringe by Does not apply route 2 (two) times daily. 100 each 11  . lisinopril (PRINIVIL,ZESTRIL) 40 MG tablet TAKE 1 TABLET (40 MG TOTAL) BY MOUTH DAILY.... DOSE CHANGE 90 tablet 4  . losartan (COZAAR) 100 MG tablet TAKE 1 TABLET BY MOUTH EVERY DAY 90 tablet 1  . metFORMIN (GLUCOPHAGE) 850 MG tablet TAKE 1 TABLET BY MOUTH TWICE A DAY WITH MEAL 180 tablet 3  . metoprolol succinate (TOPROL-XL) 50 MG 24 hr tablet TAKE 1 TABLET (50 MG TOTAL) BY MOUTH 2 (TWO) TIMES DAILY. 180 tablet 3  . NOVOLIN N 100 UNIT/ML injection TAKE 20 UNITS AT BEDTIME AND 12 UNITS AT BREAKFAST 10 mL 2  . ONE TOUCH ULTRA TEST test strip TEST SUGAR TWICE A DAY 100 each 3  . sitaGLIPtin (JANUVIA) 50 MG tablet Take 1 tablet (50 mg total) by mouth daily. 90 tablet 1  . atorvastatin (LIPITOR) 20 MG tablet Take 1 tablet (20 mg total) by mouth  daily. 90 tablet 2   No facility-administered medications prior to visit.     Review of Systems;  Patient denies headache, fevers, malaise, unintentional weight loss, skin rash, eye pain, sinus congestion and sinus pain, sore throat, dysphagia,  hemoptysis , cough, dyspnea, wheezing, chest pain, palpitations, orthopnea, edema, abdominal pain, nausea, melena, diarrhea, constipation, flank pain, dysuria, hematuria, urinary  Frequency, nocturia, numbness, tingling, seizures,  Focal weakness, Loss of consciousness,  Tremor, insomnia, depression, anxiety, and suicidal ideation.      Objective:  BP (!) 198/88   Pulse 72   Temp 98.4 F (36.9 C) (Oral)   Resp 12   Ht 5\' 3"  (1.6 m)   Wt 134 lb 12 oz (61.1 kg)   SpO2 98%   BMI 23.87 kg/m   BP Readings  from Last 3 Encounters:  07/29/16 (!) 198/88  03/25/16 (!) 168/96  01/22/16 (!) 190/80    Wt Readings from Last 3 Encounters:  07/29/16 134 lb 12 oz (61.1 kg)  03/25/16 139 lb 8 oz (63.3 kg)  01/22/16 138 lb (62.6 kg)    General appearance: alert, cooperative and appears stated age Ears: normal TM's and external ear canals both ears Throat: lips, mucosa, and tongue normal; teeth and gums normal Neck: no adenopathy, no carotid bruit, supple, symmetrical, trachea midline and thyroid not enlarged, symmetric, no tenderness/mass/nodules Back: symmetric, no curvature. ROM normal. No CVA tenderness. Lungs: clear to auscultation bilaterally Heart: regular rate and rhythm, S1, S2 normal, no murmur, click, rub or gallop Abdomen: soft, non-tender; bowel sounds normal; no masses,  no organomegaly Pulses: 2+ and symmetric Skin: Skin color, texture, turgor normal. No rashes or lesions Lymph nodes: Cervical, supraclavicular, and axillary nodes normal.  Lab Results  Component Value Date   HGBA1C 9.4 (H) 07/29/2016   HGBA1C 10.8 (H) 03/25/2016   HGBA1C 9.5 (H) 11/29/2015    Lab Results  Component Value Date   CREATININE 1.09 07/29/2016   CREATININE 0.92 03/25/2016   CREATININE 1.25 (H) 11/29/2015    Lab Results  Component Value Date   WBC 6.1 04/22/2012   HGB 13.2 04/22/2012   HCT 41.7 04/22/2012   PLT 191 04/22/2012   GLUCOSE 199 (H) 07/29/2016   CHOL 339 (H) 03/25/2016   TRIG 89 07/29/2016   HDL 54 07/29/2016   LDLDIRECT 145 (H) 07/29/2016   LDLCALC 264 (H) 03/25/2016   ALT 17 07/29/2016   AST 18 07/29/2016   NA 136 07/29/2016   K 4.0 07/29/2016   CL 101 07/29/2016   CREATININE 1.09 07/29/2016   BUN 28 (H) 07/29/2016   CO2 26 07/29/2016   HGBA1C 9.4 (H) 07/29/2016   MICROALBUR 26.9 (H) 07/29/2016    Mm Diag Breast Tomo Uni Left  Result Date: 08/26/2015 CLINICAL DATA:  Six month re-evaluation probably benign left breast nodule. EXAM: DIGITAL DIAGNOSTIC LEFT MAMMOGRAM  WITH 3D TOMOSYNTHESIS AND CAD COMPARISON:  09/28/2014, 10/08/2014. ACR Breast Density Category c: The breast tissue is heterogeneously dense, which may obscure small masses. FINDINGS: The left breast parenchymal pattern is stable. There is a small, oval, circumscribed nodule located within the anterior portion of the upper-outer quadrant of the left breast. On the 3D views there is central lucency most consistent with a small benign intramammary lymph node. There are no additional findings. Mammographic images were processed with CAD. IMPRESSION: Stable nodule located within the upper outer quadrant left breast most consistent with a benign intramammary lymph node. Recommend bilateral diagnostic mammography in 6 months. RECOMMENDATION: Bilateral diagnostic mammography  in 6 months. I have discussed the findings and recommendations with the patient. Results were also provided in writing at the conclusion of the visit. If applicable, a reminder letter will be sent to the patient regarding the next appointment. BI-RADS CATEGORY  3: Probably benign. Electronically Signed   By: Altamese Cabal M.D.   On: 08/26/2015 10:17    Assessment & Plan:   Problem List Items Addressed This Visit    White coat hypertension    She has been referred again to cardiology for a 24 hr BP monitor. No changes today       Relevant Medications   atorvastatin (LIPITOR) 40 MG tablet   Hyperlipidemia - Primary    Improved but LDL is not at goal on Lipitor 20 mg , direct LDL is 145.  Will increase to 40 mg  Lab Results  Component Value Date   CHOL 339 (H) 03/25/2016   HDL 54 07/29/2016   LDLCALC 264 (H) 03/25/2016   LDLDIRECT 145 (H) 07/29/2016   TRIG 89 07/29/2016   CHOLHDL 4.0 07/29/2016         Relevant Medications   atorvastatin (LIPITOR) 40 MG tablet   Other Relevant Orders   Lipid Panel w/Direct LDL:HDL Ratio (Completed)   Uncontrolled diabetes mellitus with stage 2 chronic kidney disease ([place])    Uncontrolled  ,  Despite elimination of financial obstacles related to medication (cost of insulin) .  Her A1c does not correlate with relported blood sugars,.and she did not increase her evening dose by 2 units to 18 units as directed at last visit, She did not keep appt with  Dr Renne Crigler.  Referral to Dr Gabriel Carina and increase in evengin insulin made to 18 units and am dose to 13 units.  BiWeekly submission of sugars requested.  Lab Results  Component Value Date   HGBA1C 9.4 (H) 07/29/2016   Lab Results  Component Value Date   MICROALBUR 26.9 (H) 07/29/2016         Relevant Medications   atorvastatin (LIPITOR) 40 MG tablet   Other Relevant Orders   Hemoglobin A1c (Completed)   Comprehensive metabolic panel (Completed)   Microalbumin / creatinine urine ratio (Completed)   Ambulatory referral to Endocrinology    Other Visit Diagnoses    Need for hepatitis C screening test       Relevant Orders   Hepatitis C antibody screen (Completed)   Mediacre annual wellness, initial       Estrogen deficiency       Relevant Orders   [place]   Encounter for immunization       Relevant Orders   Flu vaccine HIGH DOSE PF (Completed)      I have changed Ms. Lastra's atorvastatin. I am also having her maintain her ONE TOUCH ULTRA TEST, INS SYRINGE/NEEDLE 1CC/28G, lisinopril, amLODipine, hydrochlorothiazide, glipiZIDE, losartan, metFORMIN, sitaGLIPtin, NOVOLIN N, and metoprolol succinate.  Meds ordered this encounter  Medications  . DISCONTD: glipiZIDE (GLUCOTROL) 5 MG tablet  . atorvastatin (LIPITOR) 40 MG tablet    Sig: Take 0.5 tablets (20 mg total) by mouth daily.    Dispense:  90 tablet    Refill:  1    Medications Discontinued During This Encounter  Medication Reason  . glipiZIDE (GLUCOTROL) 5 MG tablet Error  . atorvastatin (LIPITOR) 20 MG tablet Reorder    Follow-up: No Follow-up on file.   Crecencio Mc, MD

## 2016-07-29 NOTE — Patient Instructions (Addendum)
Increase your dose  Of evening insulin to 18 units   Take your insulin every 12 hours, regardless of when you eat   Increase your morning  dose to 13 units   Drop off your blood sugars weekly so I can adjust your insulin doses   PLEASE keep the appointment with Dr Loel Dubonnet (cardiology)  I am sending you to Dr Fredna Dow,  , a diabetes specialist, to help get your sugars under better control    Bone Densitometry Bone densitometry is an imaging test that uses a special X-ray to measure the amount of calcium and other minerals in your bones (bone density). This test is also known as a bone mineral density test or dual-energy X-ray absorptiometry (DXA). The test can measure bone density at your hip and your spine. It is similar to having a regular X-ray. You may have this test to:  Diagnose a condition that causes weak or thin bones (osteoporosis).  Predict your risk of a broken bone (fracture).  Determine how well osteoporosis treatment is working. LET [facility] CARE PROVIDER KNOW ABOUT:  Any allergies you have.  All medicines you are taking, including vitamins, herbs, eye drops, creams, and over-the-counter medicines.  Previous problems you or members of your family have had with the use of anesthetics.  Any blood disorders you have.  Previous surgeries you have had.  Medical conditions you have.  Possibility of pregnancy.  Any other medical test you had within the previous 14 days that used contrast material. RISKS AND COMPLICATIONS Generally, this is a safe procedure. However, problems can occur and may include the following:  This test exposes you to a very small amount of radiation.  The risks of radiation exposure may be greater to unborn children. BEFORE THE PROCEDURE  Do not take any calcium supplements for 24 hours before having the test. You can otherwise eat and drink what you usually do.  Take off all metal jewelry, eyeglasses, dental appliances, and any other  metal objects. PROCEDURE  You may lie on an exam table. There will be an X-ray generator below you and an imaging device above you.  Other devices, such as boxes or braces, may be used to position your body properly for the scan.  You will need to lie still while the machine slowly scans your body.  The images will show up on a computer monitor. AFTER THE PROCEDURE You may need more testing at a later time.   This information is not intended to replace advice given to you by your health care provider. Make sure you discuss any questions you have with your health care provider.   Document Released: 12/08/2004 Document Revised: 12/07/2014 Document Reviewed: 04/26/2014 Elsevier Interactive Patient Education [employer].

## 2016-07-29 NOTE — Progress Notes (Signed)
Pre-visit discussion using our clinic review tool. No additional management support is needed unless otherwise documented below in the visit note.  

## 2016-07-30 LAB — HEPATITIS C ANTIBODY: HCV Ab: NEGATIVE

## 2016-07-30 LAB — LIPID PANEL W/DIRECT LDL/HDL RATIO
CHOL/HDL RATIO: 4 ratio (ref <=5.0)
CHOLESTEROL - DIR LDL/HDL RATIO: 215 mg/dL — AB (ref 125–200)
Direct LDL: 145 mg/dL — ABNORMAL HIGH (ref <130)
HDL: 54 mg/dL (ref >=46)
LDL:HDL Ratio: 2.7 Ratio
TRIGLYCERIDES: 89 mg/dL (ref <150)

## 2016-07-30 MED ORDER — ATORVASTATIN CALCIUM 40 MG PO TABS: 20.0000 mg | ORAL_TABLET | Freq: Every day | ORAL | 1 refills | Status: DC

## 2016-07-30 NOTE — Assessment & Plan Note (Signed)
Uncontrolled ,  Despite elimination of financial obstacles related to medication (cost of insulin) .  Her A1c does not correlate with relported blood sugars,.and she did not increase her evening dose by 2 units to 18 units as directed at last visit, She did not keep appt with  Dr Renne Crigler.  Referral to Dr Gabriel Carina and increase in evengin insulin made to 18 units and am dose to 13 units.  BiWeekly submission of sugars requested.  Lab Results  Component Value Date   HGBA1C 9.4 (H) 07/29/2016   Lab Results  Component Value Date   MICROALBUR 26.9 (H) 07/29/2016

## 2016-07-30 NOTE — Assessment & Plan Note (Signed)
She has been referred again to cardiology for a 24 hr BP monitor. No changes today

## 2016-07-30 NOTE — Assessment & Plan Note (Signed)
Improved but LDL is not at goal on Lipitor 20 mg , direct LDL is 145.  Will increase to 40 mg  Lab Results  Component Value Date   CHOL 339 (H) 03/25/2016   HDL 54 07/29/2016   LDLCALC 264 (H) 03/25/2016   LDLDIRECT 145 (H) 07/29/2016   TRIG 89 07/29/2016   CHOLHDL 4.0 07/29/2016

## 2016-08-18 ENCOUNTER — Other Ambulatory Visit: Payer: Self-pay | Admitting: Internal Medicine

## 2016-08-24 DIAGNOSIS — E1142 Type 2 diabetes mellitus with diabetic polyneuropathy: Secondary | ICD-10-CM | POA: Diagnosis not present

## 2016-08-24 DIAGNOSIS — Z794 Long term (current) use of insulin: Secondary | ICD-10-CM | POA: Diagnosis not present

## 2016-08-24 DIAGNOSIS — I10 Essential (primary) hypertension: Secondary | ICD-10-CM | POA: Diagnosis not present

## 2016-08-24 DIAGNOSIS — E7800 Pure hypercholesterolemia, unspecified: Secondary | ICD-10-CM | POA: Diagnosis not present

## 2016-08-24 DIAGNOSIS — Z0000 Encounter for general adult medical examination without abnormal findings: Secondary | ICD-10-CM | POA: Diagnosis not present

## 2016-08-26 ENCOUNTER — Ambulatory Visit: Payer: Medicare HMO

## 2016-08-28 ENCOUNTER — Ambulatory Visit: Payer: Medicare HMO | Admitting: Cardiology

## 2016-09-11 DIAGNOSIS — E1165 Type 2 diabetes mellitus with hyperglycemia: Secondary | ICD-10-CM | POA: Diagnosis not present

## 2016-09-17 ENCOUNTER — Other Ambulatory Visit: Payer: Self-pay | Admitting: Internal Medicine

## 2016-09-23 ENCOUNTER — Other Ambulatory Visit: Payer: Self-pay | Admitting: Internal Medicine

## 2016-09-29 ENCOUNTER — Ambulatory Visit: Payer: Medicare HMO | Admitting: Cardiology

## 2016-10-26 ENCOUNTER — Ambulatory Visit: Payer: Medicare HMO | Admitting: Internal Medicine

## 2016-10-29 ENCOUNTER — Ambulatory Visit: Payer: Medicare HMO | Admitting: Cardiology

## 2016-11-27 ENCOUNTER — Ambulatory Visit: Payer: Medicare HMO | Admitting: Cardiology

## 2016-12-08 ENCOUNTER — Ambulatory Visit: Payer: Medicare HMO | Admitting: Cardiology

## 2016-12-18 ENCOUNTER — Other Ambulatory Visit: Payer: Self-pay | Admitting: Internal Medicine

## 2016-12-24 ENCOUNTER — Ambulatory Visit (INDEPENDENT_AMBULATORY_CARE_PROVIDER_SITE_OTHER): Payer: Medicare HMO | Admitting: Cardiology

## 2016-12-24 ENCOUNTER — Encounter: Payer: Self-pay | Admitting: Cardiology

## 2016-12-24 VITALS — BP 228/98 | HR 79 | Ht 63.0 in | Wt 130.5 lb

## 2016-12-24 DIAGNOSIS — R9431 Abnormal electrocardiogram [ECG] [EKG]: Secondary | ICD-10-CM

## 2016-12-24 DIAGNOSIS — I10 Essential (primary) hypertension: Secondary | ICD-10-CM

## 2016-12-24 NOTE — Patient Instructions (Addendum)
Labwork: Your physician recommends that you return for lab work first thing in the morning. Go the the [facility] Entrance of the hospital and check in at the front desk with the lab slips.   Testing/Procedures: Your physician has requested that you have an echocardiogram. Echocardiography is a painless test that uses sound waves to create images of your heart. It provides your doctor with information about the size and shape of your heart and how well your heart's chambers and valves are working. This procedure takes approximately one hour. There are no restrictions for this procedure.  Your physician has requested that you have a renal artery duplex. During this test, an ultrasound is used to evaluate blood flow to the kidneys. Allow one hour for this exam. Do not eat after midnight the day before and avoid carbonated beverages. Take your medications as you usually do.  Your physician has requested that you regularly monitor and record your blood pressure readings at home. Please use the same machine at the same time of day to check your readings and record them to bring to your follow-up visit.  Call if blood pressures remain greater than 140/80.  Follow-Up: Your physician recommends that you schedule a follow-up appointment after testing with Dr. Yvone Neu.  It was a pleasure seeing you today here in the office. Please do not hesitate to give [place] a call back if you have any further questions. [place], BSN     Echocardiogram An echocardiogram, or echocardiography, uses sound waves (ultrasound) to produce an image of your heart. The echocardiogram is simple, painless, obtained within a short period of time, and offers valuable information to your health care provider. The images from an echocardiogram can provide information such as:  Evidence of coronary artery disease (CAD).  Heart size.  Heart muscle function.  Heart valve function.  Aneurysm  detection.  Evidence of a past heart attack.  Fluid buildup around the heart.  Heart muscle thickening.  Assess heart valve function. Tell a health care provider about:  Any allergies you have.  All medicines you are taking, including vitamins, herbs, eye drops, creams, and over-the-counter medicines.  Any problems you or family members have had with anesthetic medicines.  Any blood disorders you have.  Any surgeries you have had.  Any medical conditions you have.  Whether you are pregnant or may be pregnant. What happens before the procedure? No special preparation is needed. Eat and drink normally. What happens during the procedure?  In order to produce an image of your heart, gel will be applied to your chest and a wand-like tool (transducer) will be moved over your chest. The gel will help transmit the sound waves from the transducer. The sound waves will harmlessly bounce off your heart to allow the heart images to be captured in real-time motion. These images will then be recorded.  You may need an IV to receive a medicine that improves the quality of the pictures. What happens after the procedure? You may return to your normal schedule including diet, activities, and medicines, unless your health care provider tells you otherwise. This information is not intended to replace advice given to you by your health care provider. Make sure you discuss any questions you have with your health care provider. Document Released: 11/13/2000 Document Revised: 07/04/2016 Document Reviewed: 07/24/2013 Elsevier Interactive Patient Education  2017 [employer].

## 2016-12-24 NOTE — Progress Notes (Signed)
Cardiology Office Note   Date:  1/61/0960   ID:  Julie Dominguez, Julie Dominguez May 30, 1951, MRN [id number]  Referring Doctor:  Crecencio Mc, MD   Cardiologist:   Wende Bushy, MD   Reason for consultation:  Chief Complaint  Patient presents with  . other    Ref by Dr. Derrel Nip for HTN. Meds reviewed by the pt. verbally.       History of Present Illness: Julie Dominguez is a 66 y.o. female who presents for Evaluation and management of hypertension  Patient did not take her medications today. At home, usually it is in the 160s over 90s. The lowest was 147 systolic. She does not have any symptoms associated with her elevated blood pressure. No chest pain, no shortness of breath and no headaches, no vision changes. She recalls being diagnosed with hypertension in her 23s. She remembers having several testing done but cannot recall the details. She reports that there is hypertension that runs in the family, also developing at a young age.  As mentioned she has no chest pain or shortness of breath. No palpitations, lightheadedness, dizziness. No PND, orthopnea, edema. No abdominal pain. No bleeding.   ROS:  Please see the history of present illness. Aside from mentioned under HPI, all other systems are reviewed and negative.     Past Medical History:  Diagnosis Date  . Diabetes mellitus 2007  . Hyperlipidemia   . Hypertension 1988   uncontrolled     Past Surgical History:  Procedure Laterality Date  . EYE SURGERY Bilateral August 2013, Oct 2013   catraract with lens implant dingledein      reports that she has never smoked. She has never used smokeless tobacco. She reports that she does not drink alcohol or use drugs.   family history includes Cancer - Colon in her maternal aunt; Heart disease in her daughter; Hypertension in her mother; Stroke in her mother.   Outpatient Medications Prior to Visit  Medication Sig Dispense Refill  . amLODipine (NORVASC) 10 MG tablet  TAKE 1 TABLET (10 MG TOTAL) BY MOUTH DAILY. 90 tablet 2  . atorvastatin (LIPITOR) 40 MG tablet Take 0.5 tablets (20 mg total) by mouth daily. 90 tablet 1  . glipiZIDE (GLUCOTROL) 10 MG tablet TAKE 1 TABLET BY MOUTH TWICE A DAY BEFORE MEALS 180 tablet 0  . glipiZIDE (GLUCOTROL) 5 MG tablet TAKE 1 TABLET BY MOUTH EVERY MORNING AND TAKE 2 TABLETS BY MOUTH EVERY EVENING BEFORE MEALS 90 tablet 3  . hydrochlorothiazide (HYDRODIURIL) 25 MG tablet Take 1 tablet (25 mg total) by mouth daily. 90 tablet 3  . INS SYRINGE/NEEDLE 1CC/28G (B-D INSULIN SYRINGE 1CC/28G) 28G X 1/2" 1 ML MISC 1 Syringe by Does not apply route 2 (two) times daily. 100 each 11  . lisinopril (PRINIVIL,ZESTRIL) 40 MG tablet TAKE 1 TABLET (40 MG TOTAL) BY MOUTH DAILY.... DOSE CHANGE 90 tablet 2  . losartan (COZAAR) 100 MG tablet TAKE 1 TABLET BY MOUTH EVERY DAY 90 tablet 1  . metFORMIN (GLUCOPHAGE) 850 MG tablet TAKE 1 TABLET BY MOUTH TWICE A DAY WITH MEAL 180 tablet 0  . metoprolol succinate (TOPROL-XL) 50 MG 24 hr tablet TAKE 1 TABLET (50 MG TOTAL) BY MOUTH 2 (TWO) TIMES DAILY. 180 tablet 3  . NOVOLIN N 100 UNIT/ML injection TAKE 20 UNITS AT BEDTIME AND 12 UNITS AT BREAKFAST 10 mL 2  . ONE TOUCH ULTRA TEST test strip TEST SUGAR TWICE A DAY 100 each 3  . sitaGLIPtin (  JANUVIA) 50 MG tablet Take 1 tablet (50 mg total) by mouth daily. 90 tablet 1   No facility-administered medications prior to visit.      Allergies: Patient has no known allergies.    PHYSICAL EXAM: VS:  BP (!) 228/98 (BP Location: Left Arm, Patient Position: Sitting, Cuff Size: Normal)   Pulse 79   Ht 5\' 3"  (1.6 m)   Wt 130 lb 8 oz (59.2 kg)   BMI 23.12 kg/m  , Body mass index is 23.12 kg/m. Wt Readings from Last 3 Encounters:  12/24/16 130 lb 8 oz (59.2 kg)  07/29/16 134 lb 12 oz (61.1 kg)  03/25/16 139 lb 8 oz (63.3 kg)    GENERAL:  well developed, well nourished, not in acute distress HEENT: normocephalic, pink conjunctivae, anicteric sclerae, no  xanthelasma, normal dentition, oropharynx clear NECK:  no neck vein engorgement, JVP normal, no hepatojugular reflux, carotid upstroke brisk and symmetric, no bruit, no thyromegaly, no lymphadenopathy LUNGS:  good respiratory effort, clear to auscultation bilaterally CV:  PMI not displaced, no thrills, no lifts, S1 and S2 within normal limits, no palpable S3 or S4, no murmurs, no rubs, no gallops ABD:  Soft, nontender, nondistended, normoactive bowel sounds, Positive for bruit over the right side of the abdomen as well as the central area, over the area of the right renal artery, no hepatomegaly, no splenomegaly MS: nontender back, no kyphosis, no scoliosis, no joint deformities EXT:  2+ DP/PT pulses, no edema, no varicosities, no cyanosis, no clubbing SKIN: warm, nondiaphoretic, normal turgor, no ulcers NEUROPSYCH: alert, oriented to person, place, and time, sensory/motor grossly intact, normal mood, appropriate affect  Recent Labs: 07/29/2016: ALT 17; BUN 28; Creatinine, Ser 1.09; Potassium 4.0; Sodium 136   Lipid Panel    Component Value Date/Time   CHOL 339 (H) 03/25/2016 1138   CHOL 355 (H) 03/24/2012 1227   TRIG 89 07/29/2016 1227   TRIG 123 03/24/2012 1227   HDL 54 07/29/2016 1227   HDL 64 (H) 03/24/2012 1227   CHOLHDL 4.0 07/29/2016 1227   CHOLHDL 6 03/25/2016 1138   VLDL 20.0 03/25/2016 1138   VLDL 25 03/24/2012 1227   LDLCALC 264 (H) 03/25/2016 1138   LDLCALC 266 (H) 03/24/2012 1227   LDLDIRECT 145 (H) 07/29/2016 1227     Other studies Reviewed:  EKG:  The ekg from 12/24/2016 was personally reviewed by me and it revealed sinus rhythm, 81 BPM. PACs and blocked PAC nonspecific ST-T wave changes.  Additional studies/ records that were reviewed personally reviewed by me today include: None available   ASSESSMENT AND PLAN:  Hypertension, uncontrolled PACs and nonspecific changes on EKG. Patient on the following medications per PCP: Amlodipine 10 mg  daily Hydrochlorothiazide 25 g daily Losartan 100 mg daily Seroquel 40 mg daily Metoprolol succinate 50 mg twice a day  Blood pressure here in the office is not reflective of her usual measurement. She did not take any of her medications yet. There is also an element of whitecoat hypertension. Recommend blood pressure log. We will workup for secondary causes of hypertension: PA-C/PRA, fractionated metanephrines, TSH and free T4 We will send for renal artery duplex ultrasound, she has bruit over the right renal artery. To be done ASAP. Check echocardiogram. She will follow-up in the office with blood pressure log. Potentially we can use BiDil 1 tab tid.   Current medicines are reviewed at length with the patient today.  The patient does not have concerns regarding medicines.  Labs/ tests ordered  today include:  Orders Placed This Encounter  Procedures  . Metanephrines, plasma  . Aldosterone + renin activity w/ ratio  . TSH  . T4, free  . EKG 12-Lead  . ECHOCARDIOGRAM COMPLETE    I had a lengthy and detailed discussion with the patient regarding diagnoses, prognosis, diagnostic options, treatment options .   I counseled the patient on importance of lifestyle modification including heart healthy diet, regular physical activity   Disposition:   FU with undersigned after tests  Thank you for this consultation. We will forwarding this consultation to referring physician.   I spent at least 45 minutes with the patient today and more than 50% of the time was spent counseling the patient and coordinating care.   Signed, Wende Bushy, MD  12/24/2016 12:13 PM    [place]  This note was generated in part with voice recognition software and I apologize for any typographical errors that were not detected and corrected.

## 2016-12-28 ENCOUNTER — Ambulatory Visit: Payer: Medicare HMO | Admitting: Internal Medicine

## 2017-01-08 ENCOUNTER — Ambulatory Visit: Payer: Medicare HMO | Admitting: Internal Medicine

## 2017-01-13 ENCOUNTER — Other Ambulatory Visit: Payer: Self-pay | Admitting: Cardiology

## 2017-01-13 DIAGNOSIS — I10 Essential (primary) hypertension: Secondary | ICD-10-CM

## 2017-01-13 DIAGNOSIS — R9431 Abnormal electrocardiogram [ECG] [EKG]: Secondary | ICD-10-CM

## 2017-01-18 ENCOUNTER — Other Ambulatory Visit
Admission: RE | Admit: 2017-01-18 | Discharge: 2017-01-18 | Disposition: A | Discharge status: Home / Self Care | Payer: Medicare HMO | Source: Ambulatory Visit | Attending: Cardiology | Admitting: Cardiology

## 2017-01-18 DIAGNOSIS — R9431 Abnormal electrocardiogram [ECG] [EKG]: Secondary | ICD-10-CM | POA: Insufficient documentation

## 2017-01-18 DIAGNOSIS — I10 Essential (primary) hypertension: Secondary | ICD-10-CM | POA: Diagnosis not present

## 2017-01-18 LAB — TSH: TSH: 2.35 u[IU]/mL (ref 0.350–4.500)

## 2017-01-18 LAB — T4, FREE: FREE T4: 0.92 ng/dL (ref 0.61–1.12)

## 2017-01-19 ENCOUNTER — Other Ambulatory Visit: Payer: Medicare HMO

## 2017-01-20 ENCOUNTER — Ambulatory Visit: Payer: Medicare HMO

## 2017-01-20 DIAGNOSIS — R9431 Abnormal electrocardiogram [ECG] [EKG]: Secondary | ICD-10-CM

## 2017-01-20 DIAGNOSIS — I10 Essential (primary) hypertension: Secondary | ICD-10-CM | POA: Diagnosis not present

## 2017-01-20 LAB — ECHOCARDIOGRAM COMPLETE
Area-P 1/2: 1.33 cm2
E decel time: 264 msec
EERAT: 9.18
FS: 49 % — AB (ref 28–44)
IVS/LV PW RATIO, ED: 1.08
LA ID, A-P, ES: 29 mm
LA vol index: 22.9 mL/m2
LADIAMINDEX: 1.8 cm/m2
LAVOL: 36.9 mL
LAVOLA4C: 32.8 mL
LEFT ATRIUM END SYS DIAM: 29 mm
LV E/e'average: 9.18
LV PW d: 13 mm — AB (ref 0.6–1.1)
LV SIMPSON'S DISK: 71
LV TDI E'LATERAL: 7.4
LV TDI E'MEDIAL: 3.92
LV dias vol: 55 mL (ref 46–106)
LV sys vol index: 10 mL/m2
LVDIAVOLIN: 34 mL/m2
LVEEMED: 9.18
LVELAT: 7.4 cm/s
LVOT SV: 45 mL
LVOT VTI: 17.6 cm
LVOT area: 2.54 cm2
LVOTD: 18 mm
LVSYSVOL: 16 mL (ref 14–42)
MV Dec: 264
MVPKAVEL: 99.4 m/s
MVPKEVEL: 67.9 m/s
MVSPHT: 166 ms
RV LATERAL S' VELOCITY: 15.6 cm/s
Stroke v: 39 ml
TAPSE: 19.6 mm

## 2017-01-20 LAB — METANEPHRINES, PLASMA
METANEPHRINE FREE: 12 pg/mL (ref 0–62)
NORMETANEPHRINE FREE: 86 pg/mL (ref 0–145)

## 2017-01-21 ENCOUNTER — Telehealth: Payer: Self-pay | Admitting: *Deleted

## 2017-01-21 DIAGNOSIS — I10 Essential (primary) hypertension: Secondary | ICD-10-CM

## 2017-01-21 LAB — ALDOSTERONE + RENIN ACTIVITY W/ RATIO
ALDO / PRA Ratio: 2.7 (ref 0.0–30.0)
Aldosterone: 1.4 ng/dL (ref 0.0–30.0)
PRA LC/MS/MS: 0.514 ng/mL/h (ref 0.167–5.380)

## 2017-01-21 NOTE — Telephone Encounter (Signed)
Patient wanted to talk with me earlier and I was on the phone. Called her back and she just wanted me to repeat what I had went over before. She has an upcoming appointment on Tuesday so let her know that Dr. Yvone Neu could go into more detail with her at that time. Reviewed results with her in detail and she was appreciative for my call back. Let her know that we would see her next week. She had no further questions and let her know to call back if needed.

## 2017-01-21 NOTE — Telephone Encounter (Signed)
Spoke with patient and reviewed results and recommendations with her. She verbalized understanding and let her know that I would have someone give her a call to schedule the recommended test and she was agreement with plan. She verbalized understanding of our conversation and had no further questions at this time.

## 2017-01-21 NOTE — Telephone Encounter (Signed)
-----   Message from Wende Bushy, MD sent at 01/21/2017 10:47 AM EST ----- No evidence of renal artery stenosis. 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 Recommend aortoiliac duplex/LEA study

## 2017-01-26 ENCOUNTER — Encounter: Payer: Self-pay | Admitting: Cardiology

## 2017-01-26 ENCOUNTER — Ambulatory Visit (INDEPENDENT_AMBULATORY_CARE_PROVIDER_SITE_OTHER): Payer: Medicare HMO | Admitting: Cardiology

## 2017-01-26 VITALS — BP 190/110 | HR 82 | Ht 63.0 in | Wt 130.8 lb

## 2017-01-26 DIAGNOSIS — I10 Essential (primary) hypertension: Secondary | ICD-10-CM | POA: Diagnosis not present

## 2017-01-26 MED ORDER — ISOSORB DINITRATE-HYDRALAZINE 20-37.5 MG PO TABS: 1.0000 | ORAL_TABLET | Freq: Three times a day (TID) | ORAL | 3 refills | Status: DC

## 2017-01-26 NOTE — Progress Notes (Signed)
Cardiology Office Note   Date:  6/76/1952   ID:  Draven, Laine August 15, 1951, MRN [id number]  Referring Doctor:  Crecencio Mc, MD   Cardiologist:   Wende Bushy, MD   Reason for consultation:  Chief Complaint  Patient presents with  . OTHER    F/u Echo and renal US. Meds reviewed verbally with pt.      History of Present Illness: Julie Dominguez is a 66 y.o. female who presents for Follow-up for hypertension, after testing   Since last visit, patient reports that her blood pressure at home shows that her blood pressure is similar to the one today, still elevated. She reports or admits to sometimes, missing one or several medications for high blood pressure. This may be for a few days or for a couple of weeks at a time. She also reports consuming canned vegetables at times.  Patient denies chest pain, shortness of breath, palpitations, loss of consciousness. No headaches.  ROS:  Please see the history of present illness. Aside from mentioned under HPI, all other systems are reviewed and negative.    Past Medical History:  Diagnosis Date  . Diabetes mellitus 2007  . Hyperlipidemia   . Hypertension 1988   uncontrolled     Past Surgical History:  Procedure Laterality Date  . EYE SURGERY Bilateral August 2013, Oct 2013   catraract with lens implant dingledein      reports that she has never smoked. She has never used smokeless tobacco. She reports that she does not drink alcohol or use drugs.   family history includes Cancer - Colon in her maternal aunt; Heart disease in her daughter; Hypertension in her mother; Stroke in her mother.   Outpatient Medications Prior to Visit  Medication Sig Dispense Refill  . amLODipine (NORVASC) 10 MG tablet TAKE 1 TABLET (10 MG TOTAL) BY MOUTH DAILY. 90 tablet 2  . atorvastatin (LIPITOR) 40 MG tablet Take 0.5 tablets (20 mg total) by mouth daily. 90 tablet 1  . glipiZIDE (GLUCOTROL) 10 MG tablet TAKE 1 TABLET BY  MOUTH TWICE A DAY BEFORE MEALS 180 tablet 0  . glipiZIDE (GLUCOTROL) 5 MG tablet TAKE 1 TABLET BY MOUTH EVERY MORNING AND TAKE 2 TABLETS BY MOUTH EVERY EVENING BEFORE MEALS 90 tablet 3  . hydrochlorothiazide (HYDRODIURIL) 25 MG tablet Take 1 tablet (25 mg total) by mouth daily. 90 tablet 3  . INS SYRINGE/NEEDLE 1CC/28G (B-D INSULIN SYRINGE 1CC/28G) 28G X 1/2" 1 ML MISC 1 Syringe by Does not apply route 2 (two) times daily. 100 each 11  . lisinopril (PRINIVIL,ZESTRIL) 40 MG tablet TAKE 1 TABLET (40 MG TOTAL) BY MOUTH DAILY.... DOSE CHANGE 90 tablet 2  . losartan (COZAAR) 100 MG tablet TAKE 1 TABLET BY MOUTH EVERY DAY 90 tablet 1  . metFORMIN (GLUCOPHAGE) 850 MG tablet TAKE 1 TABLET BY MOUTH TWICE A DAY WITH MEAL 180 tablet 0  . metoprolol succinate (TOPROL-XL) 50 MG 24 hr tablet TAKE 1 TABLET (50 MG TOTAL) BY MOUTH 2 (TWO) TIMES DAILY. 180 tablet 3  . NOVOLIN N 100 UNIT/ML injection TAKE 20 UNITS AT BEDTIME AND 12 UNITS AT BREAKFAST 10 mL 2  . ONE TOUCH ULTRA TEST test strip TEST SUGAR TWICE A DAY 100 each 3  . sitaGLIPtin (JANUVIA) 50 MG tablet Take 1 tablet (50 mg total) by mouth daily. 90 tablet 1   No facility-administered medications prior to visit.      Allergies: Patient has no known allergies.  PHYSICAL EXAM: VS:  BP (!) 190/110 (BP Location: Left Arm, Patient Position: Sitting, Cuff Size: Normal)   Pulse 82   Ht 5\' 3"  (1.6 m)   Wt 130 lb 12 oz (59.3 kg)   BMI 23.16 kg/m  , Body mass index is 23.16 kg/m. Wt Readings from Last 3 Encounters:  01/26/17 130 lb 12 oz (59.3 kg)  12/24/16 130 lb 8 oz (59.2 kg)  07/29/16 134 lb 12 oz (61.1 kg)    GENERAL:  well developed, well nourished, not in acute distress HEENT: normocephalic, pink conjunctivae, anicteric sclerae, no xanthelasma, normal dentition, oropharynx clear NECK:  no neck vein engorgement, JVP normal, no hepatojugular reflux, carotid upstroke brisk and symmetric, no bruit, no thyromegaly, no lymphadenopathy LUNGS:  good  respiratory effort, clear to auscultation bilaterally CV:  PMI not displaced, no thrills, no lifts, S1 and S2 within normal limits, no palpable S3 or S4, no murmurs, no rubs, no gallops ABD:  Soft, nontender, nondistended, normoactive bowel sounds, no abdominal aortic bruit, no hepatomegaly, no splenomegaly MS: nontender back, no kyphosis, no scoliosis, no joint deformities EXT:  2+ DP/PT pulses, no edema, no varicosities, no cyanosis, no clubbing SKIN: warm, nondiaphoretic, normal turgor, no ulcers NEUROPSYCH: alert, oriented to person, place, and time, sensory/motor grossly intact, normal mood, appropriate affect  Recent Labs: 07/29/2016: ALT 17; BUN 28; Creatinine, Ser 1.09; Potassium 4.0; Sodium 136 01/18/2017: TSH 2.350   Lipid Panel    Component Value Date/Time   CHOL 339 (H) 03/25/2016 1138   CHOL 355 (H) 03/24/2012 1227   TRIG 89 07/29/2016 1227   TRIG 123 03/24/2012 1227   HDL 54 07/29/2016 1227   HDL 64 (H) 03/24/2012 1227   CHOLHDL 4.0 07/29/2016 1227   CHOLHDL 6 03/25/2016 1138   VLDL 20.0 03/25/2016 1138   VLDL 25 03/24/2012 1227   LDLCALC 264 (H) 03/25/2016 1138   LDLCALC 266 (H) 03/24/2012 1227   LDLDIRECT 145 (H) 07/29/2016 1227     Other studies Reviewed:  EKG:  The ekg from 12/24/2016 was personally reviewed by me and it revealed sinus rhythm, 81 BPM. PACs and blocked PAC nonspecific ST-T wave changes.  Additional studies/ records that were reviewed personally reviewed by me today include:  Renal artery ultrasound 01/20/2017: No evidence of renal artery stenosis. 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 Recommend aortoiliac duplex/LEA study  Echocardiogram 01/20/2017: LVEF 65-70%. Moderate LVH. Grade 1 diastolic dysfunction. LA mildly dilated. RV normal in size and function. PA peak pressure 32 mmHg.   ASSESSMENT AND PLAN:  Hypertension, uncontrolled As per last office visit 12/24/2016: Patient on the following medications per PCP: Amlodipine 10 mg  daily Hydrochlorothiazide 25 g daily Losartan 100 mg daily Losartan 40 mg daily Metoprolol succinate 50 mg twice a day BP still elevated. Recommend BiDil 1 tablet 3 times a day. Continue blood pressure monitoring.  We will workup for secondary causes of hypertension: PA-C/PRA, fractionated metanephrines, TSH and free T4 - results wnl We will send for renal artery duplex ultrasound - wnl Check echocardiogram - nl lvef, mod LVH  Follow-up in 4-6 weeks. Recommend to be to PCP regarding sleep study. As noted above, there is component of medication noncompliance due to financial constraints, and possibly also dietary indiscretion.   Current medicines are reviewed at length with the patient today.  The patient does not have concerns regarding medicines.  Labs/ tests ordered today include:  No orders of the defined types were placed in this encounter.   I had  a lengthy and detailed discussion with the patient regarding diagnoses, prognosis, diagnostic options, treatment options .   I counseled the patient on importance of lifestyle modification including heart healthy diet, regular physical activity   Disposition:   FU with undersigned after tests    Signed, Wende Bushy, MD  01/26/2017 10:51 AM    [place]  This note was generated in part with voice recognition software and I apologize for any typographical errors that were not detected and corrected.

## 2017-01-26 NOTE — Patient Instructions (Signed)
Medication Instructions:  Your physician has recommended you make the following change in your medication:  1. START Bidil 1 tablet three times daily   Your physician has requested that you regularly monitor and record your blood pressure readings at home. Please use the same machine at the same time of day to check your readings and record them to bring to your follow-up visit. Please call if blood pressures are greater than 140/80  Labwork: None ordered at this time.   Testing/Procedures: None at this time.   Follow-Up: Your physician recommends that you schedule a follow-up appointment in: 6 weeks with Dr. Yvone Neu.   It was a pleasure seeing you today here in the office. Please do not hesitate to give [place] a call back if you have any further questions. [place], BSN

## 2017-02-05 ENCOUNTER — Other Ambulatory Visit: Payer: Self-pay | Admitting: Cardiology

## 2017-02-05 DIAGNOSIS — I350 Nonrheumatic aortic (valve) stenosis: Secondary | ICD-10-CM

## 2017-02-18 ENCOUNTER — Ambulatory Visit: Payer: Medicare HMO

## 2017-02-18 ENCOUNTER — Other Ambulatory Visit: Payer: Self-pay | Admitting: Cardiology

## 2017-02-18 DIAGNOSIS — R0989 Other specified symptoms and signs involving the circulatory and respiratory systems: Secondary | ICD-10-CM | POA: Diagnosis not present

## 2017-02-18 DIAGNOSIS — Q251 Coarctation of aorta: Secondary | ICD-10-CM

## 2017-02-18 DIAGNOSIS — I350 Nonrheumatic aortic (valve) stenosis: Secondary | ICD-10-CM

## 2017-02-18 DIAGNOSIS — Q253 Supravalvular aortic stenosis: Secondary | ICD-10-CM | POA: Diagnosis not present

## 2017-02-24 ENCOUNTER — Ambulatory Visit: Payer: Medicare HMO | Admitting: Internal Medicine

## 2017-02-26 ENCOUNTER — Telehealth: Payer: Self-pay | Admitting: *Deleted

## 2017-02-26 DIAGNOSIS — I70209 Unspecified atherosclerosis of native arteries of extremities, unspecified extremity: Secondary | ICD-10-CM

## 2017-02-26 NOTE — Telephone Encounter (Signed)
-----   Message from Wende Bushy, MD sent at 02/26/2017  3:24 PM EDT ----- This was the aortoiliac study that showed no aneurysm but atherosclerosis noted with > 50% distal ao stenosis Lower extremity arterial duplex study recommended ( based on renal artery u/s rec/result note -- we shd have ordered aortoiliac/LEA study)

## 2017-02-26 NOTE — Telephone Encounter (Signed)
Spoke with patient and reviewed results and recommendations for further testing. She verbalized understanding, agreement with plan, and had no further questions at this time. Transferred her to scheduling to set up appointment. Instructed her to call back if she has any further questions.

## 2017-03-18 ENCOUNTER — Other Ambulatory Visit: Payer: Self-pay | Admitting: Cardiology

## 2017-03-18 DIAGNOSIS — I739 Peripheral vascular disease, unspecified: Secondary | ICD-10-CM

## 2017-03-25 ENCOUNTER — Ambulatory Visit: Payer: Medicare HMO | Admitting: Cardiology

## 2017-03-31 ENCOUNTER — Ambulatory Visit (INDEPENDENT_AMBULATORY_CARE_PROVIDER_SITE_OTHER): Payer: Medicare HMO | Admitting: Internal Medicine

## 2017-03-31 ENCOUNTER — Encounter: Payer: Self-pay | Admitting: Internal Medicine

## 2017-03-31 VITALS — BP 267/126 | HR 111 | Temp 98.4°F | Resp 17 | Ht 63.0 in | Wt 125.8 lb

## 2017-03-31 DIAGNOSIS — Z794 Long term (current) use of insulin: Secondary | ICD-10-CM | POA: Diagnosis not present

## 2017-03-31 DIAGNOSIS — I10 Essential (primary) hypertension: Secondary | ICD-10-CM

## 2017-03-31 DIAGNOSIS — E0822 Diabetes mellitus due to underlying condition with diabetic chronic kidney disease: Secondary | ICD-10-CM | POA: Diagnosis not present

## 2017-03-31 DIAGNOSIS — N182 Chronic kidney disease, stage 2 (mild): Secondary | ICD-10-CM

## 2017-03-31 DIAGNOSIS — E0865 Diabetes mellitus due to underlying condition with hyperglycemia: Secondary | ICD-10-CM

## 2017-03-31 DIAGNOSIS — Z1231 Encounter for screening mammogram for malignant neoplasm of breast: Secondary | ICD-10-CM

## 2017-03-31 DIAGNOSIS — Z1239 Encounter for other screening for malignant neoplasm of breast: Secondary | ICD-10-CM

## 2017-03-31 DIAGNOSIS — IMO0002 Reserved for concepts with insufficient information to code with codable children: Secondary | ICD-10-CM

## 2017-03-31 LAB — POCT GLYCOSYLATED HEMOGLOBIN (HGB A1C): HEMOGLOBIN A1C: 13.9

## 2017-03-31 MED ORDER — CLONIDINE HCL 0.1 MG PO TABS: 0.1000 mg | ORAL_TABLET | Freq: Two times a day (BID) | ORAL | Status: DC

## 2017-03-31 NOTE — Patient Instructions (Addendum)
YOUR DIABETES AND BLOOD PRESSURE ARE BOTH OUT OF CONTROL.   IF YOU DON'T START TAKING YOUR BLOOD PRESSURE MEDICATIONS ,  YOU ARE GOING TO HAVE A STROKE  I AM GIVING YOU A 7 DAY SUPPLY OF CLONIDINE TO TAKE:     one tablet EVERY 12 HOURS (UNTIL YOU CAN GET YOUR REGULAR MEDICATIONS FILLED)   PLEASE CALL THE [facility] .  They may be able to help you get your medications   [facility] also has a Programmer, applications for patients.  I have given you an application to fill out and mail to their business Office

## 2017-03-31 NOTE — Progress Notes (Signed)
Subjective:  Patient ID: Julie Dominguez, female    DOB: [birth year]  Age: 66 y.o. MRN: [id number]  CC: The primary encounter diagnosis was Screening breast examination. Diagnoses of Diabetes mellitus due to underlying condition, uncontrolled, with stage 2 chronic kidney disease, with long-term current use of insulin ([place]), Uncontrolled hypertension, and Essential hypertension were also pertinent to this visit.  HPI [facility] [place] presents for follow up on uncontrolled type 2 diabetes and uncontrolled hypertension  .  Last seen in August 2017  for her medicare wellness . a1c was 9.4 at that time and she was referred to endocrinology .  She was never seen   Has been seeing Dr Loel Dubonnet  For aortic stenosis and uncontrolled hypertesnison,  Bidil was added in February, but she did not fill the prescription and has not taken any medications for the past 3 months, including metformin   She Has not taken any of her medications in several months due to financial troubles. This has been a recurring problem for patient. Brief voluntary  review of her financial situation  indicates that after paying her mortgage she has about $800 /month to live on. Patient was given information for the [facility] Patient Financial Assistance  Application and the [facility]   Lab Results  Component Value Date   HGBA1C 13.9 03/31/2017      Outpatient Medications Prior to Visit  Medication Sig Dispense Refill  . amLODipine (NORVASC) 10 MG tablet TAKE 1 TABLET (10 MG TOTAL) BY MOUTH DAILY. 90 tablet 2  . atorvastatin (LIPITOR) 40 MG tablet Take 0.5 tablets (20 mg total) by mouth daily. 90 tablet 1  . glipiZIDE (GLUCOTROL) 10 MG tablet TAKE 1 TABLET BY MOUTH TWICE A DAY BEFORE MEALS 180 tablet 0  . glipiZIDE (GLUCOTROL) 5 MG tablet TAKE 1 TABLET BY MOUTH EVERY MORNING AND TAKE 2 TABLETS BY MOUTH EVERY EVENING BEFORE MEALS 90 tablet 3  . hydrochlorothiazide (HYDRODIURIL) 25 MG tablet Take 1 tablet (25 mg  total) by mouth daily. 90 tablet 3  . INS SYRINGE/NEEDLE 1CC/28G (B-D INSULIN SYRINGE 1CC/28G) 28G X 1/2" 1 ML MISC 1 Syringe by Does not apply route 2 (two) times daily. 100 each 11  . isosorbide-hydrALAZINE (BIDIL) 20-37.5 MG tablet Take 1 tablet by mouth 3 (three) times daily. 90 tablet 3  . lisinopril (PRINIVIL,ZESTRIL) 40 MG tablet TAKE 1 TABLET (40 MG TOTAL) BY MOUTH DAILY.... DOSE CHANGE 90 tablet 2  . losartan (COZAAR) 100 MG tablet TAKE 1 TABLET BY MOUTH EVERY DAY 90 tablet 1  . metFORMIN (GLUCOPHAGE) 850 MG tablet TAKE 1 TABLET BY MOUTH TWICE A DAY WITH MEAL 180 tablet 0  . metoprolol succinate (TOPROL-XL) 50 MG 24 hr tablet TAKE 1 TABLET (50 MG TOTAL) BY MOUTH 2 (TWO) TIMES DAILY. 180 tablet 3  . NOVOLIN N 100 UNIT/ML injection TAKE 20 UNITS AT BEDTIME AND 12 UNITS AT BREAKFAST 10 mL 2  . ONE TOUCH ULTRA TEST test strip TEST SUGAR TWICE A DAY 100 each 3  . sitaGLIPtin (JANUVIA) 50 MG tablet Take 1 tablet (50 mg total) by mouth daily. 90 tablet 1   No facility-administered medications prior to visit.     Review of Systems;  Patient denies headache, fevers, malaise, unintentional weight loss, skin rash, eye pain, sinus congestion and sinus pain, sore throat, dysphagia,  hemoptysis , cough, dyspnea, wheezing, chest pain, palpitations, orthopnea, edema, abdominal pain, nausea, melena, diarrhea, constipation, flank pain, dysuria, hematuria, urinary  Frequency, nocturia, numbness, tingling, seizures,  Focal weakness, Loss of consciousness,  Tremor, insomnia, depression, anxiety, and suicidal ideation.      Objective:  BP (!) 267/126 (BP Location: Left Arm, Patient Position: Sitting, Cuff Size: Normal)   Pulse (!) 111   Temp 98.4 F (36.9 C) (Oral)   Resp 17   Ht 5\' 3"  (1.6 m)   Wt 125 lb 12.8 oz (57.1 kg)   SpO2 98%   BMI 22.28 kg/m   BP Readings from Last 3 Encounters:  03/31/17 (!) 267/126  01/26/17 (!) 190/110  12/24/16 (!) 228/98    Wt Readings from Last 3 Encounters:    03/31/17 125 lb 12.8 oz (57.1 kg)  01/26/17 130 lb 12 oz (59.3 kg)  12/24/16 130 lb 8 oz (59.2 kg)    General appearance: alert, cooperative and appears stated age Ears: normal TM's and external ear canals both ears Throat: lips, mucosa, and tongue normal; teeth and gums normal Neck: no adenopathy, no carotid bruit, supple, symmetrical, trachea midline and thyroid not enlarged, symmetric, no tenderness/mass/nodules Back: symmetric, no curvature. ROM normal. No CVA tenderness. Lungs: clear to auscultation bilaterally Heart: regular rate and rhythm, S1, S2 normal, no murmur, click, rub or gallop Abdomen: soft, non-tender; bowel sounds normal; no masses,  no organomegaly Pulses: 2+ and symmetric Skin: Skin color, texture, turgor normal. No rashes or lesions Lymph nodes: Cervical, supraclavicular, and axillary nodes normal.  Lab Results  Component Value Date   HGBA1C 13.9 03/31/2017   HGBA1C 9.4 (H) 07/29/2016   HGBA1C 10.8 (H) 03/25/2016    Lab Results  Component Value Date   CREATININE 0.99 03/31/2017   CREATININE 1.09 07/29/2016   CREATININE 0.92 03/25/2016    Lab Results  Component Value Date   WBC 6.1 04/22/2012   HGB 13.2 04/22/2012   HCT 41.7 04/22/2012   PLT 191 04/22/2012   GLUCOSE 449 (H) 03/31/2017   CHOL 339 (H) 03/25/2016   TRIG 89 07/29/2016   HDL 54 07/29/2016   LDLDIRECT 145 (H) 07/29/2016   LDLCALC 264 (H) 03/25/2016   ALT 13 03/31/2017   AST 13 03/31/2017   NA 134 (L) 03/31/2017   K 4.8 03/31/2017   CL 96 03/31/2017   CREATININE 0.99 03/31/2017   BUN 22 03/31/2017   CO2 27 03/31/2017   TSH 2.350 01/18/2017   HGBA1C 13.9 03/31/2017   MICROALBUR 26.9 (H) 07/29/2016    No results found.  Assessment & Plan:   Problem List Items Addressed This Visit    Hypertension    Uncontrolled due to medication lapse.  Patient states that she has been unable to afford medications for the past 3 months,  She was givne samples of clonidine0.1 ng bid for 7  days and advised to seek help at the [facility]      Relevant Medications   cloNIDine (CATAPRES) tablet 0.1 mg   Uncontrolled diabetes mellitus with stage 2 chronic kidney disease ([place])    Secondary to lapse in medications for the past 3 months.  patient was given information for several pateint medication assistance programs and encouraged to call one of then for assistance  Lab Results  Component Value Date   HGBA1C 13.9 03/31/2017         Relevant Orders   POCT HgB A1C (Completed)   Comprehensive metabolic panel (Completed)    Other Visit Diagnoses    Screening breast examination    -  Primary   Relevant Orders   MM Digital Screening   Uncontrolled hypertension  Relevant Medications   cloNIDine (CATAPRES) tablet 0.1 mg   Other Relevant Orders   Comprehensive metabolic panel (Completed)    A total of 25 minutes of face to face time was spent with patient more than half of which was spent in counselling about the above mentioned conditions  and coordination of care   I am having Ms. Durio maintain her ONE TOUCH ULTRA TEST, INS SYRINGE/NEEDLE 1CC/28G, amLODipine, hydrochlorothiazide, glipiZIDE, sitaGLIPtin, NOVOLIN N, metoprolol succinate, atorvastatin, losartan, lisinopril, glipiZIDE, metFORMIN, and isosorbide-hydrALAZINE. We will continue to administer cloNIDine.  Meds ordered this encounter  Medications  . cloNIDine (CATAPRES) tablet 0.1 mg    There are no discontinued medications.  Follow-up: Return in about 3 months (around 07/01/2017).   Crecencio Mc, MD

## 2017-03-31 NOTE — Progress Notes (Signed)
Pre visit review using our clinic review tool, if applicable. No additional management support is needed unless otherwise documented below in the visit note. 

## 2017-04-01 LAB — COMPREHENSIVE METABOLIC PANEL
ALT: 13 U/L (ref 0–35)
AST: 13 U/L (ref 0–37)
Albumin: 4.3 g/dL (ref 3.5–5.2)
Alkaline Phosphatase: 126 U/L — ABNORMAL HIGH (ref 39–117)
BUN: 22 mg/dL (ref 6–23)
CO2: 27 mEq/L (ref 19–32)
Calcium: 10.2 mg/dL (ref 8.4–10.5)
Chloride: 96 mEq/L (ref 96–112)
Creatinine, Ser: 0.99 mg/dL (ref 0.40–1.20)
GFR: 71.79 mL/min (ref >60.00)
GLUCOSE: 449 mg/dL — AB (ref 70–99)
POTASSIUM: 4.8 meq/L (ref 3.5–5.1)
Sodium: 134 mEq/L — ABNORMAL LOW (ref 135–145)
Total Bilirubin: 0.7 mg/dL (ref 0.2–1.2)
Total Protein: 7.8 g/dL (ref 6.0–8.3)

## 2017-04-03 NOTE — Assessment & Plan Note (Signed)
Uncontrolled due to medication lapse.  Patient states that she has been unable to afford medications for the past 3 months,  She was givne samples of clonidine0.1 ng bid for 7 days and advised to seek help at the [facility]

## 2017-04-03 NOTE — Assessment & Plan Note (Signed)
Secondary to lapse in medications for the past 3 months.  patient was given information for several pateint medication assistance programs and encouraged to call one of then for assistance  Lab Results  Component Value Date   HGBA1C 13.9 03/31/2017

## 2017-04-22 ENCOUNTER — Ambulatory Visit: Payer: Medicare HMO | Admitting: Cardiology

## 2017-05-05 ENCOUNTER — Ambulatory Visit: Payer: Medicare HMO

## 2017-05-05 DIAGNOSIS — I739 Peripheral vascular disease, unspecified: Secondary | ICD-10-CM

## 2017-05-05 DIAGNOSIS — I70209 Unspecified atherosclerosis of native arteries of extremities, unspecified extremity: Secondary | ICD-10-CM

## 2017-05-10 ENCOUNTER — Telehealth: Payer: Self-pay | Admitting: Internal Medicine

## 2017-05-10 DIAGNOSIS — R928 Other abnormal and inconclusive findings on diagnostic imaging of breast: Secondary | ICD-10-CM

## 2017-05-10 DIAGNOSIS — Z1239 Encounter for other screening for malignant neoplasm of breast: Secondary | ICD-10-CM

## 2017-05-10 NOTE — Telephone Encounter (Signed)
Julie Dominguez from [facility] stated that pt needs a diagnostic order for 6 month follow up and yearly. Pt has a order just for Dexa. Order needed please and thank you!

## 2017-05-10 NOTE — Telephone Encounter (Signed)
Is it ok to order

## 2017-05-10 NOTE — Telephone Encounter (Signed)
Yes,  You do not need to ask.  I have done it./

## 2017-05-17 NOTE — Telephone Encounter (Signed)
Pt needs a order for [place] being that it's time for her Yearly. Thank you!

## 2017-05-17 NOTE — Telephone Encounter (Signed)
Spoke with lady at [facility] and she stated that the wrong orders had been put in so the correct ones have now been ordered.

## 2017-05-18 ENCOUNTER — Encounter: Payer: Self-pay | Admitting: Internal Medicine

## 2017-05-25 ENCOUNTER — Ambulatory Visit: Payer: Medicare HMO | Admitting: Internal Medicine

## 2017-06-18 ENCOUNTER — Other Ambulatory Visit: Payer: Medicare HMO

## 2017-07-19 ENCOUNTER — Ambulatory Visit
Admission: RE | Admit: 2017-07-19 | Discharge: 2017-07-19 | Disposition: A | Discharge status: Home / Self Care | Payer: Medicare HMO | Source: Ambulatory Visit | Attending: Internal Medicine | Admitting: Internal Medicine

## 2017-07-19 DIAGNOSIS — R928 Other abnormal and inconclusive findings on diagnostic imaging of breast: Secondary | ICD-10-CM

## 2017-07-19 DIAGNOSIS — R922 Inconclusive mammogram: Secondary | ICD-10-CM | POA: Diagnosis not present

## 2017-07-20 ENCOUNTER — Telehealth: Payer: Self-pay | Admitting: Internal Medicine

## 2017-07-20 NOTE — Telephone Encounter (Signed)
Yes, refill and please see if [facility] has any social workers who can provide some assistance to patient.  This is a recurrent problem.  If I need to make a referral to [facility] let me know Juliann Pulse could probably help with this

## 2017-07-20 NOTE — Telephone Encounter (Signed)
Spoke with pt and she stated that she has been off of the medication for at least 5 months. Would you like for me to refill these, pt has an appt on 09/01/2017?

## 2017-07-20 NOTE — Telephone Encounter (Signed)
Pt called and stated that she has not had any medication due to financial hardship. Pt would like her Metformin, glipizide, and Januvia refilled. Scheduled pt for 10/3. Please advise, thank you!  Pharmacy - [id number] - [place], [address]

## 2017-07-21 MED ORDER — GLIPIZIDE 5 MG PO TABS: ORAL_TABLET | ORAL | 0 refills | Status: DC

## 2017-07-21 MED ORDER — METFORMIN HCL 850 MG PO TABS: ORAL_TABLET | ORAL | 0 refills | Status: DC

## 2017-07-21 MED ORDER — SITAGLIPTIN PHOSPHATE 50 MG PO TABS: 50.0000 mg | ORAL_TABLET | Freq: Every day | ORAL | 0 refills | Status: DC

## 2017-07-21 NOTE — Telephone Encounter (Signed)
Medications have been refilled ?

## 2017-07-22 NOTE — Telephone Encounter (Signed)
Would you like for me to try and set her up an appt with our pharmacist as well and see if there is any thing that she may be able to help her with? Filling out the form for the [facility] referral.

## 2017-07-22 NOTE — Telephone Encounter (Signed)
Yes that would be very helpful

## 2017-07-22 NOTE — Telephone Encounter (Signed)
This might be a referral to our pharmacist too, Chrys Racer might be able to help with medications. I can show you the [facility] referral stuff too if you would like.

## 2017-07-27 ENCOUNTER — Encounter: Payer: Self-pay | Admitting: Emergency Medicine

## 2017-07-27 ENCOUNTER — Inpatient Hospital Stay
Admission: EM | Admit: 2017-07-27 | Discharge: 2017-07-29 | DRG: 065 | Disposition: A | Discharge status: Home / Self Care | Payer: Medicare HMO | Attending: Specialist | Admitting: Specialist

## 2017-07-27 ENCOUNTER — Emergency Department: Payer: Medicare HMO

## 2017-07-27 ENCOUNTER — Inpatient Hospital Stay: Payer: Medicare HMO

## 2017-07-27 DIAGNOSIS — R739 Hyperglycemia, unspecified: Secondary | ICD-10-CM | POA: Diagnosis not present

## 2017-07-27 DIAGNOSIS — R402252 Coma scale, best verbal response, oriented, at arrival to emergency department: Secondary | ICD-10-CM | POA: Diagnosis present

## 2017-07-27 DIAGNOSIS — E1122 Type 2 diabetes mellitus with diabetic chronic kidney disease: Secondary | ICD-10-CM | POA: Diagnosis not present

## 2017-07-27 DIAGNOSIS — Z79899 Other long term (current) drug therapy: Secondary | ICD-10-CM

## 2017-07-27 DIAGNOSIS — E119 Type 2 diabetes mellitus without complications: Secondary | ICD-10-CM | POA: Diagnosis not present

## 2017-07-27 DIAGNOSIS — E1165 Type 2 diabetes mellitus with hyperglycemia: Secondary | ICD-10-CM | POA: Diagnosis present

## 2017-07-27 DIAGNOSIS — Z794 Long term (current) use of insulin: Secondary | ICD-10-CM

## 2017-07-27 DIAGNOSIS — R42 Dizziness and giddiness: Secondary | ICD-10-CM | POA: Diagnosis not present

## 2017-07-27 DIAGNOSIS — I129 Hypertensive chronic kidney disease with stage 1 through stage 4 chronic kidney disease, or unspecified chronic kidney disease: Secondary | ICD-10-CM | POA: Diagnosis present

## 2017-07-27 DIAGNOSIS — I10 Essential (primary) hypertension: Secondary | ICD-10-CM | POA: Diagnosis not present

## 2017-07-27 DIAGNOSIS — R29700 NIHSS score 0: Secondary | ICD-10-CM | POA: Diagnosis not present

## 2017-07-27 DIAGNOSIS — N182 Chronic kidney disease, stage 2 (mild): Secondary | ICD-10-CM | POA: Diagnosis present

## 2017-07-27 DIAGNOSIS — I161 Hypertensive emergency: Secondary | ICD-10-CM | POA: Diagnosis not present

## 2017-07-27 DIAGNOSIS — R402362 Coma scale, best motor response, obeys commands, at arrival to emergency department: Secondary | ICD-10-CM | POA: Diagnosis present

## 2017-07-27 DIAGNOSIS — E785 Hyperlipidemia, unspecified: Secondary | ICD-10-CM | POA: Diagnosis present

## 2017-07-27 DIAGNOSIS — I639 Cerebral infarction, unspecified: Secondary | ICD-10-CM | POA: Diagnosis not present

## 2017-07-27 DIAGNOSIS — Z9114 Patient's other noncompliance with medication regimen: Secondary | ICD-10-CM | POA: Diagnosis not present

## 2017-07-27 DIAGNOSIS — Z8249 Family history of ischemic heart disease and other diseases of the circulatory system: Secondary | ICD-10-CM

## 2017-07-27 DIAGNOSIS — R402142 Coma scale, eyes open, spontaneous, at arrival to emergency department: Secondary | ICD-10-CM | POA: Diagnosis not present

## 2017-07-27 DIAGNOSIS — I160 Hypertensive urgency: Secondary | ICD-10-CM | POA: Diagnosis not present

## 2017-07-27 DIAGNOSIS — G464 Cerebellar stroke syndrome: Secondary | ICD-10-CM | POA: Diagnosis not present

## 2017-07-27 DIAGNOSIS — I361 Nonrheumatic tricuspid (valve) insufficiency: Secondary | ICD-10-CM | POA: Diagnosis not present

## 2017-07-27 DIAGNOSIS — Z599 Problem related to housing and economic circumstances, unspecified: Secondary | ICD-10-CM

## 2017-07-27 DIAGNOSIS — I63233 Cerebral infarction due to unspecified occlusion or stenosis of bilateral carotid arteries: Secondary | ICD-10-CM | POA: Diagnosis not present

## 2017-07-27 LAB — CBC
HCT: 39.4 % (ref 35.0–47.0)
HEMOGLOBIN: 13.1 g/dL (ref 12.0–16.0)
MCH: 29.4 pg (ref 26.0–34.0)
MCHC: 33.2 g/dL (ref 32.0–36.0)
MCV: 88.5 fL (ref 80.0–100.0)
Platelets: 169 10*3/uL (ref 150–440)
RBC: 4.45 MIL/uL (ref 3.80–5.20)
RDW: 13.5 % (ref 11.5–14.5)
WBC: 6.2 10*3/uL (ref 3.6–11.0)

## 2017-07-27 LAB — BRAIN NATRIURETIC PEPTIDE: B Natriuretic Peptide: 73 pg/mL (ref 0.0–100.0)

## 2017-07-27 LAB — BASIC METABOLIC PANEL
ANION GAP: 11 (ref 5–15)
BUN: 21 mg/dL — ABNORMAL HIGH (ref 6–20)
CHLORIDE: 100 mmol/L — AB (ref 101–111)
CO2: 26 mmol/L (ref 22–32)
Calcium: 9.4 mg/dL (ref 8.9–10.3)
Creatinine, Ser: 0.99 mg/dL (ref 0.44–1.00)
GFR calc non Af Amer: 57 mL/min — ABNORMAL LOW (ref >60)
GLUCOSE: 285 mg/dL — AB (ref 65–99)
POTASSIUM: 4 mmol/L (ref 3.5–5.1)
Sodium: 137 mmol/L (ref 135–145)

## 2017-07-27 LAB — MRSA PCR SCREENING: MRSA by PCR: NEGATIVE

## 2017-07-27 LAB — TROPONIN I: Troponin I: <0.03 ng/mL (ref <0.03)

## 2017-07-27 LAB — GLUCOSE, CAPILLARY: GLUCOSE-CAPILLARY: 247 mg/dL — AB (ref 65–99)

## 2017-07-27 MED ORDER — ASPIRIN 81 MG PO CHEW
324.0000 mg | CHEWABLE_TABLET | Freq: Once | ORAL | Status: AC
  Administered 2017-07-27: 324 mg via ORAL
  Filled 2017-07-27: qty 4

## 2017-07-27 MED ORDER — CLEVIDIPINE BUTYRATE 0.5 MG/ML IV EMUL
0.0000 mg/h | INTRAVENOUS | Status: DC
  Filled 2017-07-27: qty 50

## 2017-07-27 MED ORDER — LINAGLIPTIN 5 MG PO TABS
5.0000 mg | ORAL_TABLET | Freq: Every day | ORAL | Status: DC
  Administered 2017-07-28: 5 mg via ORAL
  Filled 2017-07-27: qty 1

## 2017-07-27 MED ORDER — METFORMIN HCL 850 MG PO TABS
850.0000 mg | ORAL_TABLET | Freq: Two times a day (BID) | ORAL | Status: DC
  Administered 2017-07-27 – 2017-07-28 (×2): 850 mg via ORAL
  Filled 2017-07-27 (×2): qty 1

## 2017-07-27 MED ORDER — CLEVIDIPINE BUTYRATE 0.5 MG/ML IV EMUL
0.0000 mg/h | INTRAVENOUS | Status: DC
  Administered 2017-07-27 (×2): 1 mg/h via INTRAVENOUS
  Filled 2017-07-27 (×2): qty 50

## 2017-07-27 MED ORDER — ACETAMINOPHEN 650 MG RE SUPP: 650.0000 mg | Freq: Four times a day (QID) | RECTAL | Status: DC | PRN

## 2017-07-27 MED ORDER — NICARDIPINE HCL IN NACL 20-0.86 MG/200ML-% IV SOLN
3.0000 mg/h | Freq: Once | INTRAVENOUS | Status: DC
  Filled 2017-07-27: qty 200

## 2017-07-27 MED ORDER — GLIPIZIDE 5 MG PO TABS
5.0000 mg | ORAL_TABLET | Freq: Two times a day (BID) | ORAL | Status: DC
  Administered 2017-07-27 – 2017-07-28 (×2): 5 mg via ORAL
  Filled 2017-07-27 (×2): qty 1

## 2017-07-27 MED ORDER — ACETAMINOPHEN 325 MG PO TABS: 650.0000 mg | ORAL_TABLET | Freq: Four times a day (QID) | ORAL | Status: DC | PRN

## 2017-07-27 MED ORDER — ENOXAPARIN SODIUM 40 MG/0.4ML ~~LOC~~ SOLN
40.0000 mg | SUBCUTANEOUS | Status: DC
  Administered 2017-07-27 – 2017-07-28 (×2): 40 mg via SUBCUTANEOUS
  Filled 2017-07-27 (×2): qty 0.4

## 2017-07-27 MED ORDER — STROKE: EARLY STAGES OF RECOVERY BOOK
Freq: Once | Status: AC
  Administered 2017-07-28: 06:00:00

## 2017-07-27 MED ORDER — ATORVASTATIN CALCIUM 20 MG PO TABS
40.0000 mg | ORAL_TABLET | Freq: Every day | ORAL | Status: DC
  Administered 2017-07-27 – 2017-07-28 (×2): 40 mg via ORAL
  Filled 2017-07-27 (×2): qty 2

## 2017-07-27 NOTE — Consult Note (Signed)
Name: Julie Dominguez MRN: [id number] DOB: July 05, 1951    ADMISSION DATE:  07/27/2017  CONSULTATION DATE: 07/27/17  REFERRING MD :  Dr.Sainani  CHIEF COMPLAINT: Dizziness  BRIEF PATIENT DESCRIPTION: 66 year old female with Acute to subacute stroke  SIGNIFICANT EVENTS  8/28 Patient admitted to the ICU with Acute to Subacute stroke.  STUDIES:  01/20/17 ECHO>>   HISTORY OF PRESENT ILLNESS:  Julie Dominguez is 66 year old female with known history of  DM,Hypertension and Hyperlipidemia.  Patient was presented to ED on 8/28  With dizziness.  She has not been taking her BP medication for about 4 months. On 8/27 patient felt worse with unsteady gait. Therefore she presented to ED to get checked out.  Her SBP was noted to be in 280's and was found to have acute to subacute stroke in the right cerebellar area and old left occipital infarct.  PAST MEDICAL HISTORY :   has a past medical history of Diabetes mellitus (2007); Hyperlipidemia; and Hypertension (1988).  has a past surgical history that includes Eye surgery (Bilateral, August 2013, Oct 2013). Prior to Admission medications   Medication Sig Start Date End Date Taking? Authorizing Provider  glipiZIDE (GLUCOTROL) 5 MG tablet TAKE 1 TABLET BY MOUTH EVERY MORNING AND TAKE 2 TABLETS BY MOUTH EVERY EVENING BEFORE MEALS Patient taking differently: Take 5-10 mg by mouth 2 (two) times daily before a meal. 5 mg every morning and 10 mg every evening before meals 07/21/17  Yes Crecencio Mc, MD  metFORMIN (GLUCOPHAGE) 850 MG tablet TAKE 1 TABLET BY MOUTH TWICE A DAY WITH MEAL 07/21/17  Yes Crecencio Mc, MD  sitaGLIPtin (JANUVIA) 50 MG tablet Take 1 tablet (50 mg total) by mouth daily. 07/21/17  Yes Crecencio Mc, MD   No Known Allergies  FAMILY HISTORY:  family history includes Alzheimer's disease in her father; Cancer - Colon in her maternal aunt; Heart disease in her daughter; Hypertension in her mother; Stroke in her mother. SOCIAL  HISTORY:  reports that she has never smoked. She has never used smokeless tobacco. She reports that she does not drink alcohol or use drugs.  REVIEW OF SYSTEMS:   Constitutional: Negative for fever, chills, weight loss, malaise/fatigue and diaphoresis.  HENT: Negative for hearing loss, ear pain, nosebleeds, congestion, sore throat, neck pain, tinnitus and ear discharge.   Eyes: Negative for blurred vision, double vision, photophobia, pain, discharge and redness.  Respiratory: Negative for cough, hemoptysis, sputum production, shortness of breath, wheezing and stridor.   Cardiovascular: Negative for chest pain, palpitations, orthopnea, claudication, leg swelling and PND.  Gastrointestinal: Negative for heartburn, nausea, vomiting, abdominal pain, diarrhea, constipation, blood in stool and melena.  Genitourinary: Negative for dysuria, urgency, frequency, hematuria and flank pain.  Musculoskeletal: Negative for myalgias, back pain, joint pain and falls.  Skin: Negative for itching and rash.  Neurological: Negative for dizziness, tingling, tremors, sensory change, speech change, focal weakness, seizures, loss of consciousness, weakness and headaches.  Endo/Heme/Allergies: Negative for environmental allergies and polydipsia. Does not bruise/bleed easily.  SUBJECTIVE: Patient states that "she feels alright".  VITAL SIGNS: Temp:  [98 F (36.7 C)-98.8 F (37.1 C)] 98 F (36.7 C) (08/28 2000) Pulse Rate:  [83-114] 88 (08/28 2030) Resp:  [15-30] 17 (08/28 2030) BP: (164-280)/(85-156) 205/101 (08/28 2030) SpO2:  [96 %-100 %] 97 % (08/28 2030) Weight:  [50.8 kg (112 lb)-54 kg (119 lb 0.8 oz)] 54 kg (119 lb 0.8 oz) (08/28 1656)  PHYSICAL EXAMINATION: General:  66  year old female in no acute distress Neuro:  Awake,Alert and oriented, no facial droop,no slurred speech,Gait not checked HEENT:  AT,[place],No JVD Cardiovascular:  S1S2,Regular,no M/R/G Lungs: clear bilaterally,no  wheezes,crackles,rhonchi Abdomen: soft,NT,ND Musculoskeletal:  5/5 muscle strength. Skin:  Warm,dry and Intact   Recent Labs Lab 07/27/17 1258  NA 137  K 4.0  CL 100*  CO2 26  BUN 21*  CREATININE 0.99  GLUCOSE 285*    Recent Labs Lab 07/27/17 1258  HGB 13.1  HCT 39.4  WBC 6.2  PLT 169   Ct Head Wo Contrast  Result Date: 07/27/2017 CLINICAL DATA:  Dizziness, ataxia EXAM: CT HEAD WITHOUT CONTRAST TECHNIQUE: Contiguous axial images were obtained from the base of the skull through the vertex without intravenous contrast. COMPARISON:  None. FINDINGS: Brain: Low-density throughout the right cerebellar hemisphere concerning for acute to subacute infarction. No hemorrhage. Old left occipital infarct. Diffuse cerebral atrophy. No hydrocephalus. Vascular: No hyperdense vessel or unexpected calcification. Skull: No acute calvarial abnormality. Sinuses/Orbits: Visualized paranasal sinuses and mastoids clear. Orbital soft tissues unremarkable. Other: None IMPRESSION: Findings concerning for acute to subacute infarcts in the right cerebellar hemisphere. Old left occipital infarct. Diffuse cerebral atrophy. Electronically Signed   By: Rolm Baptise M.D.   On: 07/27/2017 15:05    ASSESSMENT / PLAN: Acute to subacute stroke Accelerated Hypertension Type 2 DM Hyperlipidemia   Plan Continue Aspirin daily Obtain MRI of the brain F/u Carotid ultrasound  F/U ECHO Continue Cleviprex, with permissive hypertension with goal SBP 200 mm of Hg Continue Lipitor Continue Neuro checks  Continue glipizide,metformin,Linagliptin     [name],AG-ACNP Pulmonary and [place]   07/27/2017, 8:59 PM

## 2017-07-27 NOTE — ED Notes (Addendum)
Per Dr. Leslye Peer, admitting MD he wants patient's systolic BP at 589.

## 2017-07-27 NOTE — Telephone Encounter (Signed)
LMTCB

## 2017-07-27 NOTE — ED Triage Notes (Signed)
FIRST NURSE NOTE-has been feeling dizzy for couple days. Worse when moves around.

## 2017-07-27 NOTE — H&P (Signed)
Julie Dominguez NAME: Julie Dominguez    MR#:  [id number]  DATE OF BIRTH:  [birth year]  DATE OF ADMISSION:  07/27/2017  PRIMARY CARE PHYSICIAN: Crecencio Mc, MD   REQUESTING/REFERRING PHYSICIAN: Dr. Cephas Darby  CHIEF COMPLAINT:   Chief Complaint  Patient presents with  . Dizziness    HISTORY OF PRESENT ILLNESS:  Julie Dominguez  is a 66 y.o. female presents with dizziness. She states that she has not taken her blood pressure medication in about 4 months. Yesterday she felt a little bit worse and unsteady while walking around. Her left eye upper vision she is able to see a little dark area. She wanted to be checked out today to see if anything was going on. In the ER, her blood pressure was found to be in the 923R systolic and she was found to have acute to subacute stroke in the right cerebellar area and an old left acceptable infarct.  PAST MEDICAL HISTORY:   Past Medical History:  Diagnosis Date  . Diabetes mellitus 2007  . Hyperlipidemia   . Hypertension 1988   uncontrolled     PAST SURGICAL HISTORY:   Past Surgical History:  Procedure Laterality Date  . EYE SURGERY Bilateral August 2013, Oct 2013   catraract with lens implant dingledein     SOCIAL HISTORY:   Social History  Substance Use Topics  . Smoking status: Never Smoker  . Smokeless tobacco: Never Used  . Alcohol use No    FAMILY HISTORY:   Family History  Problem Relation Age of Onset  . Stroke Mother   . Hypertension Mother   . Heart disease Daughter   . Alzheimer's disease Father   . Cancer - Colon Maternal Aunt   . Breast cancer Neg Hx     DRUG ALLERGIES:  No Known Allergies  REVIEW OF SYSTEMS:  CONSTITUTIONAL: No fever, fatigue or weakness. Positive for weight loss 15-20 pounds EYES: No blurred or double vision. Left eyeleft upper area she sees a dark area EARS, NOSE, AND THROAT: No tinnitus or ear pain. No sore  throat RESPIRATORY: No cough, shortness of breath, wheezing or hemoptysis.  CARDIOVASCULAR: No chest pain, orthopnea, edema.  GASTROINTESTINAL: No nausea, vomiting, diarrhea or abdominal pain. No blood in bowel movements GENITOURINARY: No dysuria, hematuria.  ENDOCRINE: No polyuria, nocturia,  HEMATOLOGY: No anemia, easy bruising or bleeding SKIN: No rash or lesion.positive for itching MUSCULOSKELETAL: No joint pain or arthritis.  Positive for hip stiffness bilaterally NEUROLOGIC: No tingling, numbness, weakness.  PSYCHIATRY: No anxiety or depression.   MEDICATIONS AT HOME:   Prior to Admission medications   Medication Sig Start Date End Date Taking? Authorizing Provider  amLODipine (NORVASC) 10 MG tablet TAKE 1 TABLET (10 MG TOTAL) BY MOUTH DAILY. 09/23/15   Crecencio Mc, MD  atorvastatin (LIPITOR) 40 MG tablet Take 0.5 tablets (20 mg total) by mouth daily. 07/30/16   Crecencio Mc, MD  glipiZIDE (GLUCOTROL) 10 MG tablet TAKE 1 TABLET BY MOUTH TWICE A DAY BEFORE MEALS 09/26/15   Crecencio Mc, MD  glipiZIDE (GLUCOTROL) 5 MG tablet TAKE 1 TABLET BY MOUTH EVERY MORNING AND TAKE 2 TABLETS BY MOUTH EVERY EVENING BEFORE MEALS 07/21/17   Crecencio Mc, MD  hydrochlorothiazide (HYDRODIURIL) 25 MG tablet Take 1 tablet (25 mg total) by mouth daily. 09/23/15   Crecencio Mc, MD  INS SYRINGE/NEEDLE 1CC/28G (B-D INSULIN SYRINGE 1CC/28G) 28G X 1/2" 1 ML MISC 1  Syringe by Does not apply route 2 (two) times daily. 03/14/14   Crecencio Mc, MD  isosorbide-hydrALAZINE (BIDIL) 20-37.5 MG tablet Take 1 tablet by mouth 3 (three) times daily. 01/26/17   Wende Bushy, MD  lisinopril (PRINIVIL,ZESTRIL) 40 MG tablet TAKE 1 TABLET (40 MG TOTAL) BY MOUTH DAILY.... DOSE CHANGE 09/17/16   Crecencio Mc, MD  losartan (COZAAR) 100 MG tablet TAKE 1 TABLET BY MOUTH EVERY DAY 08/18/16   Crecencio Mc, MD  metFORMIN (GLUCOPHAGE) 850 MG tablet TAKE 1 TABLET BY MOUTH TWICE A DAY WITH MEAL 07/21/17   Crecencio Mc, MD  metoprolol succinate (TOPROL-XL) 50 MG 24 hr tablet TAKE 1 TABLET (50 MG TOTAL) BY MOUTH 2 (TWO) TIMES DAILY. 07/22/16   Crecencio Mc, MD  NOVOLIN N 100 UNIT/ML injection TAKE 20 UNITS AT BEDTIME AND 12 UNITS AT BREAKFAST 05/05/16   Crecencio Mc, MD  ONE TOUCH ULTRA TEST test strip TEST SUGAR TWICE A DAY 01/31/14   Crecencio Mc, MD  sitaGLIPtin (JANUVIA) 50 MG tablet Take 1 tablet (50 mg total) by mouth daily. 07/21/17   Crecencio Mc, MD    patient not taking any of her medications for blood pressure. She is taking the diabetic oral medications. Medication reconciliation still undergoing.  VITAL SIGNS:  Blood pressure (!) 246/106, pulse 85, temperature 98.8 F (37.1 C), temperature source Oral, resp. rate 18, height 5\' 3"  (1.6 m), weight 50.8 kg (112 lb), SpO2 98 %.  PHYSICAL EXAMINATION:  GENERAL:  65 y.o.-year-old patient lying in the bed with no acute distress.  EYES: Pupils equal, round, reactive to light and accommodation. No scleral icterus. Extraocular muscles intact.  HEENT: Head atraumatic, normocephalic. Oropharynx and nasopharynx clear.  NECK:  Supple, no jugular venous distention. No thyroid enlargement, no tenderness.  LUNGS: Normal breath sounds bilaterally, no wheezing, rales,rhonchi or crepitation. No use of accessory muscles of respiration.  CARDIOVASCULAR: S1, S2 normal. No murmurs, rubs, or gallops.  ABDOMEN: Soft, nontender, nondistended. Bowel sounds present. No organomegaly or mass.  EXTREMITIES: No pedal edema, cyanosis, or clubbing.  NEUROLOGIC: Cranial nerves II through XII are intact. Muscle strength 5/5 in all extremities. Sensation intact. Gait not checked. Babinski negative bilaterally. Coordination with finger-nose-finger and heel shin intact bilaterally PSYCHIATRIC: The patient is alert and oriented x 3.  SKIN: No rash, lesion, or ulcer.   LABORATORY PANEL:   CBC  Recent Labs Lab 07/27/17 1258  WBC 6.2  HGB 13.1  HCT 39.4  PLT 169    ------------------------------------------------------------------------------------------------------------------  Chemistries   Recent Labs Lab 07/27/17 1258  NA 137  K 4.0  CL 100*  CO2 26  GLUCOSE 285*  BUN 21*  CREATININE 0.99  CALCIUM 9.4   ------------------------------------------------------------------------------------------------------------------  Cardiac Enzymes  Recent Labs Lab 07/27/17 1258  TROPONINI <0.03   ------------------------------------------------------------------------------------------------------------------  RADIOLOGY:  Ct Head Wo Contrast  Result Date: 07/27/2017 CLINICAL DATA:  Dizziness, ataxia EXAM: CT HEAD WITHOUT CONTRAST TECHNIQUE: Contiguous axial images were obtained from the base of the skull through the vertex without intravenous contrast. COMPARISON:  None. FINDINGS: Brain: Low-density throughout the right cerebellar hemisphere concerning for acute to subacute infarction. No hemorrhage. Old left occipital infarct. Diffuse cerebral atrophy. No hydrocephalus. Vascular: No hyperdense vessel or unexpected calcification. Skull: No acute calvarial abnormality. Sinuses/Orbits: Visualized paranasal sinuses and mastoids clear. Orbital soft tissues unremarkable. Other: None IMPRESSION: Findings concerning for acute to subacute infarcts in the right cerebellar hemisphere. Old left occipital infarct. Diffuse cerebral atrophy. Electronically Signed  By: Rolm Baptise M.D.   On: 07/27/2017 15:05    EKG:   Sinus rhythm 84 bpm  IMPRESSION AND PLAN:   1. Acute to subacute stroke. Aspirin 325 mg daily. Obtain an MRI of the brain, carotid ultrasound and echocardiogram. Neurology, PT OT and speech therapy consultations. 2. Accelerated hypertension. With the acute stroke need to allow permissive hypertension. Goal blood pressure today is systolic 518. Started on cleviprex drip. 3.type 2 diabetes mellitus. Check hemoglobin A1c. Start on sliding scale  and continue oral medications 4. Hyperlipidemia unspecified increase Lipitor to 40 mg daily and check a lipid profile tomorrow morning  All the records are reviewed and case discussed with ED provider. Management plans discussed with the patient, family and they are in agreement.  CODE STATUS: full code  TOTAL TIME TAKING CARE OF THIS PATIENT: 50 minutes, patient admitted to the critical care unit   Milas Hock.D on 07/27/2017 at 4:03 PM  Between 7am to 6pm - Pager - [phone]  After 6pm call admission pager [phone]  [facility]  [phone]  CC: Primary care physician; Crecencio Mc, MD

## 2017-07-27 NOTE — ED Notes (Signed)
Pt answering screening questions for [facility], MRI.

## 2017-07-27 NOTE — ED Notes (Signed)
Pt getting changed into gown.

## 2017-07-27 NOTE — ED Provider Notes (Signed)
[facility] Emergency Department Provider Note       Time seen: ----------------------------------------- 1:34 PM on 07/27/2017 -----------------------------------------     I have reviewed the triage vital signs and the nursing notes.   HISTORY   Chief Complaint Dizziness    HPI Julie Dominguez is a 66 y.o. female who presents to the ED for dizziness for 3-4 days. Patient has an important with her primary care doctor tomorrow to get her blood pressure medicines. Patient states she's not taking her medicines about 3 months. Dizziness seems to be worse when she is moving around, she also has describes some visual changes. She states she is taking medication now for her diabetes but had run out of that as well. She denies fevers, chills or other complaints.   Past Medical History:  Diagnosis Date  . Diabetes mellitus 2007  . Hyperlipidemia   . Hypertension 1988   uncontrolled     Patient Active Problem List   Diagnosis Date Noted  . 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 Abnormal mammogram of left breast 10/02/2014  . Special screening for malignant neoplasms, colon 02/21/2014  . Screening for breast cancer 03/02/2013  . Screening for colon cancer 03/02/2013  . Screening for cervical cancer 03/02/2013  . GAD (generalized anxiety disorder) 05/08/2012  . Cataract associated with another disorder 05/08/2012  . Hypertension   . Hyperlipidemia     Past Surgical History:  Procedure Laterality Date  . EYE SURGERY Bilateral August 2013, Oct 2013   catraract with lens implant dingledein     Allergies Patient has no known allergies.  Social History Social History  Substance Use Topics  . Smoking status: Never Smoker  . Smokeless tobacco: Never Used  . Alcohol use No    Review of Systems Constitutional: Negative for fever. Eyes: positive for vision changes ENT:  Negative for congestion,  sore throat Cardiovascular: Negative for chest pain. Respiratory: Negative for shortness of breath. Gastrointestinal: Negative for abdominal pain, vomiting and diarrhea. Genitourinary: Negative for dysuria. Musculoskeletal: Negative for back pain. Skin: Negative for rash. Neurological: Negative for headaches, focal weakness or numbness.positive for dizziness  All systems negative/normal/unremarkable except as stated in the HPI  ____________________________________________   PHYSICAL EXAM:  VITAL SIGNS: ED Triage Vitals  Enc Vitals Group     BP 07/27/17 1259 (!) 280/120     Pulse Rate 07/27/17 1259 83     Resp 07/27/17 1259 16     Temp 07/27/17 1259 98.8 F (37.1 C)     Temp Source 07/27/17 1259 Oral     SpO2 07/27/17 1259 98 %     Weight 07/27/17 1257 112 lb (50.8 kg)     Height 07/27/17 1257 5\' 3"  (1.6 m)     Head Circumference --      Peak Flow --      Pain Score 07/27/17 1257 0     Pain Loc --      Pain Edu? --      Excl. in [place]? --     Constitutional: Alert and oriented. Well appearing and in no distress. Eyes: Conjunctivae are normal. Normal extraocular movements. ENT   Head: Normocephalic and atraumatic.   Nose: No congestion/rhinnorhea.   Mouth/Throat: Mucous membranes are moist.   Neck: No stridor. Cardiovascular: Normal rate, regular rhythm. Split S2 Respiratory: Normal respiratory effort without tachypnea nor retractions. Breath sounds are clear and equal bilaterally. No wheezes/rales/rhonchi. Gastrointestinal: Soft and nontender. Normal bowel  sounds Musculoskeletal: Nontender with normal range of motion in extremities. No lower extremity tenderness nor edema. Neurologic:  Normal speech and language. No gross focal neurologic deficits are appreciated. Strength, sensation, cranial nerves. Normal, normal finger to nose testing. Skin:  Skin is warm, dry and intact. No rash noted. Psychiatric: Mood and affect are normal. Speech and behavior are  normal.  ____________________________________________  EKG: Interpreted by me.sinus rhythm rate 84 bpm, normal QRS, normal QT, normal axis.  ____________________________________________  ED COURSE:  Pertinent labs & imaging results that were available during my care of the patient were reviewed by me and considered in my medical decision making (see chart for details). Patient presents for dizziness and is noted to have severe hypertension, we will assess with labs and imaging an order IV blood pressure medication.   Procedures ____________________________________________   LABS (pertinent positives/negatives)  Labs Reviewed  BASIC METABOLIC PANEL - Abnormal; Notable for the following:       Result Value   Chloride 100 (*)    Glucose, Bld 285 (*)    BUN 21 (*)    GFR calc non Af Amer 57 (*)    All other components within normal limits  CBC  TROPONIN I  BRAIN NATRIURETIC PEPTIDE  URINALYSIS, COMPLETE (UACMP) WITH MICROSCOPIC  CBG MONITORING, ED   CRITICAL CARE Performed by: Earleen Newport   Total critical care time: 30 minutes  Critical care time was exclusive of separately billable procedures and treating other patients.  Critical care was necessary to treat or prevent imminent or life-threatening deterioration.  Critical care was time spent personally by me on the following activities: development of treatment plan with patient and/or surrogate as well as nursing, discussions with consultants, evaluation of patient's response to treatment, examination of patient, obtaining history from patient or surrogate, ordering and performing treatments and interventions, ordering and review of laboratory studies, ordering and review of radiographic studies, pulse oximetry and re-evaluation of patient's condition.  RADIOLOGY Images were viewed by me  IMPRESSION: Findings concerning for acute to subacute infarcts in the right cerebellar hemisphere.  Old left occipital  infarct.  Diffuse cerebral atrophy.  ____________________________________________  FINAL ASSESSMENT AND PLAN  Cerebellar infarct, hypertensive emergency chronic hypertension, medication noncompliance  Plan: Patient's labs and imaging were dictated above. Patient had presented for dizziness that has progressed over the past several days. Currently she has no neurologic complaints other than feeling slightly off balance. She has been off her medications for several months due to financial reasons. 3 months ago her blood pressure was 031 systolic and she was given a short supply of clonidine. Here we'll place her on a nicardipine drip and give her oral aspirin.   Earleen Newport, MD   Note: This note was generated in part or whole with voice recognition software. Voice recognition is usually quite accurate but there are transcription errors that can and very often do occur. I apologize for any typographical errors that were not detected and corrected.     Earleen Newport, MD 07/27/17 [phone]

## 2017-07-27 NOTE — Progress Notes (Signed)
Pt to MRI at 2115 and returned at 2200.  Pt blood pressure monitored closely.  Marguarite Arbour, RN

## 2017-07-27 NOTE — ED Triage Notes (Addendum)
C/O dizziness x 3-4 days.  Patient has appointment with PCP tomorrow to get BP medicine.  States has not taken meds x 3 months.

## 2017-07-27 NOTE — Progress Notes (Signed)
[place] received OR to pray with PT. [place] reported to ICU-7 and meet PT and her fa   07/27/17 1740  Clinical Encounter Type  Visited With Patient and family together  Visit Type Initial  Referral From Nurse  Consult/Referral To Chaplain  Spiritual Encounters  Spiritual Needs Prayer;Emotional  mily. [place] prayed with PT.

## 2017-07-28 ENCOUNTER — Inpatient Hospital Stay (HOSPITAL_COMMUNITY)
Admit: 2017-07-28 | Discharge: 2017-07-28 | Disposition: A | Discharge status: Home / Self Care | Payer: Medicare HMO | Attending: Internal Medicine | Admitting: Internal Medicine

## 2017-07-28 ENCOUNTER — Ambulatory Visit: Payer: Medicare HMO | Admitting: Internal Medicine

## 2017-07-28 DIAGNOSIS — I161 Hypertensive emergency: Secondary | ICD-10-CM

## 2017-07-28 DIAGNOSIS — I639 Cerebral infarction, unspecified: Secondary | ICD-10-CM

## 2017-07-28 DIAGNOSIS — I361 Nonrheumatic tricuspid (valve) insufficiency: Secondary | ICD-10-CM

## 2017-07-28 DIAGNOSIS — I160 Hypertensive urgency: Secondary | ICD-10-CM

## 2017-07-28 LAB — LIPID PANEL
CHOLESTEROL: 289 mg/dL — AB (ref 0–200)
HDL: 50 mg/dL (ref >40)
LDL Cholesterol: 212 mg/dL — ABNORMAL HIGH (ref 0–99)
TRIGLYCERIDES: 135 mg/dL (ref <150)
Total CHOL/HDL Ratio: 5.8 RATIO
VLDL: 27 mg/dL (ref 0–40)

## 2017-07-28 LAB — BASIC METABOLIC PANEL
Anion gap: 9 (ref 5–15)
BUN: 26 mg/dL — ABNORMAL HIGH (ref 6–20)
CALCIUM: 8.8 mg/dL — AB (ref 8.9–10.3)
CHLORIDE: 100 mmol/L — AB (ref 101–111)
CO2: 27 mmol/L (ref 22–32)
CREATININE: 1.03 mg/dL — AB (ref 0.44–1.00)
GFR calc Af Amer: >60 mL/min (ref >60)
GFR calc non Af Amer: 55 mL/min — ABNORMAL LOW (ref >60)
Glucose, Bld: 256 mg/dL — ABNORMAL HIGH (ref 65–99)
POTASSIUM: 4 mmol/L (ref 3.5–5.1)
SODIUM: 136 mmol/L (ref 135–145)

## 2017-07-28 LAB — CBC
HCT: 38.4 % (ref 35.0–47.0)
Hemoglobin: 12.8 g/dL (ref 12.0–16.0)
MCH: 29.7 pg (ref 26.0–34.0)
MCHC: 33.4 g/dL (ref 32.0–36.0)
MCV: 88.9 fL (ref 80.0–100.0)
PLATELETS: 151 10*3/uL (ref 150–440)
RBC: 4.32 MIL/uL (ref 3.80–5.20)
RDW: 13.7 % (ref 11.5–14.5)
WBC: 5.6 10*3/uL (ref 3.6–11.0)

## 2017-07-28 LAB — GLUCOSE, CAPILLARY
GLUCOSE-CAPILLARY: 273 mg/dL — AB (ref 65–99)
Glucose-Capillary: 198 mg/dL — ABNORMAL HIGH (ref 65–99)
Glucose-Capillary: 115 mg/dL — ABNORMAL HIGH (ref 65–99)

## 2017-07-28 LAB — HEMOGLOBIN A1C
Hgb A1c MFr Bld: 14.3 % — ABNORMAL HIGH (ref 4.8–5.6)
MEAN PLASMA GLUCOSE: 363.71 mg/dL

## 2017-07-28 LAB — ECHOCARDIOGRAM COMPLETE
Height: 63 in
Weight: 1904.77 oz

## 2017-07-28 MED ORDER — HYDRALAZINE HCL 20 MG/ML IJ SOLN
INTRAMUSCULAR | Status: AC
  Administered 2017-07-28: 20 mg
  Filled 2017-07-28: qty 1

## 2017-07-28 MED ORDER — SODIUM CHLORIDE 0.9 % IV BOLUS (SEPSIS)
500.0000 mL | Freq: Once | INTRAVENOUS | Status: AC
  Administered 2017-07-28: 500 mL via INTRAVENOUS

## 2017-07-28 MED ORDER — INSULIN ASPART 100 UNIT/ML ~~LOC~~ SOLN
0.0000 [IU] | Freq: Three times a day (TID) | SUBCUTANEOUS | Status: DC
  Administered 2017-07-28: 3 [IU] via SUBCUTANEOUS
  Administered 2017-07-29 (×2): 5 [IU] via SUBCUTANEOUS
  Filled 2017-07-28 (×3): qty 1

## 2017-07-28 MED ORDER — HYDRALAZINE HCL 20 MG/ML IJ SOLN
10.0000 mg | INTRAMUSCULAR | Status: DC | PRN
  Administered 2017-07-29: 03:00:00 20 mg via INTRAVENOUS
  Filled 2017-07-28: qty 1

## 2017-07-28 MED ORDER — ADULT MULTIVITAMIN W/MINERALS CH
1.0000 | ORAL_TABLET | Freq: Every day | ORAL | Status: DC
  Administered 2017-07-28 – 2017-07-29 (×2): 1 via ORAL
  Filled 2017-07-28 (×2): qty 1

## 2017-07-28 MED ORDER — AMLODIPINE BESYLATE 10 MG PO TABS
10.0000 mg | ORAL_TABLET | Freq: Every day | ORAL | Status: DC
  Administered 2017-07-29: 10 mg via ORAL
  Filled 2017-07-28: qty 1

## 2017-07-28 MED ORDER — HYDRALAZINE HCL 20 MG/ML IJ SOLN: 10.0000 mg | INTRAMUSCULAR | Status: DC | PRN

## 2017-07-28 MED ORDER — HYDRALAZINE HCL 50 MG PO TABS: 25.0000 mg | ORAL_TABLET | Freq: Four times a day (QID) | ORAL | Status: DC | PRN

## 2017-07-28 MED ORDER — GLUCERNA SHAKE PO LIQD
237.0000 mL | Freq: Three times a day (TID) | ORAL | Status: DC
  Administered 2017-07-28 – 2017-07-29 (×3): 237 mL via ORAL

## 2017-07-28 MED ORDER — INSULIN DETEMIR 100 UNIT/ML ~~LOC~~ SOLN
10.0000 [IU] | Freq: Two times a day (BID) | SUBCUTANEOUS | Status: DC
  Administered 2017-07-28 – 2017-07-29 (×3): 10 [IU] via SUBCUTANEOUS
  Filled 2017-07-28 (×4): qty 0.1

## 2017-07-28 MED ORDER — CLONIDINE HCL 0.1 MG PO TABS
0.1000 mg | ORAL_TABLET | Freq: Two times a day (BID) | ORAL | Status: DC
  Administered 2017-07-28 – 2017-07-29 (×2): 0.1 mg via ORAL
  Filled 2017-07-28 (×2): qty 1

## 2017-07-28 MED ORDER — CLONIDINE HCL 0.1 MG PO TABS
0.1000 mg | ORAL_TABLET | Freq: Every day | ORAL | Status: DC
  Administered 2017-07-28: 0.1 mg via ORAL
  Filled 2017-07-28: qty 1

## 2017-07-28 MED ORDER — INSULIN ASPART 100 UNIT/ML ~~LOC~~ SOLN
0.0000 [IU] | Freq: Every day | SUBCUTANEOUS | Status: DC
  Administered 2017-07-28: 3 [IU] via SUBCUTANEOUS
  Filled 2017-07-28: qty 1

## 2017-07-28 NOTE — Evaluation (Signed)
Occupational Therapy Evaluation Patient Details Name: Julie Dominguez MRN: [id number] DOB: [birth year] Today's Date: 07/28/2017    History of Present Illness Pt admitted for hypertensive urgercy. Pt now with positive acute on subacute R occipital lobe and B cerebellar infarcts. History includes L PCA and ACA CVA, HTN, and DM. Pt with complaints of dizziness after not taking BP meds in 4 months. Elevated BP upon admission, has decreased to 130/62 at this time.   Clinical Impression   Patient was seen for OT evaluation this date and appears to be at her baseline level of independence.  She is able to demonstrate the ability to perform dressing skills, no deficits in strength, ROM or coordination at this time.  No pain reported.  She has a niece who lives next door who is helping to watch her cats while she is in the hospital.  She is aware of the fact she should have been taking her medication and she reports awareness of how and when to take it but it was more of a financial hardship.  She reports the Education officer, museum and pharmacist have helped with finding the lowest costs and she is planning to be compliant in the future.  No further OT needs at this time.     Follow Up Recommendations  No OT follow up    Equipment Recommendations       Recommendations for Other Services       Precautions / Restrictions Precautions Precautions: Fall Restrictions Weight Bearing Restrictions: No      Mobility Bed Mobility Overal bed mobility: Independent             General bed mobility comments: safe technique, slightly impulsive, however able to respond to cues to wait for therapist  Transfers Overall transfer level: Independent Equipment used: None             General transfer comment: safe technique performed with upright posture. No RW used    Balance Overall balance assessment: Independent                                         ADL either performed or  assessed with clinical judgement   ADL Overall ADL's : At baseline                                       General ADL Comments: Prior to admission patient was independent with self care and IADL tasks.  She was also still driving but is retired. Patient admits she was not taking her medication, she knows what to take and when but felt she did not have the finances to purchase it monthly.       Vision Baseline Vision/History: No visual deficits Patient Visual Report: No change from baseline       Perception     Praxis      Pertinent Vitals/Pain Pain Assessment: 0-10 Pain Score: 0-No pain     Hand Dominance Right   Extremity/Trunk Assessment Upper Extremity Assessment Upper Extremity Assessment: Overall WFL for tasks assessed   Lower Extremity Assessment Lower Extremity Assessment: Overall WFL for tasks assessed   Cervical / Trunk Assessment Cervical / Trunk Assessment: Normal   Communication Communication Communication: No difficulties   Cognition Arousal/Alertness: Awake/alert Behavior During Therapy: WFL for tasks assessed/performed Overall Cognitive Status:  Within Functional Limits for tasks assessed                                     General Comments       Exercises     Shoulder Instructions      Home Living Family/patient expects to be discharged to:: Private residence Living Arrangements: Alone Available Help at Discharge: Family Type of Home: House Home Access: Level entry     [place]: One level     Bathroom Shower/Tub: Corporate investment banker: Standard Bathroom Accessibility: Yes   Home Equipment: Tub bench          Prior Functioning/Environment Level of Independence: Independent        Comments: Pt very independent with all ADLs prior to admission        OT Problem List:        OT Treatment/Interventions:      OT Goals(Current goals can be found in the care plan section)  Acute Rehab OT Goals Patient Stated Goal: to go home OT Goal Formulation: With patient Time For Goal Achievement: 08/06/17 Potential to Achieve Goals: Good  OT Frequency:     Barriers to D/C:            Co-evaluation              AM-PAC PT "6 Clicks" Daily Activity     Outcome Measure Help from another person eating meals?: None Help from another person taking care of personal grooming?: None Help from another person toileting, which includes using toliet, bedpan, or urinal?: None Help from another person bathing (including washing, rinsing, drying)?: None Help from another person to put on and taking off regular upper body clothing?: None Help from another person to put on and taking off regular lower body clothing?: None 6 Click Score: 24   End of Session    Activity Tolerance: Patient tolerated treatment well Patient left: in chair;with call bell/phone within reach  OT Visit Diagnosis: Muscle weakness (generalized) (M62.81)                Time: 1610-9604 OT Time Calculation (min): 13 min Charges:  OT General Charges $OT Visit: 1 Visit OT Evaluation $OT Eval Low Complexity: 1 Low G-Codes: OT G-codes **NOT FOR INPATIENT CLASS** Functional Assessment Tool Used: AM-PAC 6 Clicks Daily Activity Functional Limitation: Self care Self Care Current Status (V4098): 0 percent impaired, limited or restricted Self Care Goal Status (J1914): 0 percent impaired, limited or restricted Self Care Discharge Status (N8295): 0 percent impaired, limited or restricted   Amy T Lovett, OTR/L, CLT   Lovett,Amy 07/28/2017, 3:21 PM

## 2017-07-28 NOTE — Progress Notes (Signed)
[place] [facility] [place] NAME: Julie Dominguez    MR#:  [id number]  DATE OF BIRTH:  17-May-1951  SUBJECTIVE:   Patient here due to dizziness and noted to have accelerated/malignant hypertension and CT scan of the head showing acute/subacute CVA. Patient says she feels better today. No weakness, numbness, headache, nausea, vomiting. Blood pressures have improved but are still labile.  REVIEW OF SYSTEMS:    Review of Systems  Constitutional: Negative for chills and fever.  HENT: Negative for congestion and tinnitus.   Eyes: Negative for blurred vision and double vision.  Respiratory: Negative for cough, shortness of breath and wheezing.   Cardiovascular: Negative for chest pain, orthopnea and PND.  Gastrointestinal: Negative for abdominal pain, diarrhea, nausea and vomiting.  Genitourinary: Negative for dysuria and hematuria.  Neurological: Negative for dizziness, sensory change and focal weakness.  All other systems reviewed and are negative.   Nutrition: Heart Healthy/Carb control.  Tolerating Diet: Yes Tolerating PT: Await Eval.   DRUG ALLERGIES:  No Known Allergies  VITALS:  Blood pressure 134/62, pulse 72, temperature 98.4 F (36.9 C), temperature source Oral, resp. rate (!) 24, height 5\' 3"  (1.6 m), weight 54 kg (119 lb 0.8 oz), SpO2 95 %.  PHYSICAL EXAMINATION:   Physical Exam  GENERAL:  66 y.o.-year-old patient lying in bed in no acute distress.  EYES: Pupils equal, round, reactive to light and accommodation. No scleral icterus. Extraocular muscles intact.  HEENT: Head atraumatic, normocephalic. Oropharynx and nasopharynx clear.  NECK:  Supple, no jugular venous distention. No thyroid enlargement, no tenderness.  LUNGS: Normal breath sounds bilaterally, no wheezing, rales, rhonchi. No use of accessory muscles of respiration.  CARDIOVASCULAR: S1, S2 normal. No murmurs, rubs, or gallops.  ABDOMEN: Soft, nontender, nondistended. Bowel  sounds present. No organomegaly or mass.  EXTREMITIES: No cyanosis, clubbing or edema b/l.    NEUROLOGIC: Cranial nerves II through XII are intact. No focal Motor or sensory deficits b/l.   PSYCHIATRIC: The patient is alert and oriented x 3.  SKIN: No obvious rash, lesion, or ulcer.    LABORATORY PANEL:   CBC  Recent Labs Lab 07/28/17 0559  WBC 5.6  HGB 12.8  HCT 38.4  PLT 151   ------------------------------------------------------------------------------------------------------------------  Chemistries   Recent Labs Lab 07/28/17 0559  NA 136  K 4.0  CL 100*  CO2 27  GLUCOSE 256*  BUN 26*  CREATININE 1.03*  CALCIUM 8.8*   ------------------------------------------------------------------------------------------------------------------  Cardiac Enzymes  Recent Labs Lab 07/27/17 1258  TROPONINI <0.03   ------------------------------------------------------------------------------------------------------------------  RADIOLOGY:  Ct Head Wo Contrast  Result Date: 07/27/2017 CLINICAL DATA:  Dizziness, ataxia EXAM: CT HEAD WITHOUT CONTRAST TECHNIQUE: Contiguous axial images were obtained from the base of the skull through the vertex without intravenous contrast. COMPARISON:  None. FINDINGS: Brain: Low-density throughout the right cerebellar hemisphere concerning for acute to subacute infarction. No hemorrhage. Old left occipital infarct. Diffuse cerebral atrophy. No hydrocephalus. Vascular: No hyperdense vessel or unexpected calcification. Skull: No acute calvarial abnormality. Sinuses/Orbits: Visualized paranasal sinuses and mastoids clear. Orbital soft tissues unremarkable. Other: None IMPRESSION: Findings concerning for acute to subacute infarcts in the right cerebellar hemisphere. Old left occipital infarct. Diffuse cerebral atrophy. Electronically Signed   By: Rolm Baptise M.D.   On: 07/27/2017 15:05   Mr Brain Wo Contrast  Result Date: 07/27/2017 CLINICAL DATA:   Dizziness, unsteady gait beginning yesterday. Hypertensive. Follow-up stroke. History of hypertension, off medication. EXAM: MRI HEAD WITHOUT CONTRAST TECHNIQUE: Multiplanar, multiecho  pulse sequences of the brain and surrounding structures were obtained without intravenous contrast. COMPARISON:  CT HEAD July 27, 2017 at 1449 hours FINDINGS: BRAIN: Wedge-like reduced diffusion RIGHT cerebellum corresponding to CT abnormality. Additional patchy reduced diffusion in bilateral cerebella, RIGHT occipital lobe. Low ADC values RIGHT occipital lobe, lesser extent RIGHT cerebellum with faint T1 shortening consistent with laminar necrosis. Relatively normalized ADC values LEFT cerebellum. No susceptibility artifact to suggest hemorrhage. Multifocal LEFT occipital lobe encephalomalacia. Areas LEFT mesial frontal and parietal lobe encephalomalacia. Additional patchy supratentorial white matter FLAIR T2 hyperintensities. Ventricles and sulci are overall normal for patient's age. No midline shift, mass effect or masses. No abnormal extra-axial fluid collections. VASCULAR: Normal major intracranial vascular flow voids present at skull base. SKULL AND UPPER CERVICAL SPINE: No abnormal sellar expansion. No suspicious calvarial bone marrow signal. Craniocervical junction maintained. SINUSES/ORBITS: Paranasal sinus mucosal thickening. RIGHT anterior ethmoid mucosal retention cyst. Mastoid air cells are well aerated. The included ocular globes and orbital contents are non-suspicious. Status post bilateral ocular lens implants. OTHER: None. IMPRESSION: 1. 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 2. Old small LEFT PCA (LEFT occipital lobe) and old small LEFT ACA (LEFT frontoparietal lobes) territory infarcts. 3. Mild chronic small vessel ischemic disease. Electronically Signed   By: Elon Alas M.D.   On: 07/27/2017 22:02     ASSESSMENT AND PLAN:   66 year old female past medical history of  hypertension, hyperlipidemia, diabetes who was noncompliant with the medications presents to the hospital due to dizziness and noted to have accelerated hypertension with an acute/subacute CVA.  1. Acute/Subacute CVA - his is the cause of patient's dizziness. Patient's CT scan of the head and an MRI of the brain confirmed acute/subacute right occipital and bilateral cerebellar infarcts. - appreciate neuro input and they suspect this is likely embolic in nature.  - cont. ASA, Atorvastatin.  - await Carotid Duplex, Echo.  - await Pt, OT, speech eval.    2. Accelerated hypertension-patient presented to the hospital with systolic blood pressures over 200. They have improved since then. Continue Norvasc and will add some Clonidine.  - cont. PRN Hydralazine and attempt to wean off Clevidipine drip.    3. DM TYPe II w/out complication - cont. SSI.   4.  Hyperlipidemia - cont. Atorvastatin   Transfer to floor once off the drip.  All the records are reviewed and case discussed with Care Management/Social Worker. Management plans discussed with the patient, family and they are in agreement.  CODE STATUS: Full code  DVT Prophylaxis: Lovenox  TOTAL TIME TAKING CARE OF THIS PATIENT: 30 minutes.   POSSIBLE D/C IN 1-2 DAYS, DEPENDING ON CLINICAL CONDITION.   Henreitta Leber M.D on 07/28/2017 at 2:26 PM  Between 7am to 6pm - Pager - [phone]  After 6pm go to [email or web address] - Proofreader  [facility]  Office  [phone]  CC: Primary care physician; Crecencio Mc, MD

## 2017-07-28 NOTE — Progress Notes (Signed)
  Spoke briefly with patient regarding elevated A1C.  Patient states that she was taken off insulin in the winter due to her not being able to afford the NPH insulin which was costing over 60$ a month. She endorses a large amount of weight loss since insulin stopped.  Explained to patient that NPH insulin can be purchased from [facility] for 25$ a month- One vial of insulin should last patient at least 30 days.  Patient appreciative of information and states that she currently goes to [facility].  She states that she will try getting insulin from [facility] due to lower cost.  At discharge, consider resuming NPH due to lower cost.   Thanks, Adah Perl, RN, BC-ADM Inpatient Diabetes Coordinator Pager [phone] (8a-5p)

## 2017-07-28 NOTE — Progress Notes (Signed)
Inpatient Diabetes Program Recommendations  AACE/ADA: New Consensus Statement on Inpatient Glycemic Control (2015)  Target Ranges:  Prepandial:   less than 140 mg/dL      Peak postprandial:   less than 180 mg/dL (1-2 hours)      Critically ill patients:  140 - 180 mg/dL   Lab Results  Component Value Date   GLUCAP 247 (H) 07/27/2017   HGBA1C 14.3 (H) 07/27/2017    Review of Glycemic ControlResults for [name], Julie Dominguez (MRN [id number]) as of 07/28/2017 09:16  Ref. Range 07/27/2017 12:58 07/28/2017 05:59  Glucose Latest Ref Range: 65 - 99 mg/dL 285 (H) 256 (H)   Diabetes history: Type 2 DM Outpatient Diabetes medications: Glucotrol 5 mg in AM and 10 mg in PM, Metformin 850 mg bid, Januvia 50 mg daily Current orders for Inpatient glycemic control:  Tradjenta 5 mg daily, Glipizide 5 mg bid, Metformin 850 mg bid  Inpatient Diabetes Program Recommendations:   A1C indicates very poor control of diabetes prior to admit with mean plasma glucose approximately 363 mg/dL.  Please d/c oral diabetes medications and add Novolog moderate tid with meals and HS.  Also consider adding Levemir 10 units bid. Will discuss A1C with patient.   Thanks,   Adah Perl, RN, BC-ADM Inpatient Diabetes Coordinator Pager [phone] (8a-5p)

## 2017-07-28 NOTE — Consult Note (Signed)
Referring Physician: Verdell Carmine    Chief Complaint: Dizziness  HPI: Julie Dominguez is an 66 y.o. female with a history of poorly controlled HTN who presented with complaints of dizziness that she reports has been present for the past week or so.  Can not tell me exactly when her symptoms started.  Feels the dizziness has affected her gait as well.  Felt she was getting worse and presented to the ED for evaluation.  Patient found to have markedly elevated BP and abnormal head CT.   Initial NIHSS of 0.  Date last known well: Unable to determine Time last known well: Unable to determine tPA Given: No: Unable to determine LKW  Past Medical History:  Diagnosis Date  . Diabetes mellitus 2007  . Hyperlipidemia   . Hypertension 1988   uncontrolled     Past Surgical History:  Procedure Laterality Date  . EYE SURGERY Bilateral August 2013, Oct 2013   catraract with lens implant dingledein     Family History  Problem Relation Age of Onset  . Stroke Mother   . Hypertension Mother   . Heart disease Daughter   . Alzheimer's disease Father   . Cancer - Colon Maternal Aunt   . Breast cancer Neg Hx    Social History:  reports that she has never smoked. She has never used smokeless tobacco. She reports that she does not drink alcohol or use drugs.  Allergies: No Known Allergies  Medications:  I have reviewed the patient's current medications. Prior to Admission:  Facility-Administered Medications Prior to Admission  Medication Dose Route Frequency Provider Last Rate Last Dose  . cloNIDine (CATAPRES) tablet 0.1 mg  0.1 mg Oral BID Crecencio Mc, MD       Prescriptions Prior to Admission  Medication Sig Dispense Refill Last Dose  . glipiZIDE (GLUCOTROL) 5 MG tablet TAKE 1 TABLET BY MOUTH EVERY MORNING AND TAKE 2 TABLETS BY MOUTH EVERY EVENING BEFORE MEALS (Patient taking differently: Take 5-10 mg by mouth 2 (two) times daily before a meal. 5 mg every morning and 10 mg every evening  before meals) 90 tablet 0 07/27/2017 at am  . metFORMIN (GLUCOPHAGE) 850 MG tablet TAKE 1 TABLET BY MOUTH TWICE A DAY WITH MEAL 180 tablet 0 07/27/2017 at am  . sitaGLIPtin (JANUVIA) 50 MG tablet Take 1 tablet (50 mg total) by mouth daily. 90 tablet 0 07/27/2017 at am   Scheduled: . atorvastatin  40 mg Oral q1800  . cloNIDine  0.1 mg Oral Daily  . enoxaparin (LOVENOX) injection  40 mg Subcutaneous Q24H  . insulin aspart  0-15 Units Subcutaneous TID WC  . insulin aspart  0-5 Units Subcutaneous QHS  . insulin detemir  10 Units Subcutaneous BID    ROS: History obtained from the patient  General ROS: negative for - chills, fatigue, fever, night sweats, weight gain or weight loss Psychological ROS: negative for - behavioral disorder, hallucinations, memory difficulties, mood swings or suicidal ideation Ophthalmic ROS: negative for - blurry vision, double vision, eye pain or loss of vision ENT ROS: negative for - epistaxis, nasal discharge, oral lesions, sore throat, tinnitus Allergy and Immunology ROS: negative for - hives or itchy/watery eyes Hematological and Lymphatic ROS: negative for - bleeding problems, bruising or swollen lymph nodes Endocrine ROS: negative for - galactorrhea, hair pattern changes, polydipsia/polyuria or temperature intolerance Respiratory ROS: negative for - cough, hemoptysis, shortness of breath or wheezing Cardiovascular ROS: negative for - chest pain, dyspnea on exertion, edema or irregular  heartbeat Gastrointestinal ROS: negative for - abdominal pain, diarrhea, hematemesis, nausea/vomiting or stool incontinence Genito-Urinary ROS: negative for - dysuria, hematuria, incontinence or urinary frequency/urgency Musculoskeletal ROS: negative for - joint swelling or muscular weakness Neurological ROS: as noted in HPI Dermatological ROS: negative for rash and skin lesion changes  Physical Examination: Blood pressure (!) 185/75, pulse 72, temperature 98.4 F (36.9 C),  temperature source Oral, resp. rate (!) 23, height 5\' 3"  (1.6 m), weight 54 kg (119 lb 0.8 oz), SpO2 95 %.  HEENT-  Normocephalic, no lesions, without obvious abnormality.  Normal external eye and conjunctiva.  Normal TM's bilaterally.  Normal auditory canals and external ears. Normal external nose, mucus membranes and septum.  Normal pharynx. Cardiovascular- S1, S2 normal, pulses palpable throughout   Lungs- chest clear, no wheezing, rales, normal symmetric air entry Abdomen- soft, non-tender; bowel sounds normal; no masses,  no organomegaly Extremities- no edema Lymph-no adenopathy palpable Musculoskeletal-no joint tenderness, deformity or swelling Skin-warm and dry, no hyperpigmentation, vitiligo, or suspicious lesions  Neurological Examination   Mental Status: Alert, oriented, thought content appropriate.  Speech fluent without evidence of aphasia.  Able to follow 3 step commands without difficulty. Cranial Nerves: II: Discs flat bilaterally; Visual fields grossly normal, pupils equal, round, reactive to light and accommodation III,IV, VI: ptosis not present, extra-ocular motions intact bilaterally V,VII: smile symmetric, facial light touch sensation normal bilaterally VIII: hearing normal bilaterally IX,X: gag reflex present XI: bilateral shoulder shrug XII: midline tongue extension Motor: Right : Upper extremity   5/5    Left:     Upper extremity   5/5  Lower extremity   5/5     Lower extremity   5/5 Tone and bulk:normal tone throughout; no atrophy noted Sensory: Pinprick and light touch intact throughout, bilaterally Deep Tendon Reflexes: 2+ and symmetric with absent AJ's bilaterally Plantars: Right: downgoing   Left: downgoing Cerebellar: Normal finger-to-nose and normal heel-to-shin testing bilaterally Gait: not tested due to safety concerns   Laboratory Studies:  Basic Metabolic Panel:  Recent Labs Lab 07/27/17 1258 07/28/17 0559  NA 137 136  K 4.0 4.0  CL 100*  100*  CO2 26 27  GLUCOSE 285* 256*  BUN 21* 26*  CREATININE 0.99 1.03*  CALCIUM 9.4 8.8*    Liver Function Tests: No results for input(s): AST, ALT, ALKPHOS, BILITOT, PROT, ALBUMIN in the last 168 hours. No results for input(s): LIPASE, AMYLASE in the last 168 hours. No results for input(s): AMMONIA in the last 168 hours.  CBC:  Recent Labs Lab 07/27/17 1258 07/28/17 0559  WBC 6.2 5.6  HGB 13.1 12.8  HCT 39.4 38.4  MCV 88.5 88.9  PLT 169 151    Cardiac Enzymes:  Recent Labs Lab 07/27/17 1258  TROPONINI <0.03    BNP: Invalid input(s): POCBNP  CBG:  Recent Labs Lab 07/27/17 1700 07/28/17 1143  GLUCAP 247* 198*    Microbiology: Results for orders placed or performed during the hospital encounter of 07/27/17  MRSA PCR Screening     Status: None   Collection Time: 07/27/17  4:54 PM  Result Value Ref Range Status   MRSA by PCR NEGATIVE NEGATIVE Final    Comment:        The GeneXpert MRSA Assay (FDA approved for NASAL specimens only), is one component of a comprehensive MRSA colonization surveillance program. It is not intended to diagnose MRSA infection nor to guide or monitor treatment for MRSA infections.     Coagulation Studies: No results for input(s):  LABPROT, INR in the last 72 hours.  Urinalysis: No results for input(s): COLORURINE, LABSPEC, PHURINE, GLUCOSEU, HGBUR, BILIRUBINUR, KETONESUR, PROTEINUR, UROBILINOGEN, NITRITE, LEUKOCYTESUR in the last 168 hours.  Invalid input(s): APPERANCEUR  Lipid Panel:    Component Value Date/Time   CHOL 289 (H) 07/28/2017 0559   CHOL 355 (H) 03/24/2012 1227   TRIG 135 07/28/2017 0559   TRIG 123 03/24/2012 1227   HDL 50 07/28/2017 0559   HDL 64 (H) 03/24/2012 1227   CHOLHDL 5.8 07/28/2017 0559   VLDL 27 07/28/2017 0559   VLDL 25 03/24/2012 1227   LDLCALC 212 (H) 07/28/2017 0559   LDLCALC 266 (H) 03/24/2012 1227    HgbA1C:  Lab Results  Component Value Date   HGBA1C 14.3 (H) 07/27/2017     Urine Drug Screen:  No results found for: LABOPIA, COCAINSCRNUR, LABBENZ, AMPHETMU, THCU, LABBARB  Alcohol Level: No results for input(s): ETH in the last 168 hours.   Imaging: Ct Head Wo Contrast  Result Date: 07/27/2017 CLINICAL DATA:  Dizziness, ataxia EXAM: CT HEAD WITHOUT CONTRAST TECHNIQUE: Contiguous axial images were obtained from the base of the skull through the vertex without intravenous contrast. COMPARISON:  None. FINDINGS: Brain: Low-density throughout the right cerebellar hemisphere concerning for acute to subacute infarction. No hemorrhage. Old left occipital infarct. Diffuse cerebral atrophy. No hydrocephalus. Vascular: No hyperdense vessel or unexpected calcification. Skull: No acute calvarial abnormality. Sinuses/Orbits: Visualized paranasal sinuses and mastoids clear. Orbital soft tissues unremarkable. Other: None IMPRESSION: Findings concerning for acute to subacute infarcts in the right cerebellar hemisphere. Old left occipital infarct. Diffuse cerebral atrophy. Electronically Signed   By: Rolm Baptise M.D.   On: 07/27/2017 15:05   Mr Brain Wo Contrast  Result Date: 07/27/2017 CLINICAL DATA:  Dizziness, unsteady gait beginning yesterday. Hypertensive. Follow-up stroke. History of hypertension, off medication. EXAM: MRI HEAD WITHOUT CONTRAST TECHNIQUE: Multiplanar, multiecho pulse sequences of the brain and surrounding structures were obtained without intravenous contrast. COMPARISON:  CT HEAD July 27, 2017 at 1449 hours FINDINGS: BRAIN: Wedge-like reduced diffusion RIGHT cerebellum corresponding to CT abnormality. Additional patchy reduced diffusion in bilateral cerebella, RIGHT occipital lobe. Low ADC values RIGHT occipital lobe, lesser extent RIGHT cerebellum with faint T1 shortening consistent with laminar necrosis. Relatively normalized ADC values LEFT cerebellum. No susceptibility artifact to suggest hemorrhage. Multifocal LEFT occipital lobe encephalomalacia. Areas LEFT  mesial frontal and parietal lobe encephalomalacia. Additional patchy supratentorial white matter FLAIR T2 hyperintensities. Ventricles and sulci are overall normal for patient's age. No midline shift, mass effect or masses. No abnormal extra-axial fluid collections. VASCULAR: Normal major intracranial vascular flow voids present at skull base. SKULL AND UPPER CERVICAL SPINE: No abnormal sellar expansion. No suspicious calvarial bone marrow signal. Craniocervical junction maintained. SINUSES/ORBITS: Paranasal sinus mucosal thickening. RIGHT anterior ethmoid mucosal retention cyst. Mastoid air cells are well aerated. The included ocular globes and orbital contents are non-suspicious. Status post bilateral ocular lens implants. OTHER: None. IMPRESSION: 1. 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 2. Old small LEFT PCA (LEFT occipital lobe) and old small LEFT ACA (LEFT frontoparietal lobes) territory infarcts. 3. Mild chronic small vessel ischemic disease. Electronically Signed   By: Elon Alas M.D.   On: 07/27/2017 22:02    Assessment: 66 y.o. female presenting with dizziness and markedly elevated BP.  Head CT performed and shows an acute/subacute right occipital and bilateral cerebellar infarcts.  Concern is for an embolic etiology.  Patient noncompliant with medications at home.  On no antiplatelet therapy.  LDL 212.  A1c 14.3.  Remaining work up pending.   Stroke Risk Factors - diabetes mellitus, hyperlipidemia and hypertension  Plan: 1. MRI, MRA  of the brain without contrast 2. PT consult, OT consult, Speech consult 3. Echocardiogram pending 4. Carotid dopplers pending 5. Prophylactic therapy-Antiplatelet med: Aspirin - dose 325mg  daily 6. NPO until RN stroke swallow screen 7. Telemetry monitoring 8. Frequent neuro checks 9. BP control 10. Statin for lipid management with target LDL<70. 11. Blood sugar control   Alexis Goodell,  MD Neurology [phone] 07/28/2017, 12:09 PM

## 2017-07-28 NOTE — Care Management (Addendum)
RNCM met with patient regarding medication concerns with cost. She states that some of her insulin's were over $60 and "they didn't last a month".  I spoke with Diabetes nurse and the NPH can be obtained as self-pay for ~$20 at [facility]. I advised patient to contact her [employer] Case management to discuss burden they have placed on her- she agrees.  Good Rx card supplied to her for assistance with pharmacy.

## 2017-07-28 NOTE — Progress Notes (Signed)
   07/27/17 2345  Vitals  BP (!) 143/83  MAP (mmHg) 100  BP Location Left Arm  BP Method Automatic  Patient Position (if appropriate) Lying  Pulse Rate 71  Pulse Rate Source Monitor  ECG Heart Rate 73  Cardiac Rhythm NSR  Resp 19  Oxygen Therapy  SpO2 97 %  O2 Device Room Air  Pulse Oximetry Type Continuous   Cleviprex drip discontinued due to SBP lower than 200.  Marguarite Arbour, RN

## 2017-07-28 NOTE — Plan of Care (Signed)
Problem: Food- and Nutrition-Related Knowledge Deficit (NB-1.1) Goal: Nutrition education Formal process to instruct or train a patient/client in a skill or to impart knowledge to help patients/clients voluntarily manage or modify food choices and eating behavior to maintain or improve health. Outcome: Completed/Met Date Met: 07/28/17 Nutrition Education Note  RD identified need for nutrition education regarding a diabetes and a heart healthy diet on initial assessment.   Lipid Panel     Component Value Date/Time   CHOL 289 (H) 07/28/2017 0559   CHOL 355 (H) 03/24/2012 1227   TRIG 135 07/28/2017 0559   TRIG 123 03/24/2012 1227   HDL 50 07/28/2017 0559   HDL 64 (H) 03/24/2012 1227   CHOLHDL 5.8 07/28/2017 0559   VLDL 27 07/28/2017 0559   VLDL 25 03/24/2012 1227   LDLCALC 212 (H) 07/28/2017 0559   LDLCALC 266 (H) 03/24/2012 1227   Lab Results  Component Value Date   HGBA1C 14.3 (H) 07/27/2017   RD provided "Heart Healthy Consistent Carbohydrate Nutrition Therapy" handout from the Academy of Nutrition and Dietetics. Reviewed patient's dietary recall. Provided examples on ways to decrease sodium and fat intake in diet. Discouraged intake of processed foods and use of salt shaker. Encouraged fresh fruits and vegetables as well as whole grain sources of carbohydrates to maximize fiber intake. Discussed different food groups and their effects on blood sugar, emphasizing carbohydrate-containing foods. Provided list of carbohydrates and recommended serving sizes of common foods. Discussed importance of controlled and consistent carbohydrate intake throughout the day. Teach back method used.  Expect fair compliance.  Body mass index is 21.09 kg/m. Pt meets criteria for normal weight based on current BMI.  Current diet order is Heart Healthy/Carbohydrate Modifed. Meal completion not recorded at this time. Labs and medications reviewed. RD contact information provided.  Willey Blade, MS,  RD, LDN Pager: [phone] After Hours Pager: [phone]

## 2017-07-28 NOTE — Progress Notes (Signed)
Prepare to transfer to room 111.  Report to [facility]

## 2017-07-28 NOTE — Progress Notes (Signed)
Alert and oriented x4. Up in chair. NHISS score is zero. Seen by Neuro Dr Doy Mince, Care manager, Diabetic coordinator, Dr Mortimer Fries, Dr Verdell Carmine. SLT, PT and OT. Echo completed. Await carotid US today.  Clonidine started. She has not taken this antihypertensive in about 4 months due to cost. Care Manager is working to help pt afford all meds She has taken NPH SQ insulin before and says she is able to give herself insulin. Relatives have been in to see.  And they encourage her to get meds and take care of herself

## 2017-07-28 NOTE — Evaluation (Signed)
Physical Therapy Evaluation Patient Details Name: Julie Dominguez MRN: [id number] DOB: 1951/05/12 Today's Date: 07/28/2017   History of Present Illness  Pt admitted for hypertensive urgercy. Pt now with positive acute on subacute R occipital lobe and B cerebellar infarcts. History includes L PCA and ACA CVA, HTN, and DM. Pt with complaints of dizziness after not taking BP meds in 4 months. Elevated BP upon admission, has decreased to 130/62 at this time.  Clinical Impression  Pt is a pleasant 66 year old female who was admitted for hypertensive urgerncy. Pt demonstrates all bed mobility/transfers/ambulation at baseline level. No AD required at this time. All sensation/coordination intact in B UE/LE. Strength WNL and equal B. Pt A&O x 4. Pt does not require any further PT needs at this time. Pt will be dc in house and does not require follow up. RN aware. Will dc current orders.      Follow Up Recommendations No PT follow up    Equipment Recommendations  None recommended by PT    Recommendations for Other Services       Precautions / Restrictions Precautions Precautions: Fall Restrictions Weight Bearing Restrictions: No      Mobility  Bed Mobility Overal bed mobility: Independent             General bed mobility comments: safe technique, slightly impulsive, however able to respond to cues to wait for therapist  Transfers Overall transfer level: Independent Equipment used: None             General transfer comment: safe technique performed with upright posture. No RW used  Ambulation/Gait Ambulation/Gait assistance: Min guard Ambulation Distance (Feet): 100 Feet Assistive device: None Gait Pattern/deviations: Step-through pattern     General Gait Details: ambulated with slow gait speed with L step length slightly decreased compared to R side. With cues, able to improve to symmetrical gait pattern with safe technique. No AD for balance and pt cued to look  forward instead of at ground.  Stairs            Wheelchair Mobility    Modified Rankin (Stroke Patients Only)       Balance Overall balance assessment: Independent                                           Pertinent Vitals/Pain Pain Assessment: No/denies pain    Home Living Family/patient expects to be discharged to:: Private residence Living Arrangements: Alone   Type of Home: House Home Access: Level entry     Home Layout: One level Home Equipment: None      Prior Function Level of Independence: Independent         Comments: Pt very independent with all ADLs prior to admission     Hand Dominance        Extremity/Trunk Assessment   Upper Extremity Assessment Upper Extremity Assessment: Overall WFL for tasks assessed    Lower Extremity Assessment Lower Extremity Assessment: Overall WFL for tasks assessed       Communication   Communication: No difficulties  Cognition Arousal/Alertness: Awake/alert Behavior During Therapy: WFL for tasks assessed/performed Overall Cognitive Status: Within Functional Limits for tasks assessed  General Comments      Exercises     Assessment/Plan    PT Assessment Patent does not need any further PT services  PT Problem List         PT Treatment Interventions      PT Goals (Current goals can be found in the Care Plan section)  Acute Rehab PT Goals Patient Stated Goal: to go home PT Goal Formulation: All assessment and education complete, DC therapy Time For Goal Achievement: 07/28/17 Potential to Achieve Goals: Good    Frequency     Barriers to discharge        Co-evaluation               AM-PAC PT "6 Clicks" Daily Activity  Outcome Measure Difficulty turning over in bed (including adjusting bedclothes, sheets and blankets)?: None Difficulty moving from lying on back to sitting on the side of the bed? :  None Difficulty sitting down on and standing up from a chair with arms (e.g., wheelchair, bedside commode, etc,.)?: None Help needed moving to and from a bed to chair (including a wheelchair)?: None Help needed walking in hospital room?: None Help needed climbing 3-5 steps with a railing? : None 6 Click Score: 24    End of Session   Activity Tolerance: Patient tolerated treatment well Patient left: in chair;with family/visitor present Nurse Communication: Mobility status PT Visit Diagnosis: Dizziness and giddiness (R42)    Time: 5686-1683 PT Time Calculation (min) (ACUTE ONLY): 17 min   Charges:   PT Evaluation $PT Eval Low Complexity: 1 Low PT Treatments $Gait Training: 8-22 mins   PT G Codes:   PT G-Codes **NOT FOR INPATIENT CLASS** Functional Assessment Tool Used: AM-PAC 6 Clicks Basic Mobility Functional Limitation: Mobility: Walking and moving around Mobility: Walking and Moving Around Current Status (F2902): 0 percent impaired, limited or restricted Mobility: Walking and Moving Around Goal Status (X1155): 0 percent impaired, limited or restricted Mobility: Walking and Moving Around Discharge Status (M0802): 0 percent impaired, limited or restricted    Greggory Stallion, PT, DPT [phone]   Julie Dominguez 07/28/2017, 2:37 PM

## 2017-07-28 NOTE — Progress Notes (Signed)
Pt's blood pressure below the goal of 200.  SBP ranges from 140-150.  Notified [place], NP.   Stated to continue to monitor neuro status.  No new orders.  Marguarite Arbour, RN.

## 2017-07-28 NOTE — Progress Notes (Signed)
SLP Cancellation Note  Patient Details Name: Julie Dominguez MRN: [id number] DOB: 1951-06-11   Cancelled treatment:       Reason Eval/Treat Not Completed: SLP screened, no needs identified, will sign off (Chart reviewed; consulted NSG; met with pt )  Pt denied any difficulty swallowing and is currently on a regular diet; tolerates swallowing pills w/ water per NSG. Pt conversed at conversational level w/out deficits noted; pt denied any speech-language deficits.  No further skilled ST services indicated as pt appears at her baseline. Pt agreed. NSG to reconsult if any change in status.   Carolynn Sayers, SLP-Graduate Student Carolynn Sayers 07/28/2017, 10:16 AM   This patient note, response to treatment and overall treatment plan has been reviewed and this clinician agrees with the information provided.  Orinda Kenner, [place], [place] 07/28/17, 10:25 AM [phone]

## 2017-07-28 NOTE — Progress Notes (Signed)
*  PRELIMINARY RESULTS* Echocardiogram 2D Echocardiogram has been performed.  Julie Dominguez 07/28/2017, 3:47 PM

## 2017-07-28 NOTE — Plan of Care (Signed)
Problem: Education: Goal: Knowledge of disease or condition will improve Outcome: Progressing discussed risk factors and complications of a stroke Goal: Knowledge of secondary prevention will improve Outcome: Progressing Discussed importance of taking hypertensive medications  Problem: Ischemic Stroke/TIA Tissue Perfusion: Goal: Complications of ischemic stroke/TIA will be minimized Outcome: Progressing Monitor blood pressure closely

## 2017-07-28 NOTE — Clinical Social Work Note (Signed)
CSW consulted by nursing due to patient not being able to afford his home medications. CSW has informed the RN CM and she will follow up with patient regarding this. Shela Leff MSW,LcSW [phone]

## 2017-07-28 NOTE — Progress Notes (Signed)
Initial Nutrition Assessment  DOCUMENTATION CODES:   Non-severe (moderate) malnutrition in context of social or environmental circumstances  INTERVENTION:  Provide Glucerna Shake po TID, each supplement provides 220 kcal and 10 grams of protein.  Provide daily multivitamin with minerals.  Reviewed "Heart Healthy Carbohydrate Consistent Nutrition Therapy" handout from the Academy [employer] with patient (note to follow).  NUTRITION DIAGNOSIS:   Malnutrition (Moderate) related to social / environmental circumstances (lives alone, not eating balanced diet) as evidenced by moderate depletion of body fat, moderate depletions of muscle mass, severe depletion of muscle mass.  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   Spoke with patient at bedside. She reports her appetite is good, she just hasn't felt like eating in the past 1-2 days. She reports she lives alone and does not cook a lot. Patient reports her intake varies day to day, so she is unable to provide information on her typical intake. Only reports that she enjoys hotdogs. The only fruits she eats are occasionally apples and bananas. Does not eat many vegetables. She reports she has not been taking her DM medications lately, so she has been losing weight. Patient reports she has not received much education on eating a heart healthy diet or how to eat for her DM. She is amenable to have education.  UBW is 130 lbs. Patient was 130.5 lbs on 12/24/2016 and 125.8 lbs on 03/31/2017. She has lost 11.5 lbs (8.8% body weight) over 7 months, which is not significant for  time frame.  Medications reviewed and include: Novolog 0-15 units TID, Novolog 0-5 units QHS, Levemir 10 units BID.  Labs reviewed: CBG 198-247, Chloride 100, BUN 26, Creatinine 1.03, Cholesterol 289, LDL 212, HgbA1c 14.3.  Nutrition-Focused physical exam completed. Findings are moderate fat depletion (moderate depletion of orbital region, upper arm region, thoracic/lumbar region), moderate-severe muscle depletion (moderate depletion of temple region, clavicle bone region, clavicle/acromion bone region, scapular bone region, dorsal hand; severe depletion of patellar region, anterior thigh region, posterior calf region), and no edema.   Diet Order:  Diet heart healthy/carb modified Room service appropriate? Yes; Fluid consistency: Thin  Skin:  Reviewed, no issues  Last BM:  PTA  Height:   Ht Readings from Last 1 Encounters:  07/27/17 5\' 3"  (1.6 m)    Weight:   Wt Readings from Last 1 Encounters:  07/27/17 119 lb 0.8 oz (54 kg)    Ideal Body Weight:  52.3 kg  BMI:  Body mass index is 21.09 kg/m.  Estimated Nutritional Needs:   Kcal:  1350-1620 (25-30 kcal/kg)  Protein:  65-75 grams (1.2-1.4 grams/kg)  Fluid:  1.6-1.9 L/day (30-35 ml/kg)  EDUCATION NEEDS:   Education needs addressed  Willey Blade, MS, RD, LDN Pager: [phone] After Hours Pager: [phone]

## 2017-07-29 ENCOUNTER — Telehealth: Payer: Self-pay | Admitting: Internal Medicine

## 2017-07-29 ENCOUNTER — Inpatient Hospital Stay: Payer: Medicare HMO

## 2017-07-29 LAB — GLUCOSE, CAPILLARY
GLUCOSE-CAPILLARY: 233 mg/dL — AB (ref 65–99)
Glucose-Capillary: 208 mg/dL — ABNORMAL HIGH (ref 65–99)

## 2017-07-29 MED ORDER — ASPIRIN 81 MG PO TABS: 81.0000 mg | ORAL_TABLET | Freq: Every day | ORAL | 1 refills | Status: AC

## 2017-07-29 MED ORDER — SITAGLIPTIN PHOSPHATE 50 MG PO TABS: 50.0000 mg | ORAL_TABLET | Freq: Every day | ORAL | 0 refills | Status: DC

## 2017-07-29 MED ORDER — CLOPIDOGREL BISULFATE 75 MG PO TABS: 75.0000 mg | ORAL_TABLET | Freq: Every day | ORAL | 1 refills | Status: DC

## 2017-07-29 MED ORDER — METFORMIN HCL 850 MG PO TABS: ORAL_TABLET | ORAL | 0 refills | Status: DC

## 2017-07-29 MED ORDER — CLONIDINE HCL 0.1 MG PO TABS: 0.1000 mg | ORAL_TABLET | Freq: Two times a day (BID) | ORAL | 1 refills | Status: DC

## 2017-07-29 MED ORDER — ATORVASTATIN CALCIUM 40 MG PO TABS: 40.0000 mg | ORAL_TABLET | Freq: Every day | ORAL | 1 refills | Status: DC

## 2017-07-29 MED ORDER — ONDANSETRON HCL 4 MG/2ML IJ SOLN: 4.0000 mg | Freq: Four times a day (QID) | INTRAMUSCULAR | Status: DC | PRN

## 2017-07-29 MED ORDER — AMLODIPINE BESYLATE 10 MG PO TABS: 10.0000 mg | ORAL_TABLET | Freq: Every day | ORAL | 1 refills | Status: DC

## 2017-07-29 MED ORDER — GLIPIZIDE 5 MG PO TABS: ORAL_TABLET | ORAL | 0 refills | Status: DC

## 2017-07-29 MED ORDER — INSULIN NPH (HUMAN) (ISOPHANE) 100 UNIT/ML ~~LOC~~ SUSP: 10.0000 [IU] | Freq: Two times a day (BID) | SUBCUTANEOUS | 1 refills | Status: DC

## 2017-07-29 NOTE — Progress Notes (Signed)
MD order received in [facility] to discharge pt home today; verbally reviewed AVS with pt, gave Rxs to pt; no questions voiced at this time; pt's discharge pending her getting ready to go home; verbally instructed pt to use her nurse callbell when she is dressed and ready to go

## 2017-07-29 NOTE — Progress Notes (Signed)
Pt ready for discharge; pt discharged via wheelchair by auxillary to the visitor's entrance

## 2017-07-29 NOTE — Telephone Encounter (Signed)
Sharika from [facility] called needing to schedule pt with a HFU for hypertensive urgency. Pt is being discharged today. Please advise, thank you!

## 2017-07-29 NOTE — Progress Notes (Addendum)
Subjective: Patient out of bed ambulating today.  Reports no new neurological symptoms.    Objective: Current vital signs: BP (!) 151/68 (BP Location: Left Arm)   Pulse 75   Temp 98.4 F (36.9 C) (Oral)   Resp 18   Ht 5\' 3"  (1.6 m)   Wt 55.2 kg (121 lb 9.6 oz)   SpO2 94%   BMI 21.54 kg/m  Vital signs in last 24 hours: Temp:  [97.9 F (36.6 C)-98.4 F (36.9 C)] 98.4 F (36.9 C) (08/30 1140) Pulse Rate:  [62-75] 75 (08/30 1140) Resp:  [14-24] 18 (08/30 0441) BP: (121-182)/(61-86) 151/68 (08/30 1140) SpO2:  [94 %-99 %] 94 % (08/30 1140) Weight:  [55.2 kg (121 lb 9.6 oz)] 55.2 kg (121 lb 9.6 oz) (08/29 1707)  Intake/Output from previous day: 08/29 0701 - 08/30 0700 In: 360 [P.O.:360] Out: 100 [Urine:100] Intake/Output this shift: Total I/O In: 237 [P.O.:237] Out: -  Nutritional status: Diet heart healthy/carb modified Room service appropriate? Yes; Fluid consistency: Thin Diet - low sodium heart healthy Diet Carb Modified  Neurologic Exam: Mental Status: Alert, oriented, thought content appropriate.  Speech fluent without evidence of aphasia.  Able to follow 3 step commands without difficulty. Cranial Nerves: II: Discs flat bilaterally; Visual fields grossly normal, pupils equal, round, reactive to light and accommodation III,IV, VI: ptosis not present, extra-ocular motions intact bilaterally V,VII: smile symmetric, facial light touch sensation normal bilaterally VIII: hearing normal bilaterally IX,X: gag reflex present XI: bilateral shoulder shrug XII: midline tongue extension Motor: Right :  Upper extremity   5/5                                      Left:     Upper extremity   5/5             Lower extremity   5/5                                                  Lower extremity   5/5 Tone and bulk:normal tone throughout; no atrophy noted Sensory: Pinprick and light touch intact throughout, bilaterally Gait: ambulation narrow based but patient holding onto  things  Lab Results: Basic Metabolic Panel:  Recent Labs Lab 07/27/17 1258 07/28/17 0559  NA 137 136  K 4.0 4.0  CL 100* 100*  CO2 26 27  GLUCOSE 285* 256*  BUN 21* 26*  CREATININE 0.99 1.03*  CALCIUM 9.4 8.8*    Liver Function Tests: No results for input(s): AST, ALT, ALKPHOS, BILITOT, PROT, ALBUMIN in the last 168 hours. No results for input(s): LIPASE, AMYLASE in the last 168 hours. No results for input(s): AMMONIA in the last 168 hours.  CBC:  Recent Labs Lab 07/27/17 1258 07/28/17 0559  WBC 6.2 5.6  HGB 13.1 12.8  HCT 39.4 38.4  MCV 88.5 88.9  PLT 169 151    Cardiac Enzymes:  Recent Labs Lab 07/27/17 1258  TROPONINI <0.03    Lipid Panel:  Recent Labs Lab 07/28/17 0559  CHOL 289*  TRIG 135  HDL 50  CHOLHDL 5.8  VLDL 27  LDLCALC 212*    CBG:  Recent Labs Lab 07/28/17 1143 07/28/17 1637 07/28/17 2130 07/29/17 0729 07/29/17 1135  GLUCAP 198* 115* 273* 208* 233*  Microbiology: Results for orders placed or performed during the hospital encounter of 07/27/17  MRSA PCR Screening     Status: None   Collection Time: 07/27/17  4:54 PM  Result Value Ref Range Status   MRSA by PCR NEGATIVE NEGATIVE Final    Comment:        The GeneXpert MRSA Assay (FDA approved for NASAL specimens only), is one component of a comprehensive MRSA colonization surveillance program. It is not intended to diagnose MRSA infection nor to guide or monitor treatment for MRSA infections.     Coagulation Studies: No results for input(s): LABPROT, INR in the last 72 hours.  Imaging: Ct Head Wo Contrast  Result Date: 07/27/2017 CLINICAL DATA:  Dizziness, ataxia EXAM: CT HEAD WITHOUT CONTRAST TECHNIQUE: Contiguous axial images were obtained from the base of the skull through the vertex without intravenous contrast. COMPARISON:  None. FINDINGS: Brain: Low-density throughout the right cerebellar hemisphere concerning for acute to subacute infarction. No  hemorrhage. Old left occipital infarct. Diffuse cerebral atrophy. No hydrocephalus. Vascular: No hyperdense vessel or unexpected calcification. Skull: No acute calvarial abnormality. Sinuses/Orbits: Visualized paranasal sinuses and mastoids clear. Orbital soft tissues unremarkable. Other: None IMPRESSION: Findings concerning for acute to subacute infarcts in the right cerebellar hemisphere. Old left occipital infarct. Diffuse cerebral atrophy. Electronically Signed   By: Rolm Baptise M.D.   On: 07/27/2017 15:05   Mr Brain Wo Contrast  Result Date: 07/27/2017 CLINICAL DATA:  Dizziness, unsteady gait beginning yesterday. Hypertensive. Follow-up stroke. History of hypertension, off medication. EXAM: MRI HEAD WITHOUT CONTRAST TECHNIQUE: Multiplanar, multiecho pulse sequences of the brain and surrounding structures were obtained without intravenous contrast. COMPARISON:  CT HEAD July 27, 2017 at 1449 hours FINDINGS: BRAIN: Wedge-like reduced diffusion RIGHT cerebellum corresponding to CT abnormality. Additional patchy reduced diffusion in bilateral cerebella, RIGHT occipital lobe. Low ADC values RIGHT occipital lobe, lesser extent RIGHT cerebellum with faint T1 shortening consistent with laminar necrosis. Relatively normalized ADC values LEFT cerebellum. No susceptibility artifact to suggest hemorrhage. Multifocal LEFT occipital lobe encephalomalacia. Areas LEFT mesial frontal and parietal lobe encephalomalacia. Additional patchy supratentorial white matter FLAIR T2 hyperintensities. Ventricles and sulci are overall normal for patient's age. No midline shift, mass effect or masses. No abnormal extra-axial fluid collections. VASCULAR: Normal major intracranial vascular flow voids present at skull base. SKULL AND UPPER CERVICAL SPINE: No abnormal sellar expansion. No suspicious calvarial bone marrow signal. Craniocervical junction maintained. SINUSES/ORBITS: Paranasal sinus mucosal thickening. RIGHT anterior ethmoid  mucosal retention cyst. Mastoid air cells are well aerated. The included ocular globes and orbital contents are non-suspicious. Status post bilateral ocular lens implants. OTHER: None. IMPRESSION: 1. 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 2. Old small LEFT PCA (LEFT occipital lobe) and old small LEFT ACA (LEFT frontoparietal lobes) territory infarcts. 3. Mild chronic small vessel ischemic disease. Electronically Signed   By: Elon Alas M.D.   On: 07/27/2017 22:02   US Carotid Bilateral (at [facility] And [facility] Only)  Result Date: 07/29/2017 CLINICAL DATA:  CVA. History of hypertension, diabetes and syncopal episode. EXAM: BILATERAL CAROTID DUPLEX ULTRASOUND TECHNIQUE: Pearline Cables scale imaging, color Doppler and duplex ultrasound were performed of bilateral carotid and vertebral arteries in the neck. COMPARISON:  None. FINDINGS: Criteria: Quantification of carotid stenosis is based on velocity parameters that correlate the residual internal carotid diameter with NASCET-based stenosis levels, using the diameter of the distal internal carotid lumen as the denominator for stenosis measurement. The following velocity measurements were obtained: RIGHT ICA:  76/26 cm/sec CCA:  876/81 cm/sec SYSTOLIC ICA/CCA RATIO:  0.7 DIASTOLIC ICA/CCA RATIO:  1.6 ECA:  139 cm/sec LEFT ICA:  113/34 cm/sec CCA:  157/26 cm/sec SYSTOLIC ICA/CCA RATIO:  0.9 DIASTOLIC ICA/CCA RATIO:  1.6 ECA:  189 cm/sec RIGHT CAROTID ARTERY: There is a minimal amount of mixed echogenic plaque within the right carotid bulb (image 15), extending to involve the origin and proximal aspects of the right internal carotid artery (image 22), not resulting in elevated peak systolic velocities within the right internal carotid artery to suggest a hemodynamically significant stenosis. RIGHT VERTEBRAL ARTERY:  Antegrade flow LEFT CAROTID ARTERY: There is a minimal to moderate amount of atherosclerotic plaque within the left carotid bulb  (images 46 and 47), extending to involve the origin and proximal aspects of the left internal carotid artery (image 54), not resulting in elevated peak systolic velocities within the interrogated course of the left internal carotid artery to suggest a hemodynamically significant stenosis. LEFT VERTEBRAL ARTERY:  Antegrade flow IMPRESSION: Minimal to moderate amount of bilateral atherosclerotic plaque, left greater than right, not resulting in a hemodynamically significant stenosis within either internal carotid artery. Electronically Signed   By: Sandi Mariscal M.D.   On: 07/29/2017 10:43    Medications:  I have reviewed the patient's current medications. Scheduled: . amLODipine  10 mg Oral Daily  . atorvastatin  40 mg Oral q1800  . cloNIDine  0.1 mg Oral BID  . enoxaparin (LOVENOX) injection  40 mg Subcutaneous Q24H  . feeding supplement (GLUCERNA SHAKE)  237 mL Oral TID BM  . insulin aspart  0-15 Units Subcutaneous TID WC  . insulin aspart  0-5 Units Subcutaneous QHS  . insulin detemir  10 Units Subcutaneous BID  . multivitamin with minerals  1 tablet Oral Daily    Assessment/Plan: Patient remains stable.  MRI of the brain reviewed and shows acute right occipital and bilateral cerebellar infarcts.  Carotid dopplers show no evidence of hemodynamically significant stenosis.  Echocardiogram shows no cardiac source of emboli with an EF of 60-65%.  A1c 14.3, LDL 212. EKG reviewed.  Not felt to be consistent with atrial fibrillation.  Recommendations: 1.  Risk factor modification with blood sugar control and BP medication compliance 2.  Statin for lipid management with target LDL<70. 3.  Continue ASA 4.  Concern remains for embolic etiology, would recommend holter/LINQ monitoring on an outpatient basis    LOS: 2 days   Alexis Goodell, MD Neurology [phone] 07/29/2017  1:46 PM

## 2017-07-29 NOTE — Discharge Summary (Signed)
[facility] NAME: Julie Dominguez    MR#:  [id number]  DATE OF BIRTH:  [birth year]  DATE OF ADMISSION:  07/27/2017 ADMITTING PHYSICIAN: Loletha Grayer, MD  DATE OF DISCHARGE: 07/29/2017  PRIMARY CARE PHYSICIAN: Crecencio Mc, MD    ADMISSION DIAGNOSIS:  Hyperglycemia [R73.9] CVA (cerebral vascular accident) ([place]) [I63.9] Cerebellar infarct ([place]) [I63.9] Hypertensive emergency [I16.1]  DISCHARGE DIAGNOSIS:  Active Problems:   Hypertensive urgency   Cerebrovascular accident (CVA) ([place])   SECONDARY DIAGNOSIS:   Past Medical History:  Diagnosis Date  . Diabetes mellitus 2007  . Hyperlipidemia   . Hypertension 1988   uncontrolled     HOSPITAL COURSE:   66 year old female past medical history of hypertension, hyperlipidemia, diabetes who was noncompliant with the medications presents to the hospital due to dizziness and noted to have accelerated hypertension with an acute/subacute CVA.  1. Acute/Subacute CVA - this was the cause of patient's dizziness. Patient's CT scan of the head and an MRI of the brain confirmed acute/subacute right occipital and bilateral cerebellar infarcts. - appreciate neuro input and they thought it was embolic in nature.  - pt. Was seen by PT/OT and did not require any services. This was secondary to pt's uncontrolled HTN and non-compliance.   - pt. Will be discharged on ASA, Plavix, Atorvastatin and BP control.   - pt. ECG on admission was questionable for a. Fib but unlikely this is a.fib as p waves were noted on ECG.  Pt. Will set up with Cardiology as outpatient for a Holter Monitor to r/o A. Fib.    2. Accelerated hypertension-patient presented to the hospital with systolic blood pressures over 200. Secondary to medical noncompliance. -Patient was initially started on a Clevidipine drip and restarted back on her home meds and weaned off the drip.  - BP has now improved.  Patient will continue his  Norvasc, clonidine for her blood pressure.  3. DM TYPe II w/out complication - she was significantly noncompliant with her meds and her hemoglobin A1c was greater than 14. Patient was seen by diabetes coordinator and not being discharged on NPH insulin twice a day and along with resuming her oral meds including metformin, glipizide, Januvia.  4.  Hyperlipidemia - patient was discharged on a high dose intensity statin. Atorvastatin 40 mg Daily.   DISCHARGE CONDITIONS:   Stable.   CONSULTS OBTAINED:  Treatment Team:  Wilhelmina Mcardle, MD Alexis Goodell, MD  DRUG ALLERGIES:  No Known Allergies  DISCHARGE MEDICATIONS:   Allergies as of 07/29/2017   No Known Allergies     Medication List    TAKE these medications   amLODipine 10 MG tablet Commonly known as:  NORVASC Take 1 tablet (10 mg total) by mouth daily.   aspirin 81 MG tablet Take 1 tablet (81 mg total) by mouth daily.   atorvastatin 40 MG tablet Commonly known as:  LIPITOR Take 1 tablet (40 mg total) by mouth daily at 6 PM. What changed:  how much to take  when to take this   cloNIDine 0.1 MG tablet Commonly known as:  CATAPRES Take 1 tablet (0.1 mg total) by mouth 2 (two) times daily.   clopidogrel 75 MG tablet Commonly known as:  PLAVIX Take 1 tablet (75 mg total) by mouth daily.   glipiZIDE 5 MG tablet Commonly known as:  GLUCOTROL TAKE 1 TABLET BY MOUTH EVERY MORNING AND TAKE 2 TABLETS BY MOUTH EVERY EVENING BEFORE MEALS What changed:  how much to take  how to take this  when to take this  additional instructions   insulin NPH Human 100 UNIT/ML injection Commonly known as:  HUMULIN N,NOVOLIN N Inject 0.1 mLs (10 Units total) into the skin 2 (two) times daily before a meal.   metFORMIN 850 MG tablet Commonly known as:  GLUCOPHAGE TAKE 1 TABLET BY MOUTH TWICE A DAY WITH MEAL   sitaGLIPtin 50 MG tablet Commonly known as:  JANUVIA Take 1 tablet (50 mg total) by mouth daily.             Discharge Care Instructions        Start     Ordered   07/30/17 0000  amLODipine (NORVASC) 10 MG tablet  Daily     07/29/17 1256   07/29/17 0000  atorvastatin (LIPITOR) 40 MG tablet  Daily-1800     07/29/17 1256   07/29/17 0000  cloNIDine (CATAPRES) 0.1 MG tablet  2 times daily     07/29/17 1256   07/29/17 0000  glipiZIDE (GLUCOTROL) 5 MG tablet     07/29/17 1256   07/29/17 0000  metFORMIN (GLUCOPHAGE) 850 MG tablet     07/29/17 1256   07/29/17 0000  sitaGLIPtin (JANUVIA) 50 MG tablet  Daily     07/29/17 1256   07/29/17 0000  aspirin 81 MG tablet  Daily     07/29/17 1256   07/29/17 0000  clopidogrel (PLAVIX) 75 MG tablet  Daily     07/29/17 1256   07/29/17 0000  insulin NPH Human (HUMULIN N,NOVOLIN N) 100 UNIT/ML injection  2 times daily before meals     07/29/17 1256   07/29/17 0000  Activity as tolerated - No restrictions     07/29/17 1256   07/29/17 0000  Diet - low sodium heart healthy     07/29/17 1256   07/29/17 0000  Diet Carb Modified     07/29/17 1256        DISCHARGE INSTRUCTIONS:   DIET:  Cardiac diet and Diabetic diet  DISCHARGE CONDITION:  Stable  ACTIVITY:  Activity as tolerated  OXYGEN:  Home Oxygen: No.   Oxygen Delivery: room air  DISCHARGE LOCATION:  home   If you experience worsening of your admission symptoms, develop shortness of breath, life threatening emergency, suicidal or homicidal thoughts you must seek medical attention immediately by calling 911 or calling your MD immediately  if symptoms less severe.  You Must read complete instructions/literature along with all the possible adverse reactions/side effects for all the Medicines you take and that have been prescribed to you. Take any new Medicines after you have completely understood and accpet all the possible adverse reactions/side effects.   Please note  You were cared for by a hospitalist during your hospital stay. If you have any questions about your discharge  medications or the care you received while you were in the hospital after you are discharged, you can call the unit and asked to speak with the hospitalist on call if the hospitalist that took care of you is not available. Once you are discharged, your primary care physician will handle any further medical issues. Please note that NO REFILLS for any discharge medications will be authorized once you are discharged, as it is imperative that you return to your primary care physician (or establish a relationship with a primary care physician if you do not have one) for your aftercare needs so that they can reassess your need for medications and monitor  your lab values.     Today   Has a slight headache and some nausea but no other complaints.  Otherwise doing well.  BP improved.  Will d/c home today.  VITAL SIGNS:  Blood pressure (!) 151/68, pulse 75, temperature 98.4 F (36.9 C), temperature source Oral, resp. rate 18, height 5\' 3"  (1.6 m), weight 55.2 kg (121 lb 9.6 oz), SpO2 94 %.  I/O:   Intake/Output Summary (Last 24 hours) at 07/29/17 1330 Last data filed at 07/29/17 0909  Gross per 24 hour  Intake              237 ml  Output                0 ml  Net              237 ml    PHYSICAL EXAMINATION:   GENERAL:  66 y.o.-year-old patient lying in bed in no acute distress.  EYES: Pupils equal, round, reactive to light and accommodation. No scleral icterus. Extraocular muscles intact.  HEENT: Head atraumatic, normocephalic. Oropharynx and nasopharynx clear.  NECK:  Supple, no jugular venous distention. No thyroid enlargement, no tenderness.  LUNGS: Normal breath sounds bilaterally, no wheezing, rales, rhonchi. No use of accessory muscles of respiration.  CARDIOVASCULAR: S1, S2 normal. No murmurs, rubs, or gallops.  ABDOMEN: Soft, nontender, nondistended. Bowel sounds present. No organomegaly or mass.  EXTREMITIES: No cyanosis, clubbing or edema b/l.    NEUROLOGIC: Cranial nerves II through  XII are intact. No focal Motor or sensory deficits b/l.   PSYCHIATRIC: The patient is alert and oriented x 3.  SKIN: No obvious rash, lesion, or ulcer.    DATA REVIEW:   CBC  Recent Labs Lab 07/28/17 0559  WBC 5.6  HGB 12.8  HCT 38.4  PLT 151    Chemistries   Recent Labs Lab 07/28/17 0559  NA 136  K 4.0  CL 100*  CO2 27  GLUCOSE 256*  BUN 26*  CREATININE 1.03*  CALCIUM 8.8*    Cardiac Enzymes  Recent Labs Lab 07/27/17 1258  TROPONINI <0.03    Microbiology Results  Results for orders placed or performed during the hospital encounter of 07/27/17  MRSA PCR Screening     Status: None   Collection Time: 07/27/17  4:54 PM  Result Value Ref Range Status   MRSA by PCR NEGATIVE NEGATIVE Final    Comment:        The GeneXpert MRSA Assay (FDA approved for NASAL specimens only), is one component of a comprehensive MRSA colonization surveillance program. It is not intended to diagnose MRSA infection nor to guide or monitor treatment for MRSA infections.     RADIOLOGY:  Ct Head Wo Contrast  Result Date: 07/27/2017 CLINICAL DATA:  Dizziness, ataxia EXAM: CT HEAD WITHOUT CONTRAST TECHNIQUE: Contiguous axial images were obtained from the base of the skull through the vertex without intravenous contrast. COMPARISON:  None. FINDINGS: Brain: Low-density throughout the right cerebellar hemisphere concerning for acute to subacute infarction. No hemorrhage. Old left occipital infarct. Diffuse cerebral atrophy. No hydrocephalus. Vascular: No hyperdense vessel or unexpected calcification. Skull: No acute calvarial abnormality. Sinuses/Orbits: Visualized paranasal sinuses and mastoids clear. Orbital soft tissues unremarkable. Other: None IMPRESSION: Findings concerning for acute to subacute infarcts in the right cerebellar hemisphere. Old left occipital infarct. Diffuse cerebral atrophy. Electronically Signed   By: Rolm Baptise M.D.   On: 07/27/2017 15:05   Mr Brain Wo  Contrast  Result Date: 07/27/2017  CLINICAL DATA:  Dizziness, unsteady gait beginning yesterday. Hypertensive. Follow-up stroke. History of hypertension, off medication. EXAM: MRI HEAD WITHOUT CONTRAST TECHNIQUE: Multiplanar, multiecho pulse sequences of the brain and surrounding structures were obtained without intravenous contrast. COMPARISON:  CT HEAD July 27, 2017 at 1449 hours FINDINGS: BRAIN: Wedge-like reduced diffusion RIGHT cerebellum corresponding to CT abnormality. Additional patchy reduced diffusion in bilateral cerebella, RIGHT occipital lobe. Low ADC values RIGHT occipital lobe, lesser extent RIGHT cerebellum with faint T1 shortening consistent with laminar necrosis. Relatively normalized ADC values LEFT cerebellum. No susceptibility artifact to suggest hemorrhage. Multifocal LEFT occipital lobe encephalomalacia. Areas LEFT mesial frontal and parietal lobe encephalomalacia. Additional patchy supratentorial white matter FLAIR T2 hyperintensities. Ventricles and sulci are overall normal for patient's age. No midline shift, mass effect or masses. No abnormal extra-axial fluid collections. VASCULAR: Normal major intracranial vascular flow voids present at skull base. SKULL AND UPPER CERVICAL SPINE: No abnormal sellar expansion. No suspicious calvarial bone marrow signal. Craniocervical junction maintained. SINUSES/ORBITS: Paranasal sinus mucosal thickening. RIGHT anterior ethmoid mucosal retention cyst. Mastoid air cells are well aerated. The included ocular globes and orbital contents are non-suspicious. Status post bilateral ocular lens implants. OTHER: None. IMPRESSION: 1. 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 2. Old small LEFT PCA (LEFT occipital lobe) and old small LEFT ACA (LEFT frontoparietal lobes) territory infarcts. 3. Mild chronic small vessel ischemic disease. Electronically Signed   By: Elon Alas M.D.   On: 07/27/2017 22:02   US Carotid Bilateral  (at [facility] Only)  Result Date: 07/29/2017 CLINICAL DATA:  CVA. History of hypertension, diabetes and syncopal episode. EXAM: BILATERAL CAROTID DUPLEX ULTRASOUND TECHNIQUE: Pearline Cables scale imaging, color Doppler and duplex ultrasound were performed of bilateral carotid and vertebral arteries in the neck. COMPARISON:  None. FINDINGS: Criteria: Quantification of carotid stenosis is based on velocity parameters that correlate the residual internal carotid diameter with NASCET-based stenosis levels, using the diameter of the distal internal carotid lumen as the denominator for stenosis measurement. The following velocity measurements were obtained: RIGHT ICA:  76/26 cm/sec CCA:  725/36 cm/sec SYSTOLIC ICA/CCA RATIO:  0.7 DIASTOLIC ICA/CCA RATIO:  1.6 ECA:  139 cm/sec LEFT ICA:  113/34 cm/sec CCA:  644/03 cm/sec SYSTOLIC ICA/CCA RATIO:  0.9 DIASTOLIC ICA/CCA RATIO:  1.6 ECA:  189 cm/sec RIGHT CAROTID ARTERY: There is a minimal amount of mixed echogenic plaque within the right carotid bulb (image 15), extending to involve the origin and proximal aspects of the right internal carotid artery (image 22), not resulting in elevated peak systolic velocities within the right internal carotid artery to suggest a hemodynamically significant stenosis. RIGHT VERTEBRAL ARTERY:  Antegrade flow LEFT CAROTID ARTERY: There is a minimal to moderate amount of atherosclerotic plaque within the left carotid bulb (images 46 and 47), extending to involve the origin and proximal aspects of the left internal carotid artery (image 54), not resulting in elevated peak systolic velocities within the interrogated course of the left internal carotid artery to suggest a hemodynamically significant stenosis. LEFT VERTEBRAL ARTERY:  Antegrade flow IMPRESSION: Minimal to moderate amount of bilateral atherosclerotic plaque, left greater than right, not resulting in a hemodynamically significant stenosis within either internal carotid artery.  Electronically Signed   By: Sandi Mariscal M.D.   On: 07/29/2017 10:43      Management plans discussed with the patient, family and they are in agreement.  CODE STATUS:     Code Status Orders        Start  Ordered   07/27/17 1554  Full code  Continuous     07/27/17 1553    Code Status History    Date Active Date Inactive Code Status Order ID Comments User Context   This patient has a current code status but no historical code status.      TOTAL TIME TAKING CARE OF THIS PATIENT: 40 minutes.    Henreitta Leber M.D on 07/29/2017 at 1:30 PM  Between 7am to 6pm - Pager - [phone]  After 6pm go to [email or web address] - Proofreader  [facility]  Office  [phone]  CC: Primary care physician; Crecencio Mc, MD

## 2017-07-29 NOTE — Progress Notes (Addendum)
Pt had a short episode of bradycardia at 28 with a 2nd degree type 2 heart block.  Pt had a few minutes of nausea and a small amount of emesis.  HR quickly recovered, BP elevated, Hydralazine given according to orders.  MD notified.

## 2017-07-29 NOTE — Progress Notes (Signed)
Inpatient Diabetes Program Recommendations  AACE/ADA: New Consensus Statement on Inpatient Glycemic Control (2015)  Target Ranges:  Prepandial:   less than 140 mg/dL      Peak postprandial:   less than 180 mg/dL (1-2 hours)      Critically ill patients:  140 - 180 mg/dL   Lab Results  Component Value Date   GLUCAP 208 (H) 07/29/2017   HGBA1C 14.3 (H) 07/27/2017    Review of Glycemic Control  Results for JAZZALYNN, RHUDY (MRN [id number]) as of 07/29/2017 09:24  Ref. Range 07/27/2017 17:00 07/28/2017 11:43 07/28/2017 16:37 07/28/2017 21:30 07/29/2017 07:29  Glucose-Capillary Latest Ref Range: 65 - 99 mg/dL 247 (H) 198 (H) 115 (H) 273 (H) 208 (H)    Diabetes history: Type 2 DM Outpatient Diabetes medications: Glucotrol 5 mg in AM and 10 mg in PM, Metformin 850 mg bid, Januvia 50 mg daily Current orders for Inpatient glycemic control: Levemir 10 units bid, Novolog moderate correction scale 0-15 units tid, Novolog 0-5 units qhs  Inpatient Diabetes Program Recommendations: Fasting blood sugar 208 mg/dl- consider increasing Levemir to 11 units bid. Decrease Novolog correction to sensitive correction scale 0-9 units tid.   Gentry Fitz, RN, BA, MHA, CDE Diabetes Coordinator Inpatient Diabetes Program  [phone] (Team Pager) [phone] ([place]) 07/29/2017 9:30 AM

## 2017-07-30 NOTE — Telephone Encounter (Signed)
Transition Care Management Follow-up Telephone Call  How have you been since you were released from the hospital? Patient stated she feels better no syncope or dizziness at home.    Do you understand why you were in the hospital?Yes   Do you understand the discharge instrcutions? Yes   Items Reviewed:  Medications reviewed:yes    Allergies reviewed:yes    Dietary changes reviewed:yes    Referrals reviewed: yes   Functional Questionnaire:   Activities of Daily Living (ADLs):   She states they are independent in the following: Patient independent in all ADLS. States they require assistance with the following: No assist needed.   Any transportation issues/concerns?: no   Any patient concerns?No not at this time patient has medications for the month , patient needs assist with medication compliance.   Confirmed importance and date/time of follow-up visits scheduled: yes  Confirmed with patient if condition begins to worsen call PCP or go to the ER.  Patient was given the Call-a-Nurse line [phone]: yes

## 2017-08-03 ENCOUNTER — Ambulatory Visit: Payer: Medicare HMO | Admitting: Nurse Practitioner

## 2017-08-03 ENCOUNTER — Encounter: Payer: Self-pay | Admitting: Nurse Practitioner

## 2017-08-10 NOTE — Telephone Encounter (Signed)
No referral needed, called patient and scheduled visit with Chrys Racer on October 1st, 2018

## 2017-08-10 NOTE — Telephone Encounter (Signed)
Place referral.  I have no idea how

## 2017-08-10 NOTE — Telephone Encounter (Signed)
Spoke with pt and she stated that she is willing to see the pharmacist here in our office. Referral is needed so appt can be scheduled.

## 2017-08-13 ENCOUNTER — Ambulatory Visit: Payer: Medicare HMO | Admitting: Internal Medicine

## 2017-08-18 ENCOUNTER — Other Ambulatory Visit: Payer: Self-pay | Admitting: Internal Medicine

## 2017-08-30 ENCOUNTER — Ambulatory Visit: Payer: Medicare HMO | Admitting: Pharmacist

## 2017-09-01 ENCOUNTER — Ambulatory Visit: Payer: Medicare HMO | Admitting: Internal Medicine

## 2017-09-22 ENCOUNTER — Encounter: Payer: Self-pay | Admitting: Internal Medicine

## 2017-09-22 ENCOUNTER — Ambulatory Visit (INDEPENDENT_AMBULATORY_CARE_PROVIDER_SITE_OTHER): Payer: Medicare HMO | Admitting: Internal Medicine

## 2017-09-22 VITALS — BP 204/98 | HR 92 | Temp 98.1°F | Resp 15 | Ht 63.0 in | Wt 124.8 lb

## 2017-09-22 DIAGNOSIS — I63433 Cerebral infarction due to embolism of bilateral posterior cerebral arteries: Secondary | ICD-10-CM | POA: Diagnosis not present

## 2017-09-22 DIAGNOSIS — I10 Essential (primary) hypertension: Secondary | ICD-10-CM | POA: Diagnosis not present

## 2017-09-22 DIAGNOSIS — Z8673 Personal history of transient ischemic attack (TIA), and cerebral infarction without residual deficits: Secondary | ICD-10-CM

## 2017-09-22 DIAGNOSIS — Z23 Encounter for immunization: Secondary | ICD-10-CM | POA: Diagnosis not present

## 2017-09-22 DIAGNOSIS — E7800 Pure hypercholesterolemia, unspecified: Secondary | ICD-10-CM

## 2017-09-22 DIAGNOSIS — E1165 Type 2 diabetes mellitus with hyperglycemia: Secondary | ICD-10-CM

## 2017-09-22 DIAGNOSIS — E1122 Type 2 diabetes mellitus with diabetic chronic kidney disease: Secondary | ICD-10-CM | POA: Diagnosis not present

## 2017-09-22 DIAGNOSIS — N182 Chronic kidney disease, stage 2 (mild): Secondary | ICD-10-CM | POA: Diagnosis not present

## 2017-09-22 DIAGNOSIS — Z9114 Patient's other noncompliance with medication regimen: Secondary | ICD-10-CM | POA: Diagnosis not present

## 2017-09-22 DIAGNOSIS — IMO0002 Reserved for concepts with insufficient information to code with codable children: Secondary | ICD-10-CM

## 2017-09-22 DIAGNOSIS — I672 Cerebral atherosclerosis: Secondary | ICD-10-CM | POA: Diagnosis not present

## 2017-09-22 LAB — COMPREHENSIVE METABOLIC PANEL
ALK PHOS: 119 U/L — AB (ref 39–117)
ALT: 24 U/L (ref 0–35)
AST: 26 U/L (ref 0–37)
Albumin: 4.4 g/dL (ref 3.5–5.2)
BILIRUBIN TOTAL: 0.7 mg/dL (ref 0.2–1.2)
BUN: 31 mg/dL — AB (ref 6–23)
CO2: 25 mEq/L (ref 19–32)
Calcium: 9.5 mg/dL (ref 8.4–10.5)
Chloride: 102 mEq/L (ref 96–112)
Creatinine, Ser: 0.88 mg/dL (ref 0.40–1.20)
GFR: 82.12 mL/min (ref >60.00)
GLUCOSE: 129 mg/dL — AB (ref 70–99)
Potassium: 4.1 mEq/L (ref 3.5–5.1)
SODIUM: 139 meq/L (ref 135–145)
TOTAL PROTEIN: 8.5 g/dL — AB (ref 6.0–8.3)

## 2017-09-22 MED ORDER — CLONIDINE HCL 0.2 MG PO TABS: 0.2000 mg | ORAL_TABLET | Freq: Two times a day (BID) | ORAL | 5 refills | Status: DC

## 2017-09-22 MED ORDER — ATORVASTATIN CALCIUM 40 MG PO TABS: 40.0000 mg | ORAL_TABLET | Freq: Every day | ORAL | 5 refills | Status: DC

## 2017-09-22 NOTE — Progress Notes (Signed)
Subjective:  Patient ID: [name], female    DOB: 03/29/1951  Age: 66 y.o. MRN: [id number]  CC: The primary encounter diagnosis was Essential hypertension. 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  HPI [facility] [place] presents for follow up on medication noncompliance with  recent cerebellar infarct . Admitted to [facility] on August 28 with acute onset of vision changes and dizziness  secondary to  right occipital and bilateral cerebellar infarcts. Patient had been out of her medications for over a month and systolic bp was 355 at admission.  She was treated in the ICU with a Cleviprex gtt to lower her blood pressure to systolic 732, and seen by Dr Doy Mince, Neurology.  Etiology of stroke was considered to be embolic. Aspirin full strength was started, and statin was restarted as well   Carotid ultrasound: bilateral placque from bulb to ICA, hemoydnamically insignificant , L>R ECHOL concentric hypertrophy,  EF 60 to 65% .  It is not clear that a bubble study was done to rule out PFO  MRA: old left PCA and old Left ACA territory infarcts, acute/subacute right occipital lobe and bilateral cerebellar infarcts.  Has been taking clonidine 0.1 mg bid .  And amlodipine 10 mg qam.  Ran out of atorvastatin one month ago. taking amlodipine   Uncontrolled diabetes,  Last seen in May.  a1c done during august admission was over 14. Medications were reviewed with care management prior to discharge and she was reminded that her insulin would be cheaper without insurance (see CM notes) .    Has not been taking glipizide.  Using the NPH 10 units bid,   metformin bid,  And Januvia . astings have been 118 to 120   Evenings under 140   Lab Results  Component Value Date   HGBA1C 14.3 (H) 07/27/2017     Outpatient Medications Prior to Visit  Medication Sig Dispense Refill  . amLODipine (NORVASC) 10 MG tablet Take 1 tablet (10 mg total) by mouth daily. 30 tablet 1  . aspirin 81 MG tablet Take 1 tablet (81 mg total) by mouth daily. 30 tablet 1  . clopidogrel (PLAVIX) 75 MG tablet Take 1 tablet (75 mg total) by mouth daily. 30 tablet 1  . insulin NPH Human (HUMULIN N,NOVOLIN N) 100 UNIT/ML injection Inject 0.1 mLs (10 Units total) into the skin 2 (two) times daily before a meal. 10 mL 1  . metFORMIN (GLUCOPHAGE) 850 MG tablet TAKE 1 TABLET BY MOUTH TWICE A DAY WITH MEAL 180 tablet 0  . sitaGLIPtin (JANUVIA) 50 MG tablet Take 1 tablet (50 mg total) by mouth daily. 90 tablet 0  . cloNIDine (CATAPRES) 0.1 MG tablet Take 1 tablet (0.1 mg total) by mouth 2 (two) times daily. 60 tablet 1  . glipiZIDE (GLUCOTROL) 5 MG tablet TAKE 1 TABLET BY MOUTH EVERY MORNING AND TAKE 2 TABLETS BY MOUTH EVERY EVENING BEFORE MEALS (Patient not taking: Reported on 09/22/2017) 90 tablet 0  . atorvastatin (LIPITOR) 40 MG tablet Take 1 tablet (40 mg total) by mouth daily at 6 PM. (Patient not taking: Reported on 09/22/2017) 30 tablet 1  . glipiZIDE (GLUCOTROL) 5 MG tablet TAKE 1 TABLET BY MOUTH EVERY MORNING AND TAKE 2 TABLETS BY MOUTH EVERY EVENING  BEFORE MEALS (Patient not taking: Reported on 09/22/2017) 90 tablet 0   No facility-administered medications prior to visit.     Review of Systems;  Patient denies headache, fevers, malaise, unintentional weight loss, skin rash, eye pain, sinus congestion and sinus pain, sore throat, dysphagia,  hemoptysis , cough, dyspnea, wheezing, chest pain, palpitations, orthopnea, edema, abdominal pain, nausea, melena, diarrhea, constipation, flank pain, dysuria, hematuria, urinary  Frequency, nocturia, numbness, tingling, seizures,   Focal weakness, Loss of consciousness,  Tremor, insomnia, depression, anxiety, and suicidal ideation.      Objective:  BP (!) 204/98 (BP Location: Left Arm, Patient Position: Sitting, Cuff Size: Normal)   Pulse 92   Temp 98.1 F (36.7 C) (Oral)   Resp 15   Ht 5\' 3"  (1.6 m)   Wt 124 lb 12.8 oz (56.6 kg)   SpO2 98%   BMI 22.11 kg/m   BP Readings from Last 3 Encounters:  09/22/17 (!) 204/98  07/29/17 (!) 151/68  03/31/17 (!) 267/126    Wt Readings from Last 3 Encounters:  09/22/17 124 lb 12.8 oz (56.6 kg)  07/28/17 121 lb 9.6 oz (55.2 kg)  03/31/17 125 lb 12.8 oz (57.1 kg)    General appearance: alert, cooperative and appears stated age Ears: normal TM's and external ear canals both ears Throat: lips, mucosa, and tongue normal; teeth and gums normal Neck: no adenopathy, no carotid bruit, supple, symmetrical, trachea midline and thyroid not enlarged, symmetric, no tenderness/mass/nodules Back: symmetric, no curvature. ROM normal. No CVA tenderness. Lungs: clear to auscultation bilaterally Heart: regular rate and rhythm, S1, S2 normal, no murmur, click, rub or gallop Abdomen: soft, non-tender; bowel sounds normal; no masses,  no organomegaly Pulses: 2+ and symmetric Skin: Skin color, texture, turgor normal. No rashes or lesions Lymph nodes: Cervical, supraclavicular, and axillary nodes normal.  Lab Results  Component Value Date   HGBA1C 14.3 (H) 07/27/2017   HGBA1C 13.9 03/31/2017   HGBA1C 9.4 (H) 07/29/2016    Lab Results  Component Value Date   CREATININE 0.88 09/22/2017   CREATININE 1.03 (H) 07/28/2017   CREATININE 0.99 07/27/2017    Lab Results  Component Value Date   WBC 5.6 07/28/2017   HGB 12.8 07/28/2017   HCT 38.4 07/28/2017   PLT 151 07/28/2017   GLUCOSE 129 (H) 09/22/2017   CHOL 289 (H) 07/28/2017   TRIG 135 07/28/2017   HDL 50 07/28/2017   LDLDIRECT 145 (H) 07/29/2016   LDLCALC 212 (H) 07/28/2017   ALT 24 09/22/2017   AST 26 09/22/2017   NA  139 09/22/2017   K 4.1 09/22/2017   CL 102 09/22/2017   CREATININE 0.88 09/22/2017   BUN 31 (H) 09/22/2017   CO2 25 09/22/2017   TSH 2.350 01/18/2017   HGBA1C 14.3 (H) 07/27/2017   MICROALBUR 26.9 (H) 07/29/2016    US Carotid Bilateral (at [facility] Only)  Result Date: 07/29/2017 CLINICAL DATA:  CVA. History of hypertension, diabetes and syncopal episode. EXAM: BILATERAL CAROTID DUPLEX ULTRASOUND TECHNIQUE: Pearline Cables scale imaging, color Doppler and duplex ultrasound were performed of bilateral carotid and vertebral arteries in the neck. COMPARISON:  None. FINDINGS: Criteria: Quantification of carotid stenosis is based on velocity parameters that correlate the residual internal carotid diameter with NASCET-based stenosis levels, using the diameter of the distal internal carotid lumen as the denominator for stenosis measurement. The following velocity measurements were obtained: RIGHT ICA:  76/26 cm/sec CCA:  824/23 cm/sec SYSTOLIC ICA/CCA RATIO:  0.7 DIASTOLIC ICA/CCA RATIO:  1.6 ECA:  139 cm/sec LEFT ICA:  113/34 cm/sec CCA:  034/74 cm/sec SYSTOLIC ICA/CCA RATIO:  0.9 DIASTOLIC ICA/CCA RATIO:  1.6 ECA:  189 cm/sec RIGHT CAROTID ARTERY: There is a minimal amount of mixed echogenic plaque within the right carotid bulb (image 15), extending to involve the origin and proximal aspects of the right internal carotid artery (image 22), not resulting in elevated peak systolic velocities within the right internal carotid artery to suggest a hemodynamically significant stenosis. RIGHT VERTEBRAL ARTERY:  Antegrade flow LEFT CAROTID ARTERY: There is a minimal to moderate amount of atherosclerotic plaque within the left carotid bulb (images 46 and 47), extending to involve the origin and proximal aspects of the left internal carotid artery (image 54), not resulting in elevated peak systolic velocities within the interrogated course of the left internal carotid artery to suggest a hemodynamically significant stenosis.  LEFT VERTEBRAL ARTERY:  Antegrade flow IMPRESSION: Minimal to moderate amount of bilateral atherosclerotic plaque, left greater than right, not resulting in a hemodynamically significant stenosis within either internal carotid artery. Electronically Signed   By: Sandi Mariscal M.D.   On: 07/29/2017 10:43    Assessment & Plan:   Problem List Items Addressed This Visit    Cerebrovascular accident (CVA) ([place])    Right occipital and bilateral cerebellar infarcts . She has no neurologic deficits on exam.  She is at high risk for recurrence given continued medication  noncompliance. Referring to Gray Bernhardt, PharmD for assistance in managing her medications and cost effectively as possible.       Relevant Medications   atorvastatin (LIPITOR) 40 MG tablet   cloNIDine (CATAPRES) 0.2 MG tablet   Cerebrovascular disease, arteriosclerotic, post-stroke   Relevant Medications   atorvastatin (LIPITOR) 40 MG tablet   cloNIDine (CATAPRES) 0.2 MG tablet   Hyperlipidemia    Advised to resume statin given atherosclerosis noted during stroke workup including noncritical /hemodynamically insignificant carotid stenosis.  Lab Results  Component Value Date   CHOL 289 (H) 07/28/2017   HDL 50 07/28/2017   LDLCALC 212 (H) 07/28/2017   LDLDIRECT 145 (H) 07/29/2016   TRIG 135 07/28/2017   CHOLHDL 5.8 07/28/2017         Relevant Medications   atorvastatin (LIPITOR) 40 MG tablet   cloNIDine (CATAPRES) 0.2 MG tablet   Hypertension - Primary    Uncontrolled today,  Will increase clonidine to 0.2 mg bid.       Relevant Medications   atorvastatin (LIPITOR) 40 MG tablet   cloNIDine (CATAPRES) 0.2 MG tablet   Other Relevant Orders   Comprehensive metabolic panel (Completed)   Noncompliance with medication treatment due to underuse of medication    Ongoing, despite use of generic medications.        Uncontrolled diabetes mellitus with stage 2 chronic kidney disease ([place])    Secondary to medication  noncompliance historically.  Continue januvia, metformin and insulin.  Overdue for eye exam.  Lab Results  Component Value Date   HGBA1C 14.3 (H) 07/27/2017         Relevant Medications   atorvastatin (LIPITOR) 40 MG tablet    Other Visit Diagnoses    Encounter for immunization       Relevant Orders   Flu vaccine HIGH DOSE PF (Completed)     A total of 40 minutes was spent with patient more than half of which was spent in counseling patient on the above mentioned issues , reviewing and explaining recent labs and imaging studies done, and coordination of care.  I have changed Ms. Lebeda's cloNIDine. I am also having her maintain her amLODipine, glipiZIDE, metFORMIN, sitaGLIPtin, aspirin, clopidogrel, insulin NPH Human, and atorvastatin.  Meds ordered this encounter  Medications  . atorvastatin (LIPITOR) 40 MG tablet    Sig: Take 1 tablet (40 mg total) by mouth daily at 6 PM.    Dispense:  30 tablet    Refill:  5  . cloNIDine (CATAPRES) 0.2 MG tablet    Sig: Take 1 tablet (0.2 mg total) by mouth 2 (two) times daily.    Dispense:  60 tablet    Refill:  5    Medications Discontinued During This Encounter  Medication Reason  . glipiZIDE (GLUCOTROL) 5 MG tablet Duplicate  . atorvastatin (LIPITOR) 40 MG tablet Reorder  . cloNIDine (CATAPRES) 0.1 MG tablet Reorder    Follow-up: No Follow-up on file.   Crecencio Mc, MD

## 2017-09-22 NOTE — Patient Instructions (Signed)
   I have increased your clonidine dose to 0.2 mg twice daily.  If it makes you too dizzy,  Cut the dose in half and let me know.  Continue amlodipine 10 mg daily  Go to [facility] in a week and check BP .  Call reading to the office    I have refilled the atorvastatin for cholesterol  You do NOT need to resume the glipizide if you blood sugars are consistently < 130 in the morning and < 160 in the evening  Continue Januvia, metformin and  insulin

## 2017-09-25 DIAGNOSIS — I672 Cerebral atherosclerosis: Secondary | ICD-10-CM | POA: Insufficient documentation

## 2017-09-25 DIAGNOSIS — Z8673 Personal history of transient ischemic attack (TIA), and cerebral infarction without residual deficits: Secondary | ICD-10-CM

## 2017-09-25 DIAGNOSIS — Z9114 Patient's other noncompliance with medication regimen: Secondary | ICD-10-CM | POA: Insufficient documentation

## 2017-09-25 DIAGNOSIS — Z91148 Patient's other noncompliance with medication regimen for other reason: Secondary | ICD-10-CM | POA: Insufficient documentation

## 2017-09-25 NOTE — Assessment & Plan Note (Signed)
Advised to resume statin given atherosclerosis noted during stroke workup including noncritical /hemodynamically insignificant carotid stenosis.  Lab Results  Component Value Date   CHOL 289 (H) 07/28/2017   HDL 50 07/28/2017   LDLCALC 212 (H) 07/28/2017   LDLDIRECT 145 (H) 07/29/2016   TRIG 135 07/28/2017   CHOLHDL 5.8 07/28/2017

## 2017-09-25 NOTE — Assessment & Plan Note (Signed)
Secondary to medication noncompliance historically.  Continue januvia, metformin and insulin.  Overdue for eye exam.  Lab Results  Component Value Date   HGBA1C 14.3 (H) 07/27/2017

## 2017-09-25 NOTE — Assessment & Plan Note (Signed)
Uncontrolled today,  Will increase clonidine to 0.2 mg bid.

## 2017-09-25 NOTE — Assessment & Plan Note (Signed)
Right occipital and bilateral cerebellar infarcts . She has no neurologic deficits on exam.  She is at high risk for recurrence given continued medication  noncompliance. Referring to Gray Bernhardt, PharmD for assistance in managing her medications and cost effectively as possible.

## 2017-09-25 NOTE — Assessment & Plan Note (Signed)
Ongoing, despite use of generic medications.

## 2017-09-27 ENCOUNTER — Ambulatory Visit: Payer: Medicare HMO | Admitting: Pharmacist

## 2017-09-28 ENCOUNTER — Telehealth: Payer: Self-pay | Admitting: Pharmacist

## 2017-09-28 NOTE — Telephone Encounter (Signed)
Called pt at request of Dr. Derrel Nip to schedule for Rx clinic appnt for DM management and affordability. Patient declines appointment at this time as she does not have reliable transportation lined up. Explained that I can see her on Mondays. Patient took down my number and states she will call me as soon as she figures out transportation. Will follow up by 10/25/2017 if I do not hear from her.   Carlean Jews, Pharm.D. PGY2 Ambulatory Care Pharmacy Resident Phone: [phone]

## 2017-09-28 NOTE — Telephone Encounter (Signed)
-----   Message from Crecencio Mc, MD sent at 09/25/2017  5:51 PM EDT ----- Can you see this patient?  Historically noncompliant with meds due to cost, not sure how I can make her burden any lighter

## 2017-11-08 ENCOUNTER — Ambulatory Visit: Payer: Medicare HMO | Admitting: Internal Medicine

## 2017-12-06 ENCOUNTER — Telehealth: Payer: Self-pay | Admitting: Internal Medicine

## 2017-12-06 MED ORDER — CLONIDINE HCL 0.2 MG PO TABS: 0.2000 mg | ORAL_TABLET | Freq: Two times a day (BID) | ORAL | 5 refills | Status: DC

## 2017-12-06 MED ORDER — SITAGLIPTIN PHOSPHATE 50 MG PO TABS: 50.0000 mg | ORAL_TABLET | Freq: Every day | ORAL | 0 refills | Status: DC

## 2017-12-06 MED ORDER — METFORMIN HCL 850 MG PO TABS: ORAL_TABLET | ORAL | 0 refills | Status: DC

## 2017-12-06 NOTE — Telephone Encounter (Signed)
Copied from [place]. Topic: Quick Communication - Rx Refill/Question >> Dec 06, 2017  1:18 PM Robina Ade, Helene Kelp D wrote: Has the patient contacted their pharmacy? Yes (Agent: If no, request that the patient contact the pharmacy for the refill.) Preferred Pharmacy (with phone number or street name): [facility] (N), [place] [address] Agent: Please be advised that RX refills may take up to 3 business days. We ask that you follow-up with your pharmacy. Patient needs refill on her sitaGLIPtin (JANUVIA) 50 MG tablet, metFORMIN (GLUCOPHAGE) 850 MG tablet and cloNIDine (CATAPRES) 0.2 MG tablet.

## 2017-12-06 NOTE — Telephone Encounter (Signed)
Januvia LR: 07/29/17 #90 0 refills OV 09/22/17  Metformin LR: 07/29/17 #180 tablets OV 09/22/17  Clonidine LR: 10/24/1/ #60 tab 5 refills OV: 09/22/17

## 2017-12-07 ENCOUNTER — Other Ambulatory Visit: Payer: Self-pay

## 2017-12-07 MED ORDER — SITAGLIPTIN PHOSPHATE 50 MG PO TABS: 50.0000 mg | ORAL_TABLET | Freq: Every day | ORAL | 0 refills | Status: DC

## 2017-12-07 MED ORDER — METFORMIN HCL 850 MG PO TABS: ORAL_TABLET | ORAL | 0 refills | Status: DC

## 2017-12-07 MED ORDER — CLONIDINE HCL 0.2 MG PO TABS: 0.2000 mg | ORAL_TABLET | Freq: Two times a day (BID) | ORAL | 5 refills | Status: DC

## 2017-12-07 NOTE — Telephone Encounter (Addendum)
Medications were printed in office by  [facility]. / Will send a note to office to see if medications can be faxed over to the [place] on file [phone]

## 2017-12-07 NOTE — Telephone Encounter (Signed)
Reordered all 3 prescriptions and sent them electronically. Patient is aware.

## 2017-12-13 ENCOUNTER — Ambulatory Visit: Payer: Medicare HMO | Admitting: Internal Medicine

## 2018-01-24 ENCOUNTER — Ambulatory Visit: Payer: Medicare HMO | Admitting: Internal Medicine

## 2018-01-24 ENCOUNTER — Encounter: Payer: Self-pay | Admitting: Internal Medicine

## 2018-01-24 VITALS — BP 222/100 | HR 92 | Temp 97.5°F | Resp 15 | Ht 63.0 in | Wt 130.6 lb

## 2018-01-24 DIAGNOSIS — N182 Chronic kidney disease, stage 2 (mild): Secondary | ICD-10-CM | POA: Diagnosis not present

## 2018-01-24 DIAGNOSIS — E1122 Type 2 diabetes mellitus with diabetic chronic kidney disease: Secondary | ICD-10-CM

## 2018-01-24 DIAGNOSIS — I10 Essential (primary) hypertension: Secondary | ICD-10-CM | POA: Diagnosis not present

## 2018-01-24 DIAGNOSIS — E7800 Pure hypercholesterolemia, unspecified: Secondary | ICD-10-CM | POA: Diagnosis not present

## 2018-01-24 DIAGNOSIS — E1165 Type 2 diabetes mellitus with hyperglycemia: Secondary | ICD-10-CM

## 2018-01-24 DIAGNOSIS — IMO0002 Reserved for concepts with insufficient information to code with codable children: Secondary | ICD-10-CM

## 2018-01-24 LAB — COMPREHENSIVE METABOLIC PANEL
ALK PHOS: 126 U/L — AB (ref 39–117)
ALT: 18 U/L (ref 0–35)
AST: 19 U/L (ref 0–37)
Albumin: 3.9 g/dL (ref 3.5–5.2)
BUN: 34 mg/dL — ABNORMAL HIGH (ref 6–23)
CALCIUM: 9.5 mg/dL (ref 8.4–10.5)
CO2: 27 mEq/L (ref 19–32)
CREATININE: 1.2 mg/dL (ref 0.40–1.20)
Chloride: 100 mEq/L (ref 96–112)
GFR: 57.36 mL/min — AB (ref >60.00)
Glucose, Bld: 180 mg/dL — ABNORMAL HIGH (ref 70–99)
Potassium: 4.5 mEq/L (ref 3.5–5.1)
Sodium: 139 mEq/L (ref 135–145)
TOTAL PROTEIN: 7.9 g/dL (ref 6.0–8.3)
Total Bilirubin: 0.5 mg/dL (ref 0.2–1.2)

## 2018-01-24 LAB — HEMOGLOBIN A1C: Hgb A1c MFr Bld: 7.8 % — ABNORMAL HIGH (ref 4.6–6.5)

## 2018-01-24 LAB — MICROALBUMIN / CREATININE URINE RATIO
CREATININE, U: 177 mg/dL
MICROALB/CREAT RATIO: 186 mg/g — AB (ref 0.0–30.0)
Microalb, Ur: 329.2 mg/dL — ABNORMAL HIGH (ref 0.0–1.9)

## 2018-01-24 LAB — LIPID PANEL
CHOL/HDL RATIO: 5
Cholesterol: 298 mg/dL — ABNORMAL HIGH (ref 0–200)
HDL: 59 mg/dL (ref >39.00)
LDL Cholesterol: 223 mg/dL — ABNORMAL HIGH (ref 0–99)
NONHDL: 239.21
Triglycerides: 80 mg/dL (ref 0.0–149.0)
VLDL: 16 mg/dL (ref 0.0–40.0)

## 2018-01-24 MED ORDER — AMLODIPINE BESYLATE 10 MG PO TABS: 10.0000 mg | ORAL_TABLET | Freq: Every day | ORAL | 11 refills | Status: DC

## 2018-01-24 MED ORDER — SITAGLIPTIN PHOSPHATE 50 MG PO TABS: 50.0000 mg | ORAL_TABLET | Freq: Every day | ORAL | 2 refills | Status: DC

## 2018-01-24 MED ORDER — INSULIN NPH (HUMAN) (ISOPHANE) 100 UNIT/ML ~~LOC~~ SUSP: SUBCUTANEOUS | 4 refills | Status: DC

## 2018-01-24 MED ORDER — LISINOPRIL-HYDROCHLOROTHIAZIDE 20-25 MG PO TABS: 1.0000 | ORAL_TABLET | Freq: Every day | ORAL | 3 refills | Status: DC

## 2018-01-24 MED ORDER — METFORMIN HCL 850 MG PO TABS: ORAL_TABLET | ORAL | 3 refills | Status: DC

## 2018-01-24 NOTE — Progress Notes (Signed)
Subjective:  Patient ID: Julie Dominguez, female    DOB: [birth year]  Age: 67 y.o. 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 Diagnoses of Essential hypertension and Pure hypercholesterolemia were also pertinent to this visit.  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 Patient was last seen in October and clonidine dose was increased to 0.2 mg bid   .  She was advised to have BP checked in one week at [facility] and report readings,  But had not done so.  She has not taken any meds in a week bc she states that her pharmacy told her that she had requested a refill too early   Refill history as obtained by her pharmacy indicates that she has not filled the amlodipine or atorvastatin ror the glipizide, and the clonidine is in the process of refilling at 0.2 mg dose.  .    Says she is taking 10 units of  NPH insulin bid.  Sugars "pretty good"  But did not bring log  From memory states that randoms sugars are   Running 170 to 180 during the day, sometimes over 200 ,   and 80 to 90 in the morning .   Wants something to help her relax at night     Outpatient Medications Prior to Visit  Medication Sig Dispense Refill  . aspirin 81 MG tablet Take 1 tablet (81 mg total) by mouth daily. 30 tablet 1  . cloNIDine (CATAPRES) 0.2 MG tablet Take 1 tablet (0.2 mg total) by mouth 2 (two) times daily. 60 tablet 5  . amLODipine (NORVASC) 10 MG tablet Take 1 tablet (10 mg total) by mouth daily. 30 tablet 1  . atorvastatin (LIPITOR) 40 MG tablet Take 1 tablet (40 mg total) by mouth daily at 6 PM. 30 tablet 5  . clopidogrel (PLAVIX) 75 MG tablet Take 1 tablet (75 mg total) by mouth daily. 30 tablet 1  . glipiZIDE (GLUCOTROL) 5 MG tablet TAKE 1 TABLET BY MOUTH EVERY  MORNING AND TAKE 2 TABLETS BY MOUTH EVERY EVENING BEFORE MEALS 90 tablet 0  . insulin NPH Human (HUMULIN N,NOVOLIN N) 100 UNIT/ML injection Inject 0.1 mLs (10 Units total) into the skin 2 (two) times daily before a meal. 10 mL 1  . metFORMIN (GLUCOPHAGE) 850 MG tablet TAKE 1 TABLET BY MOUTH TWICE A DAY WITH MEAL 180 tablet 0  . sitaGLIPtin (JANUVIA) 50 MG tablet Take 1 tablet (50 mg total) by mouth daily. 90 tablet 0   No facility-administered medications prior to visit.     Review of Systems;  Patient denies headache, fevers, malaise, unintentional weight loss, skin rash, eye pain, sinus congestion and sinus pain, sore throat, dysphagia,  hemoptysis , cough, dyspnea, wheezing, chest pain, palpitations, orthopnea, edema, abdominal pain, nausea, melena, diarrhea, constipation, flank pain, dysuria, hematuria, urinary  Frequency, nocturia, numbness, tingling, seizures,  Focal weakness, Loss of consciousness,  Tremor, insomnia, depression, anxiety, and suicidal ideation.      Objective:  BP (!) 222/100 (BP Location: Left Arm, Patient Position: Sitting, Cuff Size: Normal)   Pulse 92   Temp (!) 97.5 F (36.4 C) (Oral)   Resp 15   Ht 5\' 3"  (1.6 m)   Wt 130 lb 9.6 oz (59.2 kg)   SpO2 98%   BMI  23.13 kg/m   BP Readings from Last 3 Encounters:  01/24/18 (!) 222/100  09/22/17 (!) 204/98  07/29/17 (!) 151/68    Wt Readings from Last 3 Encounters:  01/24/18 130 lb 9.6 oz (59.2 kg)  09/22/17 124 lb 12.8 oz (56.6 kg)  07/28/17 121 lb 9.6 oz (55.2 kg)    General appearance: alert, cooperative and appears stated age Ears: normal TM's and external ear canals both ears Throat: lips, mucosa, and tongue normal; teeth and gums normal Neck: no adenopathy, no carotid bruit, supple, symmetrical, trachea midline and thyroid not enlarged, symmetric, no tenderness/mass/nodules Back: symmetric, no curvature. ROM normal. No CVA tenderness. Lungs: clear to auscultation bilaterally Heart: regular rate  and rhythm, S1, S2 normal, no murmur, click, rub or gallop Abdomen: soft, non-tender; bowel sounds normal; no masses,  no organomegaly Pulses: 2+ and symmetric Skin: Skin color, texture, turgor normal. No rashes or lesions Lymph nodes: Cervical, supraclavicular, and axillary nodes normal.  Lab Results  Component Value Date   HGBA1C 7.8 (H) 01/24/2018   HGBA1C 14.3 (H) 07/27/2017   HGBA1C 13.9 03/31/2017    Lab Results  Component Value Date   CREATININE 1.20 01/24/2018   CREATININE 0.88 09/22/2017   CREATININE 1.03 (H) 07/28/2017    Lab Results  Component Value Date   WBC 5.6 07/28/2017   HGB 12.8 07/28/2017   HCT 38.4 07/28/2017   PLT 151 07/28/2017   GLUCOSE 180 (H) 01/24/2018   CHOL 298 (H) 01/24/2018   TRIG 80.0 01/24/2018   HDL 59.00 01/24/2018   LDLDIRECT 145 (H) 07/29/2016   LDLCALC 223 (H) 01/24/2018   ALT 18 01/24/2018   AST 19 01/24/2018   NA 139 01/24/2018   K 4.5 01/24/2018   CL 100 01/24/2018   CREATININE 1.20 01/24/2018   BUN 34 (H) 01/24/2018   CO2 27 01/24/2018   TSH 2.350 01/18/2017   HGBA1C 7.8 (H) 01/24/2018   MICROALBUR 329.2 (H) 01/24/2018    US Carotid Bilateral (at [facility] Only)  Result Date: 07/29/2017 CLINICAL DATA:  CVA. History of hypertension, diabetes and syncopal episode. EXAM: BILATERAL CAROTID DUPLEX ULTRASOUND TECHNIQUE: Pearline Cables scale imaging, color Doppler and duplex ultrasound were performed of bilateral carotid and vertebral arteries in the neck. COMPARISON:  None. FINDINGS: Criteria: Quantification of carotid stenosis is based on velocity parameters that correlate the residual internal carotid diameter with NASCET-based stenosis levels, using the diameter of the distal internal carotid lumen as the denominator for stenosis measurement. The following velocity measurements were obtained: RIGHT ICA:  76/26 cm/sec CCA:  093/26 cm/sec SYSTOLIC ICA/CCA RATIO:  0.7 DIASTOLIC ICA/CCA RATIO:  1.6 ECA:  139 cm/sec LEFT ICA:  113/34 cm/sec CCA:   712/45 cm/sec SYSTOLIC ICA/CCA RATIO:  0.9 DIASTOLIC ICA/CCA RATIO:  1.6 ECA:  189 cm/sec RIGHT CAROTID ARTERY: There is a minimal amount of mixed echogenic plaque within the right carotid bulb (image 15), extending to involve the origin and proximal aspects of the right internal carotid artery (image 22), not resulting in elevated peak systolic velocities within the right internal carotid artery to suggest a hemodynamically significant stenosis. RIGHT VERTEBRAL ARTERY:  Antegrade flow LEFT CAROTID ARTERY: There is a minimal to moderate amount of atherosclerotic plaque within the left carotid bulb (images 46 and 47), extending to involve the origin and proximal aspects of the left internal carotid artery (image 54), not resulting in elevated peak systolic velocities within the interrogated course of the left internal carotid artery to suggest a hemodynamically significant stenosis. LEFT  VERTEBRAL ARTERY:  Antegrade flow IMPRESSION: Minimal to moderate amount of bilateral atherosclerotic plaque, left greater than right, not resulting in a hemodynamically significant stenosis within either internal carotid artery. Electronically Signed   By: Sandi Mariscal M.D.   On: 07/29/2017 10:43    Assessment & Plan:   Problem List Items Addressed This Visit    Hypertension    Historically and currently uncontrolled due to medication noncompliance.  Was previously maxed out on 5 medications,  No RAS by prior workup.  Continue amlodipine and clonidine.  Adding lisinopril/hctz.  rtc one month.  Lab Results  Component Value Date   CREATININE 1.20 01/24/2018   Lab Results  Component Value Date   NA 139 01/24/2018   K 4.5 01/24/2018   CL 100 01/24/2018   CO2 27 01/24/2018         Relevant Medications   amLODipine (NORVASC) 10 MG tablet   lisinopril-hydrochlorothiazide (PRINZIDE,ZESTORETIC) 20-25 MG tablet   atorvastatin (LIPITOR) 40 MG tablet   Hyperlipidemia    Not taking atorvastatin per pharmacy despite  patent's report.  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  Does not need glipizide..  Adding lisinopril for proteinuria   Lab Results  Component Value Date   HGBA1C 7.8 (H) 01/24/2018   Lab Results  Component Value Date   MICROALBUR 329.2 (H) 01/24/2018   Lab Results  Component Value Date   CREATININE 1.20 01/24/2018         Relevant Medications   lisinopril-hydrochlorothiazide (PRINZIDE,ZESTORETIC) 20-25 MG tablet   sitaGLIPtin (JANUVIA) 50 MG tablet   metFORMIN (GLUCOPHAGE) 850 MG tablet   insulin NPH Human (HUMULIN N,NOVOLIN N) 100 UNIT/ML injection   atorvastatin (LIPITOR) 40 MG tablet   Other Relevant Orders   Hemoglobin A1c (Completed)   Comprehensive metabolic panel (Completed)   Microalbumin / creatinine urine ratio (Completed)   Lipid panel (Completed)      I have discontinued Ruthia D. Schlack's glipiZIDE and clopidogrel. I am also having her start on lisinopril-hydrochlorothiazide. Additionally, I am having her maintain her aspirin, cloNIDine, amLODipine, sitaGLIPtin, metFORMIN, insulin NPH Human, and atorvastatin.  Meds ordered this encounter  Medications  . amLODipine (NORVASC) 10 MG tablet    Sig: Take 1 tablet (10 mg total) by mouth daily.    Dispense:  30 tablet    Refill:  11  . lisinopril-hydrochlorothiazide (PRINZIDE,ZESTORETIC) 20-25 MG tablet    Sig: Take 1 tablet by mouth daily.     Dispense:  90 tablet    Refill:  3  . DISCONTD: insulin NPH Human (HUMULIN N,NOVOLIN N) 100 UNIT/ML injection    Sig: 14 UNITS IN THE AM.  8 UNITS IN THE PM    Dispense:  10 mL    Refill:  4  . sitaGLIPtin (JANUVIA) 50 MG tablet    Sig: Take 1 tablet (50 mg total) by mouth daily.  Dispense:  90 tablet    Refill:  2  . metFORMIN (GLUCOPHAGE) 850 MG tablet    Sig: TAKE 1 TABLET BY MOUTH TWICE A DAY WITH MEAL    Dispense:  180 tablet    Refill:  3  . insulin NPH Human (HUMULIN N,NOVOLIN N) 100 UNIT/ML injection    Sig: 14 UNITS IN THE AM.  8 UNITS IN THE PM    Dispense:  10 mL    Refill:  4  . atorvastatin (LIPITOR) 40 MG tablet    Sig: Take 1 tablet (40 mg total) by mouth daily at 6 PM.    Dispense:  30 tablet    Refill:  5    Medications Discontinued During This Encounter  Medication Reason  . amLODipine (NORVASC) 10 MG tablet Reorder  . glipiZIDE (GLUCOTROL) 5 MG tablet   . atorvastatin (LIPITOR) 40 MG tablet   . clopidogrel (PLAVIX) 75 MG tablet   . insulin NPH Human (HUMULIN N,NOVOLIN N) 100 UNIT/ML injection   . sitaGLIPtin (JANUVIA) 50 MG tablet Reorder  . metFORMIN (GLUCOPHAGE) 850 MG tablet Reorder  . insulin NPH Human (HUMULIN N,NOVOLIN N) 100 UNIT/ML injection Reorder    Follow-up: Return for BP,  DIABETES CHECK .   Crecencio Mc, MD

## 2018-01-24 NOTE — Patient Instructions (Addendum)
FOR YOUR DIABETES:   Increase your morning dose of insulin to 14 units    And decrease evening  dose to 8 units   Goal is to have your  Morning blood  sugar 120    And your daytime sugars  140  to 160   Continue to check a pre breakfast sugar and pre dinner sugar   Continue  Metformin and Januvia    FOR YOUR BLOOD PRESSURE :  Resume amlodipine 10 mg once  daily  CONTINUE CLONIDINE 0.2 mg  EVERY 12 HOURS  I am ADDING LISINOPRIL/HCTZ  ONCE TABLET DAILY IN MORNING    RETURN IN ONE MONTH FOR BP CHECK

## 2018-01-25 MED ORDER — ATORVASTATIN CALCIUM 40 MG PO TABS: 40.0000 mg | ORAL_TABLET | Freq: Every day | ORAL | 5 refills | Status: AC

## 2018-01-25 NOTE — Assessment & Plan Note (Signed)
Not taking atorvastatin per pharmacy despite patent's report.  Advised to resume atorvastatin   Lab Results  Component Value Date   CHOL 298 (H) 01/24/2018   HDL 59.00 01/24/2018   LDLCALC 223 (H) 01/24/2018   LDLDIRECT 145 (H) 07/29/2016   TRIG 80.0 01/24/2018   CHOLHDL 5 01/24/2018

## 2018-01-25 NOTE — Assessment & Plan Note (Addendum)
Today we increased her nph dose in morning to 14 and reduced evening dose to 8 units continue metformin and januvia.  Does not need glipizide..  Adding lisinopril for proteinuria   Lab Results  Component Value Date   HGBA1C 7.8 (H) 01/24/2018   Lab Results  Component Value Date   MICROALBUR 329.2 (H) 01/24/2018   Lab Results  Component Value Date   CREATININE 1.20 01/24/2018

## 2018-01-25 NOTE — Assessment & Plan Note (Signed)
Historically and currently uncontrolled due to medication noncompliance.  Was previously maxed out on 5 medications,  No RAS by prior workup.  Continue amlodipine and clonidine.  Adding lisinopril/hctz.  rtc one month.  Lab Results  Component Value Date   CREATININE 1.20 01/24/2018   Lab Results  Component Value Date   NA 139 01/24/2018   K 4.5 01/24/2018   CL 100 01/24/2018   CO2 27 01/24/2018

## 2018-02-24 ENCOUNTER — Ambulatory Visit: Payer: Medicare HMO | Admitting: Internal Medicine

## 2018-04-01 ENCOUNTER — Ambulatory Visit: Payer: Medicare HMO | Admitting: Internal Medicine

## 2018-04-28 ENCOUNTER — Ambulatory Visit: Payer: Medicare HMO | Admitting: Internal Medicine

## 2018-04-28 ENCOUNTER — Encounter: Payer: Self-pay | Admitting: Internal Medicine

## 2018-04-28 VITALS — BP 260/114 | HR 89 | Temp 98.2°F | Resp 14 | Ht 63.0 in | Wt 130.2 lb

## 2018-04-28 DIAGNOSIS — R748 Abnormal levels of other serum enzymes: Secondary | ICD-10-CM

## 2018-04-28 DIAGNOSIS — E1122 Type 2 diabetes mellitus with diabetic chronic kidney disease: Secondary | ICD-10-CM | POA: Diagnosis not present

## 2018-04-28 DIAGNOSIS — E1165 Type 2 diabetes mellitus with hyperglycemia: Secondary | ICD-10-CM

## 2018-04-28 DIAGNOSIS — N182 Chronic kidney disease, stage 2 (mild): Secondary | ICD-10-CM

## 2018-04-28 DIAGNOSIS — I10 Essential (primary) hypertension: Secondary | ICD-10-CM

## 2018-04-28 DIAGNOSIS — IMO0002 Reserved for concepts with insufficient information to code with codable children: Secondary | ICD-10-CM

## 2018-04-28 LAB — COMPREHENSIVE METABOLIC PANEL
ALT: 23 U/L (ref 0–35)
AST: 26 U/L (ref 0–37)
Albumin: 4.2 g/dL (ref 3.5–5.2)
Alkaline Phosphatase: 132 U/L — ABNORMAL HIGH (ref 39–117)
BUN: 29 mg/dL — ABNORMAL HIGH (ref 6–23)
CALCIUM: 9.4 mg/dL (ref 8.4–10.5)
CHLORIDE: 102 meq/L (ref 96–112)
CO2: 27 mEq/L (ref 19–32)
CREATININE: 1.08 mg/dL (ref 0.40–1.20)
GFR: 64.72 mL/min (ref >60.00)
Glucose, Bld: 208 mg/dL — ABNORMAL HIGH (ref 70–99)
Potassium: 4.1 mEq/L (ref 3.5–5.1)
Sodium: 140 mEq/L (ref 135–145)
Total Bilirubin: 0.5 mg/dL (ref 0.2–1.2)
Total Protein: 8.5 g/dL — ABNORMAL HIGH (ref 6.0–8.3)

## 2018-04-28 LAB — LIPID PANEL
CHOLESTEROL: 184 mg/dL (ref 0–200)
HDL: 60.5 mg/dL (ref >39.00)
LDL CALC: 103 mg/dL — AB (ref 0–99)
NonHDL: 123.4
Total CHOL/HDL Ratio: 3
Triglycerides: 102 mg/dL (ref 0.0–149.0)
VLDL: 20.4 mg/dL (ref 0.0–40.0)

## 2018-04-28 LAB — HEMOGLOBIN A1C: Hgb A1c MFr Bld: 8.2 % — ABNORMAL HIGH (ref 4.6–6.5)

## 2018-04-28 MED ORDER — AMLODIPINE BESYLATE 10 MG PO TABS: 10.0000 mg | ORAL_TABLET | Freq: Every day | ORAL | 3 refills | Status: DC

## 2018-04-28 MED ORDER — CARVEDILOL 3.125 MG PO TABS: 3.1250 mg | ORAL_TABLET | Freq: Two times a day (BID) | ORAL | 11 refills | Status: DC

## 2018-04-28 NOTE — Progress Notes (Signed)
Subjective:  Patient ID: Julie Dominguez, female    DOB: 11-25-1951  Age: 68 y.o. 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 A diagnosis of Essential hypertension was also pertinent to this visit.  HPI Julie Dominguez presents for FOLLOW UP ON UNCONTROLLED HYPERTENSION AND DIABETES  Patient states that she has taken her amlodipine,  Clonidine and lisinopril  This mornign  About 2 hours ago.   States that she has not missed any doses .  Does not check her pressure at home, doe not own a meter.   Call placed to [facility],  Amlodipine last refilled on Feb 25 for #30,  clonidine 4/30 for #60 and lisinopriil hct on April 30 for #30  Blood sugars are doing good"  Morning BS 80 to 100 .  But in the low 200 during the day  Taking 10 units in the am and 10 units at night f NPH     Lab Results  Component Value Date   HGBA1C 8.2 (H) 04/28/2018     Outpatient Medications Prior to Visit  Medication Sig Dispense Refill  . aspirin 81 MG tablet Take 1 tablet (81 mg total) by mouth daily. 30 tablet 1  . atorvastatin (LIPITOR) 40 MG tablet Take 1 tablet (40 mg total) by mouth daily at 6 PM. 30 tablet 5  . cloNIDine (CATAPRES) 0.2 MG tablet Take 1 tablet (0.2 mg total) by mouth 2 (two) times daily. 60 tablet 5  . insulin NPH Human (HUMULIN N,NOVOLIN N) 100 UNIT/ML injection 14 UNITS IN THE AM.  8 UNITS IN THE PM 10 mL 4  . lisinopril-hydrochlorothiazide (PRINZIDE,ZESTORETIC) 20-25 MG tablet Take 1 tablet by mouth daily. 90 tablet 3  . metFORMIN (GLUCOPHAGE) 850 MG tablet TAKE 1 TABLET BY MOUTH TWICE A DAY WITH MEAL 180 tablet 3  . sitaGLIPtin (JANUVIA) 50 MG tablet Take 1 tablet (50 mg total) by mouth daily. 90 tablet 2  . amLODipine (NORVASC) 10 MG tablet Take 1 tablet (10 mg total) by mouth daily. 30 tablet 11   No facility-administered medications prior to visit.     Review of  Systems;  Patient denies headache, fevers, malaise, unintentional weight loss, skin rash, eye pain, sinus congestion and sinus pain, sore throat, dysphagia,  hemoptysis , cough, dyspnea, wheezing, chest pain, palpitations, orthopnea, edema, abdominal pain, nausea, melena, diarrhea, constipation, flank pain, dysuria, hematuria, urinary  Frequency, nocturia, numbness, tingling, seizures,  Focal weakness, Loss of consciousness,  Tremor, insomnia, depression, anxiety, and suicidal ideation.      Objective:  BP (!) 260/114 (BP Location: Left Arm, Patient Position: Sitting, Cuff Size: Normal)   Pulse 89   Temp 98.2 F (36.8 C) (Oral)   Resp 14   Ht 5\' 3"  (1.6 m)   Wt 130 lb 3.2 oz (59.1 kg)   SpO2 98%   BMI 23.06 kg/m   BP Readings from Last 3 Encounters:  04/28/18 (!) 260/114  01/24/18 (!) 222/100  09/22/17 (!) 204/98    Wt Readings from Last 3 Encounters:  04/28/18 130 lb 3.2 oz (59.1 kg)  01/24/18 130 lb 9.6 oz (59.2 kg)  09/22/17 124 lb 12.8 oz (56.6 kg)    General appearance: alert, cooperative and appears stated age Ears: normal TM's and external ear canals both ears Throat: lips, mucosa, and tongue normal; teeth and gums normal Neck: no adenopathy, no carotid bruit, supple, symmetrical, trachea midline and thyroid not enlarged,  symmetric, no tenderness/mass/nodules Back: symmetric, no curvature. ROM normal. No CVA tenderness. Lungs: clear to auscultation bilaterally Heart: regular rate and rhythm, S1, S2 normal, no murmur, click, rub or gallop Abdomen: soft, non-tender; bowel sounds normal; no masses,  no organomegaly Pulses: 2+ and symmetric Skin: Skin color, texture, turgor normal. No rashes or lesions Lymph nodes: Cervical, supraclavicular, and axillary nodes normal.  Lab Results  Component Value Date   HGBA1C 8.2 (H) 04/28/2018   HGBA1C 7.8 (H) 01/24/2018   HGBA1C 14.3 (H) 07/27/2017    Lab Results  Component Value Date   CREATININE 1.08 04/28/2018    CREATININE 1.20 01/24/2018   CREATININE 0.88 09/22/2017    Lab Results  Component Value Date   WBC 5.6 07/28/2017   HGB 12.8 07/28/2017   HCT 38.4 07/28/2017   PLT 151 07/28/2017   GLUCOSE 208 (H) 04/28/2018   CHOL 184 04/28/2018   TRIG 102.0 04/28/2018   HDL 60.50 04/28/2018   LDLDIRECT 145 (H) 07/29/2016   LDLCALC 103 (H) 04/28/2018   ALT 23 04/28/2018   AST 26 04/28/2018   NA 140 04/28/2018   K 4.1 04/28/2018   CL 102 04/28/2018   CREATININE 1.08 04/28/2018   BUN 29 (H) 04/28/2018   CO2 27 04/28/2018   TSH 2.350 01/18/2017   HGBA1C 8.2 (H) 04/28/2018   MICROALBUR 329.2 (H) 01/24/2018    US Carotid Bilateral (at [facility])  Result Date: 07/29/2017 CLINICAL DATA:  CVA. History of hypertension, diabetes and syncopal episode. EXAM: BILATERAL CAROTID DUPLEX ULTRASOUND TECHNIQUE: Pearline Cables scale imaging, color Doppler and duplex ultrasound were performed of bilateral carotid and vertebral arteries in the neck. COMPARISON:  None. FINDINGS: Criteria: Quantification of carotid stenosis is based on velocity parameters that correlate the residual internal carotid diameter with NASCET-based stenosis levels, using the diameter of the distal internal carotid lumen as the denominator for stenosis measurement. The following velocity measurements were obtained: RIGHT ICA:  76/26 cm/sec CCA:  962/95 cm/sec SYSTOLIC ICA/CCA RATIO:  0.7 DIASTOLIC ICA/CCA RATIO:  1.6 ECA:  139 cm/sec LEFT ICA:  113/34 cm/sec CCA:  284/13 cm/sec SYSTOLIC ICA/CCA RATIO:  0.9 DIASTOLIC ICA/CCA RATIO:  1.6 ECA:  189 cm/sec RIGHT CAROTID ARTERY: There is a minimal amount of mixed echogenic plaque within the right carotid bulb (image 15), extending to involve the origin and proximal aspects of the right internal carotid artery (image 22), not resulting in elevated peak systolic velocities within the right internal carotid artery to suggest a hemodynamically significant stenosis. RIGHT VERTEBRAL ARTERY:  Antegrade flow LEFT  CAROTID ARTERY: There is a minimal to moderate amount of atherosclerotic plaque within the left carotid bulb (images 46 and 47), extending to involve the origin and proximal aspects of the left internal carotid artery (image 54), not resulting in elevated peak systolic velocities within the interrogated course of the left internal carotid artery to suggest a hemodynamically significant stenosis. LEFT VERTEBRAL ARTERY:  Antegrade flow IMPRESSION: Minimal to moderate amount of bilateral atherosclerotic plaque, left greater than right, not resulting in a hemodynamically significant stenosis within either internal carotid artery. Electronically Signed   By: Sandi Mariscal M.D.   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  Will  increased  her nph dose in morning to 14 and continue  evening dose of 10 units .  continue metformin and januvia.  Continue lisinopril for proteinuria   Lab Results  Component Value Date   HGBA1C 8.2 (H) 04/28/2018   Lab Results  Component Value Date   MICROALBUR 329.2 (H) 01/24/2018   Lab Results  Component Value Date   CREATININE 1.08 04/28/2018         Relevant Orders   Hemoglobin A1c (Completed)   Comprehensive metabolic panel (Completed)   Lipid panel (Completed)   Hypertension    Uncontrolled , again   Medical noncompliance appears to be contributing based on review of refill history provided by her pharmacy.  Adding carvedilol.       Relevant Medications   carvedilol (COREG) 3.125 MG tablet   amLODipine (NORVASC) 10 MG tablet    A total of 25 minutes of face to face time was spent with patient more than half of which was spent in counselling about the above mentioned conditions  and coordination of care   I have discontinued  Tambra D. Wienke's amLODipine. I am also having her start on carvedilol and amLODipine. Additionally, I am having her maintain her aspirin, cloNIDine, lisinopril-hydrochlorothiazide, sitaGLIPtin, metFORMIN, insulin NPH Human, and atorvastatin.  Meds ordered this encounter  Medications  . carvedilol (COREG) 3.125 MG tablet    Sig: Take 1 tablet (3.125 mg total) by mouth 2 (two) times daily with a meal.    Dispense:  60 tablet    Refill:  11  . amLODipine (NORVASC) 10 MG tablet    Sig: Take 1 tablet (10 mg total) by mouth daily.    Dispense:  90 tablet    Refill:  3    Medications Discontinued During This Encounter  Medication Reason  . amLODipine (NORVASC) 10 MG tablet     Follow-up: Return in about 2 weeks (around 05/12/2018), or RN FOR BP CHECK.   Crecencio Mc, MD

## 2018-04-28 NOTE — Patient Instructions (Signed)
For your blood pressure,  You should be taking:  Amlodipine once tablet daily  clinidine one talbet every 12 hours    Lisinopril hct one tablet daily in am  Carvedilol one tablet every 12 hours (NEW MEDICINE)   RETURN IN 2 WEEKS FOR A BLOOD PRESSURE CHECK

## 2018-04-30 NOTE — Assessment & Plan Note (Signed)
Uncontrolled , again   Medical noncompliance appears to be contributing based on review of refill history provided by her pharmacy.  Adding carvedilol.

## 2018-04-30 NOTE — Assessment & Plan Note (Signed)
Worsening control  Since stoppin gliizide,  Currently taking 10  units NPH in am and 10 units at night.  Will  increased her nph dose in morning to 14 and continue  evening dose of 10 units .  continue metformin and januvia.  Continue lisinopril for proteinuria   Lab Results  Component Value Date   HGBA1C 8.2 (H) 04/28/2018   Lab Results  Component Value Date   MICROALBUR 329.2 (H) 01/24/2018   Lab Results  Component Value Date   CREATININE 1.08 04/28/2018

## 2018-05-01 DIAGNOSIS — R748 Abnormal levels of other serum enzymes: Secondary | ICD-10-CM | POA: Insufficient documentation

## 2018-05-01 NOTE — Assessment & Plan Note (Signed)
Alk phos has been elevated for the last several months,  [place] ordered to to eval GB/liver.

## 2018-05-10 ENCOUNTER — Ambulatory Visit: Payer: Medicare HMO

## 2018-05-19 ENCOUNTER — Ambulatory Visit: Payer: Medicare HMO

## 2018-05-24 ENCOUNTER — Ambulatory Visit: Payer: Medicare HMO

## 2018-08-04 ENCOUNTER — Ambulatory Visit (INDEPENDENT_AMBULATORY_CARE_PROVIDER_SITE_OTHER): Payer: Medicare HMO

## 2018-08-04 ENCOUNTER — Ambulatory Visit (INDEPENDENT_AMBULATORY_CARE_PROVIDER_SITE_OTHER): Payer: Medicare HMO | Admitting: Internal Medicine

## 2018-08-04 ENCOUNTER — Encounter: Payer: Self-pay | Admitting: Internal Medicine

## 2018-08-04 VITALS — BP 194/70 | HR 75 | Temp 98.5°F | Resp 15 | Ht 63.75 in | Wt 138.4 lb

## 2018-08-04 DIAGNOSIS — E1165 Type 2 diabetes mellitus with hyperglycemia: Secondary | ICD-10-CM | POA: Diagnosis not present

## 2018-08-04 DIAGNOSIS — Z0000 Encounter for general adult medical examination without abnormal findings: Secondary | ICD-10-CM

## 2018-08-04 DIAGNOSIS — N182 Chronic kidney disease, stage 2 (mild): Secondary | ICD-10-CM

## 2018-08-04 DIAGNOSIS — Z23 Encounter for immunization: Secondary | ICD-10-CM

## 2018-08-04 DIAGNOSIS — IMO0002 Reserved for concepts with insufficient information to code with codable children: Secondary | ICD-10-CM

## 2018-08-04 DIAGNOSIS — I10 Essential (primary) hypertension: Secondary | ICD-10-CM | POA: Diagnosis not present

## 2018-08-04 DIAGNOSIS — E2839 Other primary ovarian failure: Secondary | ICD-10-CM | POA: Diagnosis not present

## 2018-08-04 DIAGNOSIS — E1122 Type 2 diabetes mellitus with diabetic chronic kidney disease: Secondary | ICD-10-CM | POA: Diagnosis not present

## 2018-08-04 LAB — POCT GLYCOSYLATED HEMOGLOBIN (HGB A1C): HEMOGLOBIN A1C: 8.4 % — AB (ref 4.0–5.6)

## 2018-08-04 LAB — COMPREHENSIVE METABOLIC PANEL
ALBUMIN: 4 g/dL (ref 3.5–5.2)
ALT: 17 U/L (ref 0–35)
AST: 19 U/L (ref 0–37)
Alkaline Phosphatase: 112 U/L (ref 39–117)
BUN: 31 mg/dL — AB (ref 6–23)
CALCIUM: 8.9 mg/dL (ref 8.4–10.5)
CHLORIDE: 104 meq/L (ref 96–112)
CO2: 25 meq/L (ref 19–32)
CREATININE: 1.22 mg/dL — AB (ref 0.40–1.20)
GFR: 56.19 mL/min — ABNORMAL LOW (ref >60.00)
Glucose, Bld: 172 mg/dL — ABNORMAL HIGH (ref 70–99)
Potassium: 4.1 mEq/L (ref 3.5–5.1)
SODIUM: 138 meq/L (ref 135–145)
Total Bilirubin: 0.5 mg/dL (ref 0.2–1.2)
Total Protein: 7.3 g/dL (ref 6.0–8.3)

## 2018-08-04 LAB — LIPID PANEL
CHOLESTEROL: 199 mg/dL (ref 0–200)
HDL: 59.4 mg/dL (ref >39.00)
LDL CALC: 123 mg/dL — AB (ref 0–99)
NonHDL: 139.87
TRIGLYCERIDES: 84 mg/dL (ref 0.0–149.0)
Total CHOL/HDL Ratio: 3
VLDL: 16.8 mg/dL (ref 0.0–40.0)

## 2018-08-04 MED ORDER — CLONIDINE HCL 0.2 MG PO TABS: 0.2000 mg | ORAL_TABLET | Freq: Two times a day (BID) | ORAL | 5 refills | Status: DC

## 2018-08-04 MED ORDER — INSULIN NPH (HUMAN) (ISOPHANE) 100 UNIT/ML ~~LOC~~ SUSP: SUBCUTANEOUS | 4 refills | Status: AC

## 2018-08-04 MED ORDER — CARVEDILOL 6.25 MG PO TABS: 6.2500 mg | ORAL_TABLET | Freq: Two times a day (BID) | ORAL | 2 refills | Status: DC

## 2018-08-04 MED ORDER — SITAGLIPTIN PHOSPHATE 100 MG PO TABS
100.0000 mg | ORAL_TABLET | Freq: Every day | ORAL | 5 refills | Status: DC
Start: 2018-08-04 — End: 2018-12-12

## 2018-08-04 NOTE — Progress Notes (Addendum)
Subjective:   Julie Dominguez is a 67 y.o. female who presents for Medicare Annual (Subsequent) preventive examination.  Review of Systems:  No ROS.  Medicare Wellness Visit. Additional risk factors are reflected in the social history.  Cardiac Risk Factors include: advanced age (>93men, >83 women);hypertension;diabetes mellitus     Objective:     Vitals: BP (!) 194/70 (BP Location: Left Arm, Patient Position: Sitting, Cuff Size: Normal)   Pulse 75   Temp 98.5 F (36.9 C) (Oral)   Resp 15   Ht 5' 3.75" (1.619 m)   Wt 138 lb 6.4 oz (62.8 kg)   SpO2 98%   BMI 23.94 kg/m   Body mass index is 23.94 kg/m.  Advanced Directives 08/04/2018 07/27/2017 07/27/2017 07/29/2016  Does Patient Have a Medical Advance Directive? No No No No  Would patient like information on creating a medical advance directive? Yes (MAU/Ambulatory/Procedural Areas - Information given) No - Patient declined No - Patient declined Yes - Scientist, clinical (histocompatibility and immunogenetics) given    Tobacco Social History   Tobacco Use  Smoking Status Never Smoker  Smokeless Tobacco Never Used     Counseling given: Not Answered   Clinical Intake:  Pre-visit preparation completed: Yes  Pain : No/denies pain     Nutritional Status: BMI of 19-24  Normal Diabetes: Yes(Followed by pcp)  How often do you need to have someone help you when you read instructions, pamphlets, or other written materials from your doctor or pharmacy?: 1 - Never  Interpreter Needed?: No     Past Medical History:  Diagnosis Date  . Diabetes mellitus 2007  . Diastolic dysfunction    a. 07/28/2017 Echo: EF 60-65%, no rwma, Gr1 DD, nl LA size, no cardiac source of emboli.  Marland Kitchen Hyperlipidemia   . Hypertension    a. Dx 4081; b. 12/2016 Neg pheo w/u. Nl Aldo/PRA;  c. 06/2017 HTN urgency w/ dizziness and acute R occipital and bilat cerebellar stroke.  . Stroke [facility])    a. 07/27/2017 MRI Brain: acute on subacute R occipital lobe and bilat cerbellar infarcts.  Old, small L PCA (left occipital) and old, small L ACA (L frontoparietal lobes) territory infarcts. Mild chronic small vessel isch dzs; b. 07/29/2017 Carotid U/S: minimal to mod L>R bilat ICA stenosis. Not hemodynamically significant.   Past Surgical History:  Procedure Laterality Date  . EYE SURGERY Bilateral August 2013, Oct 2013   catraract with lens implant dingledein    Family History  Problem Relation Age of Onset  . Stroke Mother   . Hypertension Mother   . Alzheimer's disease Father   . Cancer - Colon Maternal Aunt   . Stroke Brother   . Breast cancer Neg Hx    Social History   Socioeconomic History  . Marital status: Single    Spouse name: Not on file  . Number of children: Not on file  . Years of education: Not on file  . Highest education level: Not on file  Occupational History  . Not on file  Social Needs  . Financial resource strain: Not hard at all  . Food insecurity:    Worry: Never true    Inability: Never true  . Transportation needs:    Medical: Not on file    Non-medical: Not on file  Tobacco Use  . Smoking status: Never Smoker  . Smokeless tobacco: Never Used  Substance and Sexual Activity  . Alcohol use: No  . Drug use: No  . Sexual activity: Not Currently  Lifestyle  . Physical activity:    Days per week: 0 days    Minutes per session: Not on file  . Stress: Not on file  Relationships  . Social connections:    Talks on phone: Not on file    Gets together: Not on file    Attends religious service: Not on file    Active member of club or organization: Not on file    Attends meetings of clubs or organizations: Not on file    Relationship status: Not on file  Other Topics Concern  . Not on file  Social History Narrative  . Not on file    Outpatient Encounter Medications as of 08/04/2018  Medication Sig  . amLODipine (NORVASC) 10 MG tablet Take 1 tablet (10 mg total) by mouth daily.  Marland Kitchen aspirin 81 MG tablet Take 1 tablet (81 mg total) by  mouth daily.  Marland Kitchen atorvastatin (LIPITOR) 40 MG tablet Take 1 tablet (40 mg total) by mouth daily at 6 PM.  . lisinopril-hydrochlorothiazide (PRINZIDE,ZESTORETIC) 20-25 MG tablet Take 1 tablet by mouth daily.  . metFORMIN (GLUCOPHAGE) 850 MG tablet TAKE 1 TABLET BY MOUTH TWICE A DAY WITH MEAL  . [DISCONTINUED] carvedilol (COREG) 3.125 MG tablet Take 1 tablet (3.125 mg total) by mouth 2 (two) times daily with a meal.  . [DISCONTINUED] cloNIDine (CATAPRES) 0.2 MG tablet Take 1 tablet (0.2 mg total) by mouth 2 (two) times daily.  . [DISCONTINUED] insulin NPH Human (HUMULIN N,NOVOLIN N) 100 UNIT/ML injection 14 UNITS IN THE AM.  8 UNITS IN THE PM  . [DISCONTINUED] sitaGLIPtin (JANUVIA) 50 MG tablet Take 1 tablet (50 mg total) by mouth daily.   No facility-administered encounter medications on file as of 08/04/2018.     Activities of Daily Living In your present state of health, do you have any difficulty performing the following activities: 08/04/2018  Hearing? N  Vision? N  Difficulty concentrating or making decisions? N  Walking or climbing stairs? N  Dressing or bathing? N  Doing errands, shopping? N  Preparing Food and eating ? N  Using the Toilet? N  In the past six months, have you accidently leaked urine? N  Do you have problems with loss of bowel control? N  Managing your Medications? N  Managing your Finances? N  Housekeeping or managing your Housekeeping? N  Some recent data might be hidden    Patient Care Team: Crecencio Mc, MD as PCP - General (Internal Medicine) Christene Lye, MD (General Surgery) Crecencio Mc, MD (Internal Medicine)    Assessment:   This is a routine wellness examination for Julie Dominguez.  The goal of the wellness visit is to assist the patient how to close the gaps in care and create a preventative care plan for the patient.   The roster of all physicians providing medical care to patient is listed in the Snapshot section of the  chart.  Osteoporosis risk reviewed.  Bone density ordered; follow as directed. Educational material provided.   Safety issues reviewed; Lives alone. Smoke and carbon monoxide detectors in the home. No firearms in the home. Wears seatbelts when driving or riding with others. No violence in the home.  They do not have excessive sun exposure.  Discussed the need for sun protection: hats, long sleeves and the use of sunscreen if there is significant sun exposure.  Patient is alert, normal appearance, oriented to person/place/and time.  Correctly identified the president of the [place] and recalls of 2/3 words.  Performs simple calculations and can read correct time from watch face.  Displays appropriate judgement.  No new identified risk were noted.  No failures at ADL's or IADL's.    BMI- discussed the importance of a healthy diet, water intake and the benefits of aerobic exercise. Educational material provided.   24 hour diet recall: Regular diet  Sleep patterns- Sleeps ok.   Influenza and TDAP discussed.   Exercise Activities and Dietary recommendations Current Exercise Habits: Home exercise routine, Type of exercise: walking, Time (Minutes): 20, Frequency (Times/Week): 2, Weekly Exercise (Minutes/Week): 40, Intensity: Mild  Goals    . Low carb diet     Monitor foods high in sugar Eat vegetables and lean proteins       Fall Risk Fall Risk  08/04/2018 09/22/2017 07/29/2016 11/29/2015  Falls in the past year? No No No No    Depression Screen PHQ 2/9 Scores 08/04/2018 09/22/2017 07/29/2016 11/29/2015  PHQ - 2 Score 0 0 0 0  PHQ- 9 Score - 0 - -     Cognitive Function MMSE - Mini Mental State Exam 07/29/2016  Orientation to time 5  Orientation to Place 5  Registration 3  Attention/ Calculation 5  Recall 3  Language- name 2 objects 2  Language- repeat 1  Language- follow 3 step command 3  Language- read & follow direction 1  Write a sentence 1  Copy design 1  Total score  30     6CIT Screen 08/04/2018  What Year? 0 points  What month? 0 points  What time? 0 points  Count back from 20 0 points  Months in reverse 0 points  Repeat phrase 0 points  Total Score 0    Immunization History  Administered Date(s) Administered  . Influenza, High Dose Seasonal PF 07/29/2016, 09/22/2017, 08/04/2018  . Influenza,inj,Quad PF,6+ Mos 08/29/2014, 07/25/2015  . Pneumococcal Conjugate-13 01/31/2014  . Pneumococcal Polysaccharide-23 02/01/2008, 08/30/2015   Screening Tests Health Maintenance  Topic Date Due  . TETANUS/TDAP  06/28/1968  . DEXA SCAN  06/28/2014  . OPHTHALMOLOGY EXAM  06/10/2015  . HEMOGLOBIN A1C  02/02/2019  . FOOT EXAM  04/29/2019  . MAMMOGRAM  07/20/2019  . COLONOSCOPY  04/18/2024  . INFLUENZA VACCINE  Completed  . Hepatitis C Screening  Completed  . PNA vac Low Risk Adult  Completed      Plan:   End of life planning; Advance aging; Advanced directives discussed. Copy of current HCPOA/Living Will requested upon completion.   I have personally reviewed and noted the following in the patient's chart:   . Medical and social history . Use of alcohol, tobacco or illicit drugs  . Current medications and supplements . Functional ability and status . Nutritional status . Physical activity . Advanced directives . List of other physicians . Hospitalizations, surgeries, and ER visits in previous 12 months . Vitals . Screenings to include cognitive, depression, and falls . Referrals and appointments  In addition, I have reviewed and discussed with patient certain preventive protocols, quality metrics, and best practice recommendations. A written personalized care plan for preventive services as well as general preventive health recommendations were provided to patient.     OBrien-Blaney, Yvett Rossel L, LPN  02/02/6293    I have reviewed the above information and agree with above.   Deborra Medina, MD

## 2018-08-04 NOTE — Progress Notes (Signed)
Subjective:  Patient ID: Julie Dominguez, female    DOB: [birth year]  Age: 67 y.o. 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 Diagnoses of Need for influenza vaccination and Essential hypertension were also pertinent to this visit.  HPI [name] presents for 3 month follow up on diabetes and hypertension   Patient did not bring her blood sugar readings with her today but report checking them regularly. .  Patient is following a low glycemic index diet about 50% of the time and not  taking all prescribed medications regularly for unclear reasons   Fasting sugars have been under less than 100 most of the time and post prandial lunch have been under 180 except on rare occasions.  Between and 4 and 6 pm her   evening blood sugars  Have dropped to 80 or 90 before she eats dinner.  Patient is not exercising  Or trying to lose weight .  Patient has not had an eye exam since 2015 because she owes the eye doctor money , and she  checks feet regularly for signs of infection.  Patient does not walk barefoot outside,  And denies any numbness tingling or burning in feet. Patient is up to date on all recommended vaccinations  Lab Results  Component Value Date   HGBA1C 8.4 (A) 08/04/2018   Hasn't been taking glipizide until last week . Says she had a low BS this morning at 67 fasting . Usually 80 or 90 , (just restarted the glipizide)   Was told that the insulin was $200/month at [facility],  And $149 at [facility].  And only  $24/month at [facility]   2) Hypertension:  She reports adherence to medication regimen but notes that Clonidine makes her feel weak . Her home readings have been around 193 systolic.     Outpatient Medications Prior to Visit  Medication Sig Dispense Refill  . amLODipine (NORVASC) 10 MG tablet Take 1 tablet (10 mg total) by mouth daily. 90 tablet 3  . aspirin 81 MG tablet Take 1 tablet (81 mg  total) by mouth daily. 30 tablet 1  . atorvastatin (LIPITOR) 40 MG tablet Take 1 tablet (40 mg total) by mouth daily at 6 PM. 30 tablet 5  . glipiZIDE (GLUCOTROL) 5 MG tablet TAKE 1 TABLET BY MOUTH IN THE MORNING AND 2 IN THE EVENING BEFORE MEALS  0  . lisinopril-hydrochlorothiazide (PRINZIDE,ZESTORETIC) 20-25 MG tablet Take 1 tablet by mouth daily. 90 tablet 3  . metFORMIN (GLUCOPHAGE) 850 MG tablet TAKE 1 TABLET BY MOUTH TWICE A DAY WITH MEAL 180 tablet 3  . carvedilol (COREG) 3.125 MG tablet Take 1 tablet (3.125 mg total) by mouth 2 (two) times daily with a meal. 60 tablet 11  . cloNIDine (CATAPRES) 0.2 MG tablet Take 1 tablet (0.2 mg total) by mouth 2 (two) times daily. 60 tablet 5  . insulin NPH Human (HUMULIN N,NOVOLIN N) 100 UNIT/ML injection 14 UNITS IN THE AM.  8 UNITS IN THE PM 10 mL 4  . sitaGLIPtin (JANUVIA) 50 MG tablet Take 1 tablet (50 mg total) by mouth daily. 90 tablet 2   No facility-administered medications prior to visit.     Review of Systems;  Patient denies headache, fevers, malaise, unintentional weight loss, skin rash, eye pain, sinus congestion and sinus pain, sore throat, dysphagia,  hemoptysis , cough, dyspnea, wheezing, chest pain, palpitations, orthopnea, edema, abdominal pain, nausea, melena, diarrhea,  constipation, flank pain, dysuria, hematuria, urinary  Frequency, nocturia, numbness, tingling, seizures,  Focal weakness, Loss of consciousness,  Tremor, insomnia, depression, anxiety, and suicidal ideation.      Objective:  BP (!) 194/70 (BP Location: Left Arm, Patient Position: Sitting, Cuff Size: Normal)   Pulse 75   Temp 98.5 F (36.9 C) (Oral)   Resp 15   Ht 5' 3.75" (1.619 m)   Wt 138 lb 6.4 oz (62.8 kg)   SpO2 98%   BMI 23.94 kg/m   BP Readings from Last 3 Encounters:  08/04/18 (!) 194/70  08/04/18 (!) 194/70  04/28/18 (!) 260/114    Wt Readings from Last 3 Encounters:  08/04/18 138 lb 6.4 oz (62.8 kg)  08/04/18 138 lb 6.4 oz (62.8 kg)    04/28/18 130 lb 3.2 oz (59.1 kg)    General appearance: alert, cooperative and appears stated age Ears: normal TM's and external ear canals both ears Throat: lips, mucosa, and tongue normal; teeth and gums normal Neck: no adenopathy, no carotid bruit, supple, symmetrical, trachea midline and thyroid not enlarged, symmetric, no tenderness/mass/nodules Back: symmetric, no curvature. ROM normal. No CVA tenderness. Lungs: clear to auscultation bilaterally Heart: regular rate and rhythm, S1, S2 normal, no murmur, click, rub or gallop Abdomen: soft, non-tender; bowel sounds normal; no masses,  no organomegaly Pulses: 2+ and symmetric Skin: Skin color, texture, turgor normal. No rashes or lesions Lymph nodes: Cervical, supraclavicular, and axillary nodes normal.  Lab Results  Component Value Date   HGBA1C 8.4 (A) 08/04/2018   HGBA1C 8.2 (H) 04/28/2018   HGBA1C 7.8 (H) 01/24/2018    Lab Results  Component Value Date   CREATININE 1.22 (H) 08/04/2018   CREATININE 1.08 04/28/2018   CREATININE 1.20 01/24/2018    Lab Results  Component Value Date   WBC 5.6 07/28/2017   HGB 12.8 07/28/2017   HCT 38.4 07/28/2017   PLT 151 07/28/2017   GLUCOSE 172 (H) 08/04/2018   CHOL 199 08/04/2018   TRIG 84.0 08/04/2018   HDL 59.40 08/04/2018   LDLDIRECT 145 (H) 07/29/2016   LDLCALC 123 (H) 08/04/2018   ALT 17 08/04/2018   AST 19 08/04/2018   NA 138 08/04/2018   K 4.1 08/04/2018   CL 104 08/04/2018   CREATININE 1.22 (H) 08/04/2018   BUN 31 (H) 08/04/2018   CO2 25 08/04/2018   TSH 2.350 01/18/2017   HGBA1C 8.4 (A) 08/04/2018   MICROALBUR 329.2 (H) 01/24/2018    US Carotid Bilateral (at [facility] Only)  Result Date: 07/29/2017 CLINICAL DATA:  CVA. History of hypertension, diabetes and syncopal episode. EXAM: BILATERAL CAROTID DUPLEX ULTRASOUND TECHNIQUE: Pearline Cables scale imaging, color Doppler and duplex ultrasound were performed of bilateral carotid and vertebral arteries in the neck.  COMPARISON:  None. FINDINGS: Criteria: Quantification of carotid stenosis is based on velocity parameters that correlate the residual internal carotid diameter with NASCET-based stenosis levels, using the diameter of the distal internal carotid lumen as the denominator for stenosis measurement. The following velocity measurements were obtained: RIGHT ICA:  76/26 cm/sec CCA:  469/62 cm/sec SYSTOLIC ICA/CCA RATIO:  0.7 DIASTOLIC ICA/CCA RATIO:  1.6 ECA:  139 cm/sec LEFT ICA:  113/34 cm/sec CCA:  952/84 cm/sec SYSTOLIC ICA/CCA RATIO:  0.9 DIASTOLIC ICA/CCA RATIO:  1.6 ECA:  189 cm/sec RIGHT CAROTID ARTERY: There is a minimal amount of mixed echogenic plaque within the right carotid bulb (image 15), extending to involve the origin and proximal aspects of the right internal carotid artery (image 22),  not resulting in elevated peak systolic velocities within the right internal carotid artery to suggest a hemodynamically significant stenosis. RIGHT VERTEBRAL ARTERY:  Antegrade flow LEFT CAROTID ARTERY: There is a minimal to moderate amount of atherosclerotic plaque within the left carotid bulb (images 46 and 47), extending to involve the origin and proximal aspects of the left internal carotid artery (image 54), not resulting in elevated peak systolic velocities within the interrogated course of the left internal carotid artery to suggest a hemodynamically significant stenosis. LEFT VERTEBRAL ARTERY:  Antegrade flow IMPRESSION: Minimal to moderate amount of bilateral atherosclerotic plaque, left greater than right, not resulting in a hemodynamically significant stenosis within either internal carotid artery. Electronically Signed   By: Sandi Mariscal M.D.   On: 07/29/2017 10:43    Assessment & Plan:   Problem List Items Addressed This Visit    Hypertension    She is  not tolerating clonidine due to dizziness.  Will stop clonidine and increase carvedilol  Dose  To 6.25 mg bid.  Return in one week for bp check AND  advised to bring home monitor.       Relevant Medications   cloNIDine (CATAPRES) 0.2 MG tablet   carvedilol (COREG) 6.25 MG tablet   Uncontrolled diabetes mellitus with stage 2 chronic kidney disease ([place]) - Primary    Patient is unable to maintain adherence to medication regimen on a consistent basis despite my attempts to choose the generic medications and at times paying for the medications to ensure access,  Which I am no longer willing to do.  She has been having recurrent hypoglycemia after recently resumed taking glipizide after inadvertently stopping it .  Will reduce insuling doses by 5 untis, each,  Continue glipizide  And increase januvia to 1000 mg daily  referring to clinical pharmacist   Lab Results  Component Value Date   HGBA1C 8.4 (A) 08/04/2018         Relevant Medications   glipiZIDE (GLUCOTROL) 5 MG tablet   sitaGLIPtin (JANUVIA) 100 MG tablet   insulin NPH Human (HUMULIN N,NOVOLIN N) 100 UNIT/ML injection   Other Relevant Orders   POCT HgB A1C (Completed)   Amb Referral to Clinical Pharmacist   Comprehensive metabolic panel (Completed)   Lipid panel (Completed)    Other Visit Diagnoses    Need for influenza vaccination       Relevant Orders   Flu vaccine HIGH DOSE PF (Fluzone High dose) (Completed)     A total of 25 minutes of face to face time was spent with patient more than half of which was spent in counselling about the above mentioned conditions  and coordination of care    I have changed Salah D. Sassi's carvedilol, sitaGLIPtin, and insulin NPH Human. I am also having her maintain her aspirin, lisinopril-hydrochlorothiazide, metFORMIN, atorvastatin, amLODipine, glipiZIDE, and cloNIDine.  Meds ordered this encounter  Medications  . cloNIDine (CATAPRES) 0.2 MG tablet    Sig: Take 1 tablet (0.2 mg total) by mouth 2 (two) times daily.    Dispense:  60 tablet    Refill:  5  . carvedilol (COREG) 6.25 MG tablet    Sig: Take 1 tablet (6.25 mg total) by  mouth 2 (two) times daily with a meal.    Dispense:  60 tablet    Refill:  2    NOTE DOSE INCREASE.  KEEP ON FILE FOR REFILL IN ABOUT 2 WEEKS.  . sitaGLIPtin (JANUVIA) 100 MG tablet    Sig: Take 1  tablet (100 mg total) by mouth daily.    Dispense:  30 tablet    Refill:  5  . insulin NPH Human (HUMULIN N,NOVOLIN N) 100 UNIT/ML injection    Sig: 12  UNITS IN THE AM.  5 UNITS IN THE PM    Dispense:  10 mL    Refill:  4    Medications Discontinued During This Encounter  Medication Reason  . cloNIDine (CATAPRES) 0.2 MG tablet   . carvedilol (COREG) 3.125 MG tablet   . sitaGLIPtin (JANUVIA) 50 MG tablet   . insulin NPH Human (HUMULIN N,NOVOLIN N) 100 UNIT/ML injection     Follow-up: Return in about 3 months (around 11/03/2018) for follow up diabetes.   Crecencio Mc, MD

## 2018-08-04 NOTE — Patient Instructions (Addendum)
  Julie Dominguez , Thank you for taking time to come for your Medicare Wellness Visit. I appreciate your ongoing commitment to your health goals. Please review the following plan we discussed and let me know if I can assist you in the future.   Follow up as needed.    Bring a copy of your [place] and/or Living Will to be scanned into chart upon completion.  Have a great day!  These are the goals we discussed: Goals    . Low carb diet     Monitor foods high in sugar Eat vegetables and lean proteins       This is a list of the screening recommended for you and due dates:  Health Maintenance  Topic Date Due  . Tetanus Vaccine  06/28/1968  . DEXA scan (bone density measurement)  06/28/2014  . Eye exam for diabetics  06/10/2015  . Hemoglobin A1C  02/02/2019  . Complete foot exam   04/29/2019  . Mammogram  07/20/2019  . Colon Cancer Screening  04/18/2024  . Flu Shot  Completed  .  Hepatitis C: One time screening is recommended by Center for Disease Control  (CDC) for  adults born from [birth year] through 1967.   Completed  . Pneumonia vaccines  Completed

## 2018-08-04 NOTE — Patient Instructions (Addendum)
For your diabetes :   reduce your dose of insulin to 5 units at night   Reduce the morning dose of insulin to 12 units.   Continue glipizide one tablet in the morning and 2  In the evening before your meal  continue metformin.    I have increased  the Januvia to  (100 mg  daily ) and sent the new rx to your pharmacy.  You can take 2 of the 50 mg tablets daily until you run out     Your blood pressure needs to be < 140/80  Increase the carvedilol to 6.25 mg twice daily and stop the clonidine   Return for BP check in 1 week BRING YOUR HOME MONITOR   I am making you an appointment with our clinical  pharmacist in a few weeks to help monitor your diabets  This is FREE OF CHARGE.  BRING YOUR BLOOD SUGAR LOGS WITH YOU

## 2018-08-07 NOTE — Assessment & Plan Note (Addendum)
She is  not tolerating clonidine due to dizziness.  Will stop clonidine and increase carvedilol  Dose  To 6.25 mg bid.  Return in one week for bp check AND advised to bring home monitor.

## 2018-08-07 NOTE — Assessment & Plan Note (Signed)
Patient is unable to maintain adherence to medication regimen on a consistent basis despite my attempts to choose the generic medications and at times paying for the medications to ensure access,  Which I am no longer willing to do.  She has been having recurrent hypoglycemia after recently resumed taking glipizide after inadvertently stopping it .  Will reduce insuling doses by 5 untis, each,  Continue glipizide  And increase januvia to 1000 mg daily  referring to clinical pharmacist   Lab Results  Component Value Date   HGBA1C 8.4 (A) 08/04/2018

## 2018-08-18 ENCOUNTER — Ambulatory Visit: Payer: Medicare HMO

## 2018-08-18 IMAGING — US US CAROTID DUPLEX BILAT
1 series · 13 of 24 positions shown · non-contrast
Comparison: None.

CLINICAL DATA: CVA. History of hypertension, diabetes and syncopal
episode.

EXAM:
BILATERAL CAROTID DUPLEX ULTRASOUND
TECHNIQUE: Gray scale imaging, color Doppler and duplex ultrasound were
performed of bilateral carotid and vertebral arteries in the neck.

[Series 1: us carotid duplex bilat · 13 of 65 slices shown]
[im 1/65]
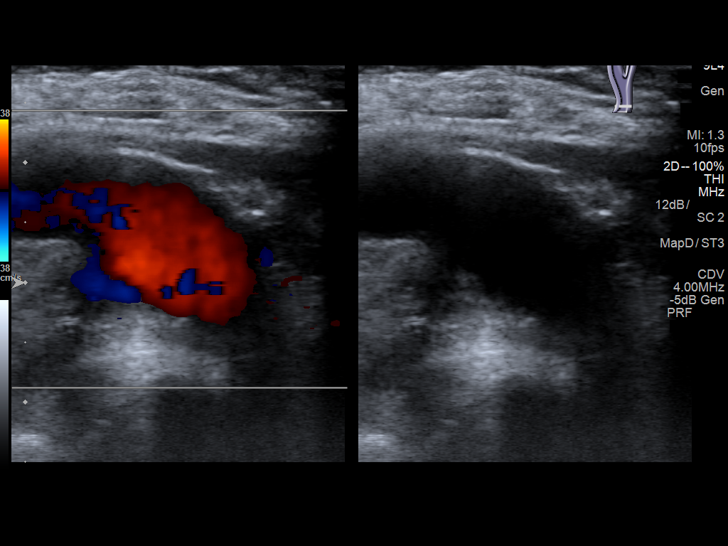
[im 6/65]
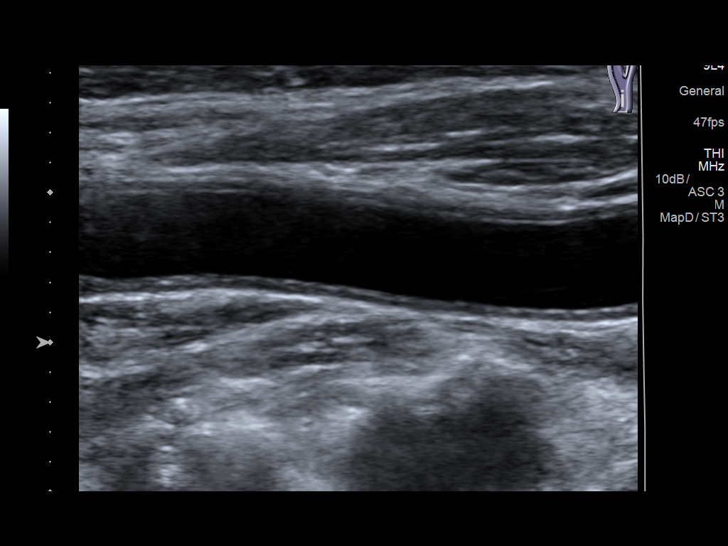
[im 12/65]
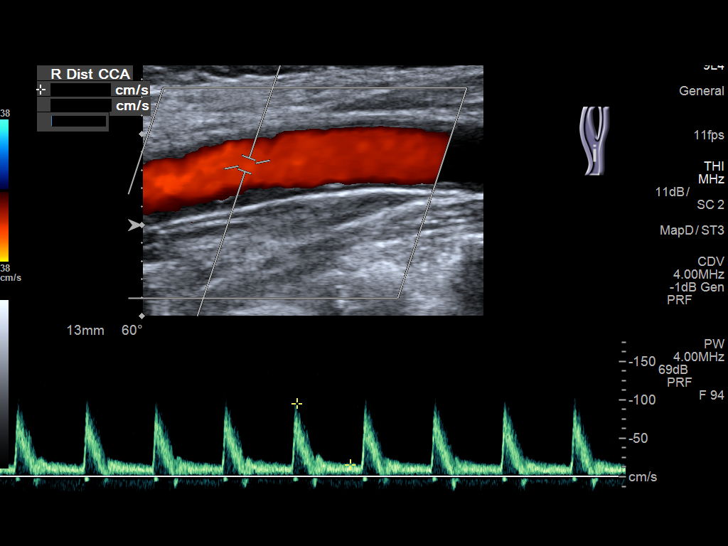
[im 17/65]
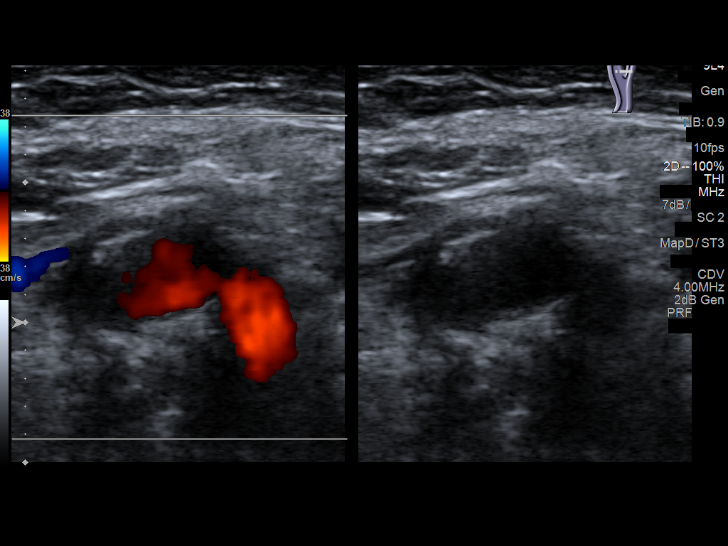
[im 23/65]
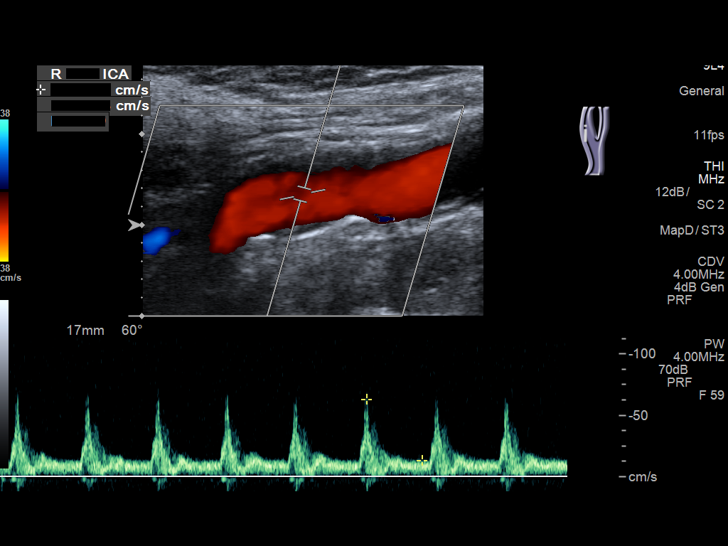
[im 28/65]
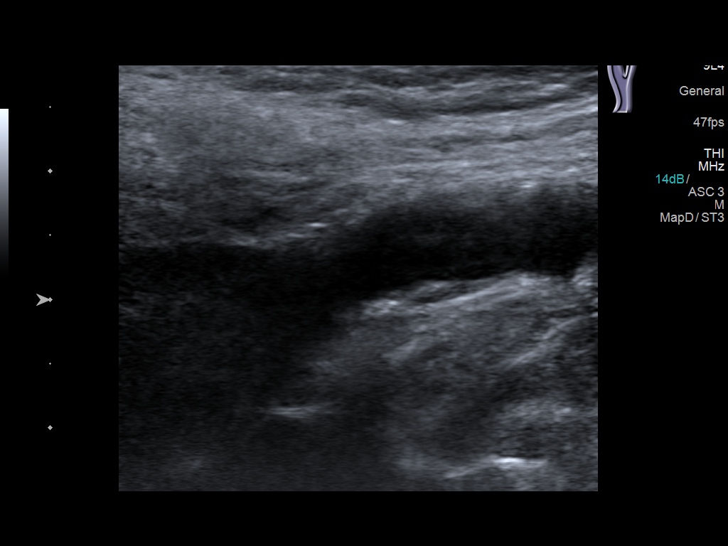
[im 34/65]
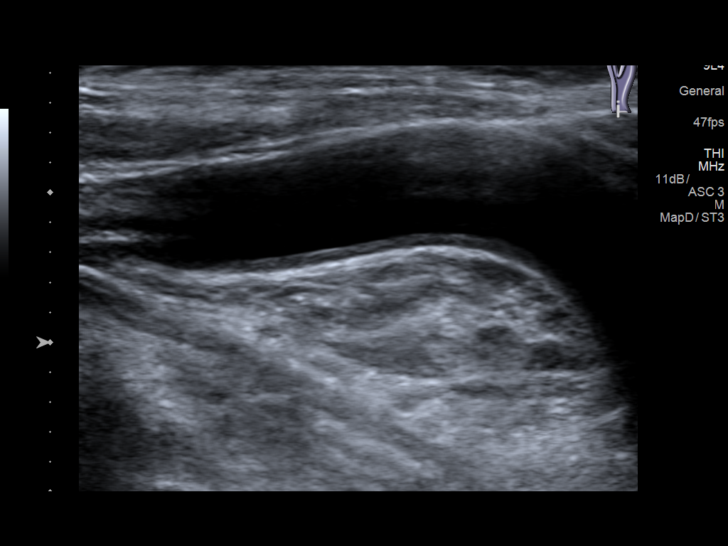
[im 37/65]
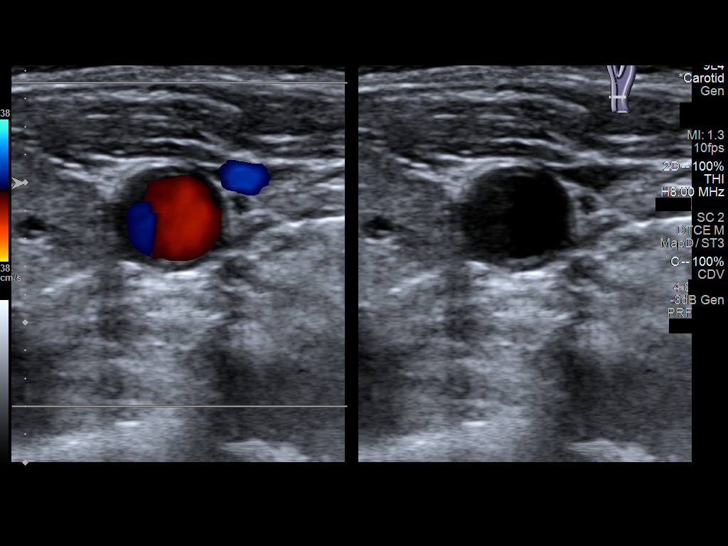
[im 42/65]
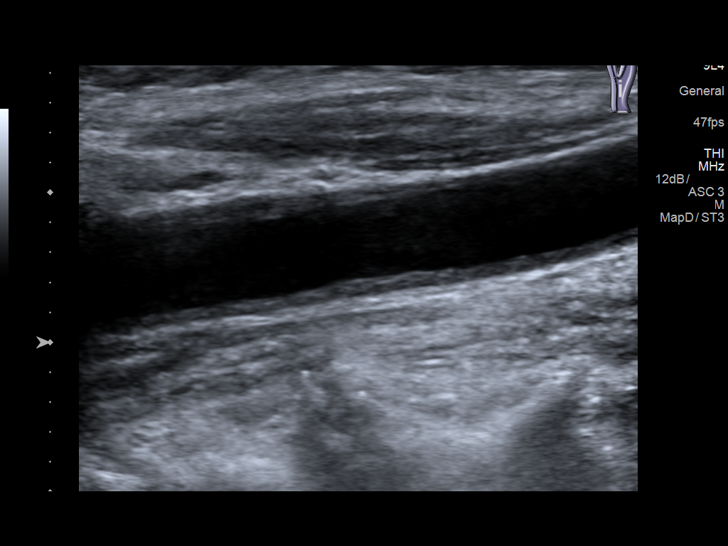
[im 48/65]
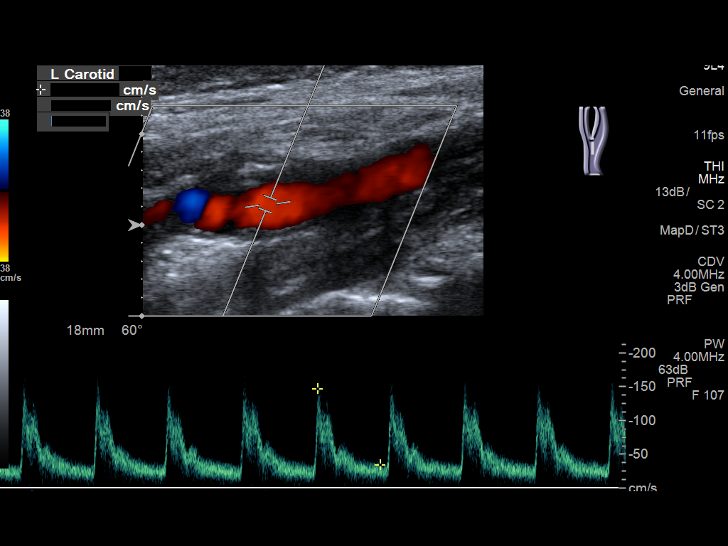
[im 53/65]
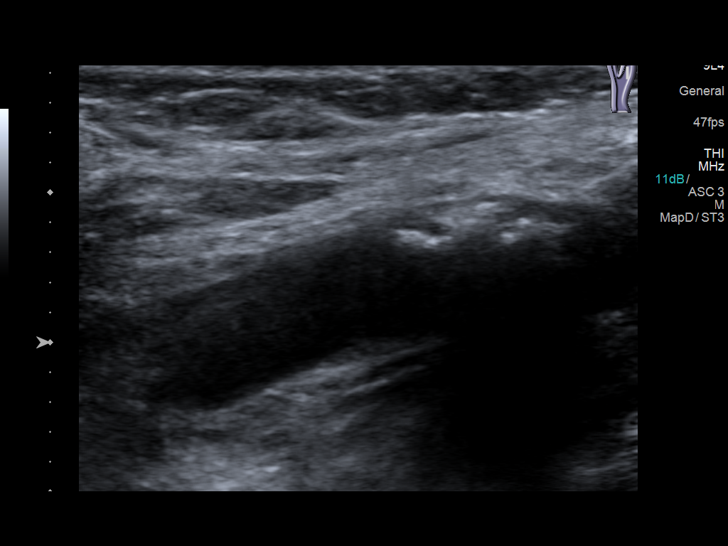
[im 59/65]
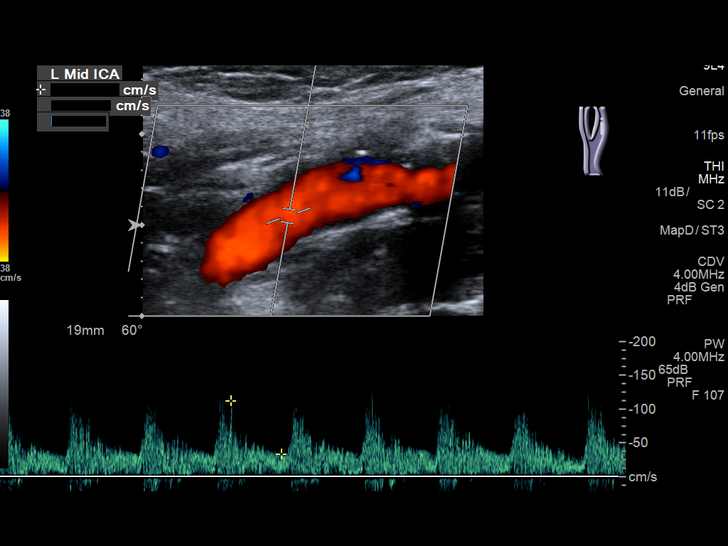
[im 65/65]
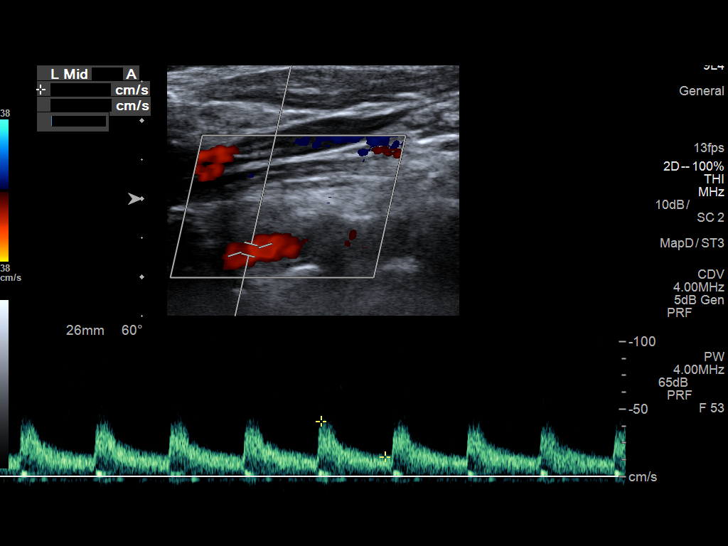

[13 of 24 positions shown; findings below may reference images not displayed]

FINDINGS: Criteria: Quantification of carotid stenosis is based on velocity
parameters that correlate the residual internal carotid diameter
with NASCET-based stenosis levels, using the diameter of the distal
internal carotid lumen as the denominator for stenosis measurement.

The following velocity measurements were obtained:

RIGHT

ICA:  76/26 cm/sec

CCA:  103/17 cm/sec

SYSTOLIC ICA/CCA RATIO:

DIASTOLIC ICA/CCA RATIO:

ECA:  139 cm/sec

LEFT

ICA:  113/34 cm/sec

CCA:  123/21 cm/sec

SYSTOLIC ICA/CCA RATIO:

DIASTOLIC ICA/CCA RATIO:

ECA:  189 cm/sec

RIGHT CAROTID ARTERY: There is a minimal amount of mixed echogenic
plaque within the right carotid bulb (image 15), extending to
involve the origin and proximal aspects of the right internal
carotid artery (image 22), not resulting in elevated peak systolic
velocities within the right internal carotid artery to suggest a
hemodynamically significant stenosis.

RIGHT VERTEBRAL ARTERY:  Antegrade flow

LEFT CAROTID ARTERY: There is a minimal to moderate amount of
atherosclerotic plaque within the left carotid bulb (images 46 and
47), extending to involve the origin and proximal aspects of the
left internal carotid artery (image 54), not resulting in elevated
peak systolic velocities within the interrogated course of the left
internal carotid artery to suggest a hemodynamically significant
stenosis.

LEFT VERTEBRAL ARTERY:  Antegrade flow
IMPRESSION: Minimal to moderate amount of bilateral atherosclerotic plaque, left
greater than right, not resulting in a hemodynamically significant
stenosis within either internal carotid artery.

## 2018-08-22 ENCOUNTER — Ambulatory Visit: Payer: Medicare HMO | Admitting: Pharmacist

## 2018-09-26 ENCOUNTER — Ambulatory Visit: Payer: Medicare HMO | Admitting: Pharmacist

## 2018-10-03 ENCOUNTER — Other Ambulatory Visit: Payer: Self-pay | Admitting: Internal Medicine

## 2018-11-03 ENCOUNTER — Ambulatory Visit: Payer: Medicare HMO | Admitting: Internal Medicine

## 2018-11-28 ENCOUNTER — Ambulatory Visit: Payer: Medicare HMO | Admitting: Pharmacist

## 2018-12-12 ENCOUNTER — Ambulatory Visit (INDEPENDENT_AMBULATORY_CARE_PROVIDER_SITE_OTHER): Payer: Medicare HMO | Admitting: Internal Medicine

## 2018-12-12 ENCOUNTER — Encounter: Payer: Self-pay | Admitting: Internal Medicine

## 2018-12-12 VITALS — BP 218/92 | HR 71 | Temp 98.0°F | Resp 15 | Ht 63.75 in | Wt 136.2 lb

## 2018-12-12 DIAGNOSIS — E1165 Type 2 diabetes mellitus with hyperglycemia: Secondary | ICD-10-CM

## 2018-12-12 DIAGNOSIS — E7800 Pure hypercholesterolemia, unspecified: Secondary | ICD-10-CM

## 2018-12-12 DIAGNOSIS — I160 Hypertensive urgency: Secondary | ICD-10-CM | POA: Diagnosis not present

## 2018-12-12 DIAGNOSIS — N182 Chronic kidney disease, stage 2 (mild): Secondary | ICD-10-CM

## 2018-12-12 DIAGNOSIS — Z9114 Patient's other noncompliance with medication regimen: Secondary | ICD-10-CM

## 2018-12-12 DIAGNOSIS — E1122 Type 2 diabetes mellitus with diabetic chronic kidney disease: Secondary | ICD-10-CM | POA: Diagnosis not present

## 2018-12-12 DIAGNOSIS — IMO0002 Reserved for concepts with insufficient information to code with codable children: Secondary | ICD-10-CM

## 2018-12-12 DIAGNOSIS — Z91148 Patient's other noncompliance with medication regimen for other reason: Secondary | ICD-10-CM

## 2018-12-12 LAB — LIPID PANEL
CHOL/HDL RATIO: 4
Cholesterol: 197 mg/dL (ref 0–200)
HDL: 55.9 mg/dL (ref >39.00)
LDL CALC: 124 mg/dL — AB (ref 0–99)
NonHDL: 141.46
Triglycerides: 87 mg/dL (ref 0.0–149.0)
VLDL: 17.4 mg/dL (ref 0.0–40.0)

## 2018-12-12 LAB — COMPREHENSIVE METABOLIC PANEL
ALT: 17 U/L (ref 0–35)
AST: 19 U/L (ref 0–37)
Albumin: 3.9 g/dL (ref 3.5–5.2)
Alkaline Phosphatase: 119 U/L — ABNORMAL HIGH (ref 39–117)
BUN: 30 mg/dL — AB (ref 6–23)
CHLORIDE: 103 meq/L (ref 96–112)
CO2: 27 meq/L (ref 19–32)
CREATININE: 1.2 mg/dL (ref 0.40–1.20)
Calcium: 9 mg/dL (ref 8.4–10.5)
GFR: 57.21 mL/min — ABNORMAL LOW (ref >60.00)
Glucose, Bld: 121 mg/dL — ABNORMAL HIGH (ref 70–99)
POTASSIUM: 3.9 meq/L (ref 3.5–5.1)
SODIUM: 139 meq/L (ref 135–145)
Total Bilirubin: 0.6 mg/dL (ref 0.2–1.2)
Total Protein: 7.7 g/dL (ref 6.0–8.3)

## 2018-12-12 LAB — HEMOGLOBIN A1C: Hgb A1c MFr Bld: 9.1 % — ABNORMAL HIGH (ref 4.6–6.5)

## 2018-12-12 MED ORDER — CARVEDILOL 12.5 MG PO TABS: 12.5000 mg | ORAL_TABLET | Freq: Two times a day (BID) | ORAL | 1 refills | Status: AC

## 2018-12-12 MED ORDER — METFORMIN HCL 850 MG PO TABS: ORAL_TABLET | ORAL | 3 refills | Status: AC

## 2018-12-12 MED ORDER — LISINOPRIL-HYDROCHLOROTHIAZIDE 20-25 MG PO TABS: 1.0000 | ORAL_TABLET | Freq: Every day | ORAL | 3 refills | Status: AC

## 2018-12-12 MED ORDER — AMLODIPINE BESYLATE 10 MG PO TABS: 10.0000 mg | ORAL_TABLET | Freq: Every day | ORAL | 3 refills | Status: AC

## 2018-12-12 MED ORDER — CLONIDINE HCL 0.2 MG PO TABS: 0.2000 mg | ORAL_TABLET | Freq: Every day | ORAL | 1 refills | Status: AC

## 2018-12-12 MED ORDER — SITAGLIPTIN PHOSPHATE 100 MG PO TABS: 100.0000 mg | ORAL_TABLET | Freq: Every day | ORAL | 1 refills | Status: AC

## 2018-12-12 MED ORDER — GLIPIZIDE 5 MG PO TABS: 5.0000 mg | ORAL_TABLET | Freq: Every day | ORAL | 1 refills | Status: DC

## 2018-12-12 NOTE — Patient Instructions (Addendum)
Your blood pressure is too high :  Increase the carvedilol dose from  6.25 mg to 12. 5  mg every 12 hours .   I will send the higher dose to your new pharmacy for  The 12.5 mg dose   Also resume lisinopril/hct once daily in the morning   You can take the clonidine at bedtime  0.2 mg      For the diabetes:  Reduce the evening  dose of glipizide   to 1 tablet  And continue 1 tablet with breakfast   Continue taking NPH Insulin 12 units at breakfast and 5 units at around  8  Or 9 pm

## 2018-12-12 NOTE — Progress Notes (Signed)
Subjective:  Patient ID: Julie Dominguez, female    DOB: 08-02-1951  Age: 68 y.o. MRN: [id number]  CC: The primary encounter diagnosis was Pure hypercholesterolemia. Diagnoses of Uncontrolled diabetes mellitus with stage 2 chronic kidney disease ([place]), Hypertensive urgency, and Noncompliance with medication treatment due to underuse of medication were also pertinent to this visit.  HPI [facility] presents for follow up on type 2 DM and hypertension  Both are historically uncontrolled due to medication nonadherence.  patient was advised at last visit to stop clonidine  Due to dizziness and her carvedilol dose was increased to 6.25 mg bid. .  She did not return for one week follow up RN viist for bp check .  She has  apparently stopped  taking  lisinopril  .  She  States that she is taking all of hter other medications,  But her refill history was reviewed via phone call to pharmacy and she appears to be out of several of  her medications.    BP:  Wants to resume clonidine at bedtime only. Denies chest pain,  shorteness of breath..  Does not check bp at thome     Did not keep appt with  Eula Fried or Pharm D for management of diabetes.  She cites lack of  Transportation,  [place] .  She has no care and has Medicare.  She is apparently not eligible for Medicaid.   Did not bring log of blood sugars.  States that sugars in the morning  range from 80 to 125 . States that they are Higher in the afternoon and evening.  Responses are vague and often identical to what I have told her they should be.  She recalls that she has  had 4 lows in the past month, heralded by increased hunger, and tremulousness .  Mostly occur in the evening.  Answers are again vague. .   Takes 12 units  Of NPH in the morning and("3 units,  No  5 units"  in the pm before bed.  Delays the morning dose if morning cbg is below 80   Outpatient Medications  Prior to Visit  Medication Sig Dispense Refill  . aspirin 81 MG tablet Take 1 tablet (81 mg total) by mouth daily. 30 tablet 1  . atorvastatin (LIPITOR) 40 MG tablet Take 1 tablet (40 mg total) by mouth daily at 6 PM. 30 tablet 5  . insulin NPH Human (HUMULIN N,NOVOLIN N) 100 UNIT/ML injection 12  UNITS IN THE AM.  5 UNITS IN THE PM 10 mL 4  . amLODipine (NORVASC) 10 MG tablet Take 1 tablet (10 mg total) by mouth daily. 90 tablet 3  . carvedilol (COREG) 6.25 MG tablet Take 1 tablet (6.25 mg total) by mouth 2 (two) times daily with a meal. 60 tablet 2  . glipiZIDE (GLUCOTROL) 5 MG tablet TAKE 1 TABLET BY MOUTH IN THE MORNING AND 2 IN THE EVENING BEFORE MEALS 90 tablet 0  . metFORMIN (GLUCOPHAGE) 850 MG tablet TAKE 1 TABLET BY MOUTH TWICE A DAY WITH MEAL 180 tablet 3  . sitaGLIPtin (JANUVIA) 100 MG tablet Take 1 tablet (100 mg total) by mouth daily. 30 tablet 5  . cloNIDine (CATAPRES) 0.2 MG tablet Take 1 tablet (0.2 mg total) by mouth 2 (two) times daily. (Patient not taking: Reported on 12/12/2018) 60 tablet 5  . lisinopril-hydrochlorothiazide (PRINZIDE,ZESTORETIC) 20-25 MG tablet Take 1 tablet by mouth daily. (  Patient not taking: Reported on 12/12/2018) 90 tablet 3   No facility-administered medications prior to visit.     Review of Systems;  Patient denies headache, fevers, malaise, unintentional weight loss, skin rash, eye pain, sinus congestion and sinus pain, sore throat, dysphagia,  hemoptysis , cough, dyspnea, wheezing, chest pain, palpitations, orthopnea, edema, abdominal pain, nausea, melena, diarrhea, constipation, flank pain, dysuria, hematuria, urinary  Frequency, nocturia, numbness, tingling, seizures,  Focal weakness, Loss of consciousness,  Tremor, insomnia, depression, anxiety, and suicidal ideation.      Objective:  BP (!) 218/92 (BP Location: Left Arm, Patient Position: Sitting, Cuff Size: Normal)   Pulse 71   Temp 98 F (36.7 C) (Oral)   Resp 15   Ht 5' 3.75" (1.619 m)    Wt 136 lb 3.2 oz (61.8 kg)   SpO2 99%   BMI 23.56 kg/m   BP Readings from Last 3 Encounters:  12/12/18 (!) 218/92  08/04/18 (!) 194/70  08/04/18 (!) 194/70    Wt Readings from Last 3 Encounters:  12/12/18 136 lb 3.2 oz (61.8 kg)  08/04/18 138 lb 6.4 oz (62.8 kg)  08/04/18 138 lb 6.4 oz (62.8 kg)    General appearance: alert, cooperative and appears stated age Ears: normal TM's and external ear canals both ears Throat: lips, mucosa, and tongue normal; teeth and gums normal Neck: no adenopathy, no carotid bruit, supple, symmetrical, trachea midline and thyroid not enlarged, symmetric, no tenderness/mass/nodules Back: symmetric, no curvature. ROM normal. No CVA tenderness. Lungs: clear to auscultation bilaterally Heart: regular rate and rhythm, S1, S2 normal, no murmur, click, rub or gallop Abdomen: soft, non-tender; bowel sounds normal; no masses,  no organomegaly Pulses: 2+ and symmetric Skin: Skin color, texture, turgor normal. No rashes or lesions Lymph nodes: Cervical, supraclavicular, and axillary nodes normal.  Lab Results  Component Value Date   HGBA1C 9.1 (H) 12/12/2018   HGBA1C 8.4 (A) 08/04/2018   HGBA1C 8.2 (H) 04/28/2018    Lab Results  Component Value Date   CREATININE 1.20 12/12/2018   CREATININE 1.22 (H) 08/04/2018   CREATININE 1.08 04/28/2018    Lab Results  Component Value Date   WBC 5.6 07/28/2017   HGB 12.8 07/28/2017   HCT 38.4 07/28/2017   PLT 151 07/28/2017   GLUCOSE 121 (H) 12/12/2018   CHOL 197 12/12/2018   TRIG 87.0 12/12/2018   HDL 55.90 12/12/2018   LDLDIRECT 145 (H) 07/29/2016   LDLCALC 124 (H) 12/12/2018   ALT 17 12/12/2018   AST 19 12/12/2018   NA 139 12/12/2018   K 3.9 12/12/2018   CL 103 12/12/2018   CREATININE 1.20 12/12/2018   BUN 30 (H) 12/12/2018   CO2 27 12/12/2018   TSH 2.350 01/18/2017   HGBA1C 9.1 (H) 12/12/2018   MICROALBUR 329.2 (H) 01/24/2018    US Carotid Bilateral (at [facility] Only)  Result Date:  07/29/2017 CLINICAL DATA:  CVA. History of hypertension, diabetes and syncopal episode. EXAM: BILATERAL CAROTID DUPLEX ULTRASOUND TECHNIQUE: Pearline Cables scale imaging, color Doppler and duplex ultrasound were performed of bilateral carotid and vertebral arteries in the neck. COMPARISON:  None. FINDINGS: Criteria: Quantification of carotid stenosis is based on velocity parameters that correlate the residual internal carotid diameter with NASCET-based stenosis levels, using the diameter of the distal internal carotid lumen as the denominator for stenosis measurement. The following velocity measurements were obtained: RIGHT ICA:  76/26 cm/sec CCA:  917/91 cm/sec SYSTOLIC ICA/CCA RATIO:  0.7 DIASTOLIC ICA/CCA RATIO:  1.6 ECA:  139  cm/sec LEFT ICA:  113/34 cm/sec CCA:  643/32 cm/sec SYSTOLIC ICA/CCA RATIO:  0.9 DIASTOLIC ICA/CCA RATIO:  1.6 ECA:  189 cm/sec RIGHT CAROTID ARTERY: There is a minimal amount of mixed echogenic plaque within the right carotid bulb (image 15), extending to involve the origin and proximal aspects of the right internal carotid artery (image 22), not resulting in elevated peak systolic velocities within the right internal carotid artery to suggest a hemodynamically significant stenosis. RIGHT VERTEBRAL ARTERY:  Antegrade flow LEFT CAROTID ARTERY: There is a minimal to moderate amount of atherosclerotic plaque within the left carotid bulb (images 46 and 47), extending to involve the origin and proximal aspects of the left internal carotid artery (image 54), not resulting in elevated peak systolic velocities within the interrogated course of the left internal carotid artery to suggest a hemodynamically significant stenosis. LEFT VERTEBRAL ARTERY:  Antegrade flow IMPRESSION: Minimal to moderate amount of bilateral atherosclerotic plaque, left greater than right, not resulting in a hemodynamically significant stenosis within either internal carotid artery. Electronically Signed   By: Sandi Mariscal M.D.   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 She is  Persistently elevated at each visit , does not return for titration of medications due to transportation issues,  And does not a refill history that suports her claims of adherence .   Will resume nighttime dose of  clonidine and increase carvedilol  Dose  To 12.5 mg bid.   Referring again to [facility] for home visits continue lisinopril and amlodipine   Lab Results  Component Value Date   CREATININE 1.20 12/12/2018   Lab Results  Component Value Date   NA 139 12/12/2018   K 3.9 12/12/2018   CL 103 12/12/2018   CO2 27 12/12/2018         Relevant Medications   carvedilol (COREG) 12.5 MG tablet   lisinopril-hydrochlorothiazide (PRINZIDE,ZESTORETIC) 20-25 MG tablet   amLODipine (NORVASC) 10 MG tablet   cloNIDine (CATAPRES) 0.2 MG tablet   Noncompliance with medication treatment due to underuse of medication    Literacy was confirmed today by asking her to read last visit's AVS.        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  Difficult to adjust meds given reports of  Evening hypoglycemia  And recurrent medication lapses.  Reducing evening glipzide dose ,   Continue nph insulin bid and januvia/metformin for now. Home visit requested of Eula Fried   Lab Results  Component Value Date   HGBA1C 9.1 (H) 12/12/2018         Relevant Medications   lisinopril-hydrochlorothiazide (PRINZIDE,ZESTORETIC) 20-25 MG tablet    metFORMIN (GLUCOPHAGE) 850 MG tablet   sitaGLIPtin (JANUVIA) 100 MG tablet   glipiZIDE (GLUCOTROL) 5 MG tablet   Other Relevant Orders   Hemoglobin A1c (Completed)   Comprehensive metabolic panel (Completed)   Urine Microalbumin w/creat. ratio      I have discontinued Caisley D. Nigg's glipiZIDE. I have  also changed her carvedilol, cloNIDine, and glipiZIDE. Additionally, I am having her maintain her aspirin, atorvastatin, insulin NPH Human, lisinopril-hydrochlorothiazide, amLODipine, metFORMIN, and sitaGLIPtin.  Meds ordered this encounter  Medications  . carvedilol (COREG) 12.5 MG tablet    Sig: Take 1 tablet (12.5 mg total) by mouth 2 (two) times daily with a meal.    Dispense:  180 tablet    Refill:  1    NOTE DOSE INCREASE.  Marland Kitchen lisinopril-hydrochlorothiazide (PRINZIDE,ZESTORETIC) 20-25 MG tablet    Sig: Take 1 tablet by mouth daily.    Dispense:  90 tablet    Refill:  3  . amLODipine (NORVASC) 10 MG tablet    Sig: Take 1 tablet (10 mg total) by mouth daily.    Dispense:  90 tablet    Refill:  3  . DISCONTD: glipiZIDE (GLUCOTROL) 5 MG tablet    Sig: Take 1 tablet (5 mg total) by mouth daily before breakfast.    Dispense:  180 tablet    Refill:  1    Please consider 90 day supplies to promote better adherence  . metFORMIN (GLUCOPHAGE) 850 MG tablet    Sig: TAKE 1 TABLET BY MOUTH TWICE A DAY WITH MEAL    Dispense:  180 tablet    Refill:  3  . sitaGLIPtin (JANUVIA) 100 MG tablet    Sig: Take 1 tablet (100 mg total) by mouth daily.    Dispense:  90 tablet    Refill:  1  . cloNIDine (CATAPRES) 0.2 MG tablet    Sig: Take 1 tablet (0.2 mg total) by mouth at bedtime.    Dispense:  90 tablet    Refill:  1  . glipiZIDE (GLUCOTROL) 5 MG tablet    Sig: Take 1 tablet (5 mg total) by mouth 2 (two) times daily before a meal.    Dispense:  180 tablet    Refill:  1    Please consider 90 day supplies to promote better adherence   A total of 40 minutes was spent with patient more  than half of which was spent in counseling patient on the above mentioned issues , reviewing and explaining recent labs and imaging studies done, and coordination of care. Medications Discontinued During This Encounter  Medication Reason  . carvedilol (COREG) 6.25 MG tablet Reorder  . lisinopril-hydrochlorothiazide (PRINZIDE,ZESTORETIC) 20-25 MG tablet Reorder  . amLODipine (NORVASC) 10 MG tablet Reorder  . glipiZIDE (GLUCOTROL) 5 MG tablet   . metFORMIN (GLUCOPHAGE) 850 MG tablet Reorder  . sitaGLIPtin (JANUVIA) 100 MG tablet Reorder  . cloNIDine (CATAPRES) 0.2 MG tablet   . glipiZIDE (GLUCOTROL) 5 MG tablet     Follow-up: Return in about 3 months (around 03/13/2019) for follow up diabetes.   Crecencio Mc, MD

## 2018-12-13 MED ORDER — GLIPIZIDE 5 MG PO TABS: 5.0000 mg | ORAL_TABLET | Freq: Two times a day (BID) | ORAL | 1 refills | Status: DC

## 2018-12-13 NOTE — Assessment & Plan Note (Signed)
Literacy was confirmed today by asking her to read last visit's AVS.

## 2018-12-13 NOTE — Assessment & Plan Note (Addendum)
Her verbal reports of CBG readings are incongruent with her A1c.  Difficult to adjust meds given reports of  Evening hypoglycemia  And recurrent medication lapses.  Reducing evening glipzide dose ,   Continue nph insulin bid and januvia/metformin for now. Home visit requested of Julie Dominguez   Lab Results  Component Value Date   HGBA1C 9.1 (H) 12/12/2018

## 2018-12-13 NOTE — Assessment & Plan Note (Addendum)
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 She is  Persistently elevated at each visit , does not return for titration of medications due to transportation issues,  And does not a refill history that suports her claims of adherence .   Will resume nighttime dose of  clonidine and increase carvedilol  Dose  To 12.5 mg bid.   Referring again to [place] [facility] for home visits continue lisinopril and amlodipine   Lab Results  Component Value Date   CREATININE 1.20 12/12/2018   Lab Results  Component Value Date   NA 139 12/12/2018   K 3.9 12/12/2018   CL 103 12/12/2018   CO2 27 12/12/2018

## 2018-12-23 ENCOUNTER — Other Ambulatory Visit: Payer: Self-pay | Admitting: Internal Medicine

## 2019-02-01 DIAGNOSIS — L89313 Pressure ulcer of right buttock, stage 3: Secondary | ICD-10-CM | POA: Diagnosis not present

## 2019-02-01 DIAGNOSIS — I63511 Cerebral infarction due to unspecified occlusion or stenosis of right middle cerebral artery: Secondary | ICD-10-CM | POA: Diagnosis not present

## 2019-02-01 DIAGNOSIS — I455 Other specified heart block: Secondary | ICD-10-CM | POA: Diagnosis not present

## 2019-02-01 DIAGNOSIS — I82602 Acute embolism and thrombosis of unspecified veins of left upper extremity: Secondary | ICD-10-CM | POA: Diagnosis not present

## 2019-02-01 DIAGNOSIS — E1165 Type 2 diabetes mellitus with hyperglycemia: Secondary | ICD-10-CM | POA: Diagnosis not present

## 2019-02-01 DIAGNOSIS — R739 Hyperglycemia, unspecified: Secondary | ICD-10-CM | POA: Diagnosis not present

## 2019-02-01 DIAGNOSIS — G253 Myoclonus: Secondary | ICD-10-CM | POA: Diagnosis not present

## 2019-02-01 DIAGNOSIS — R4182 Altered mental status, unspecified: Secondary | ICD-10-CM | POA: Diagnosis not present

## 2019-02-01 DIAGNOSIS — R0902 Hypoxemia: Secondary | ICD-10-CM | POA: Diagnosis not present

## 2019-02-01 DIAGNOSIS — J690 Pneumonitis due to inhalation of food and vomit: Secondary | ICD-10-CM | POA: Diagnosis not present

## 2019-02-01 DIAGNOSIS — I63311 Cerebral infarction due to thrombosis of right middle cerebral artery: Secondary | ICD-10-CM | POA: Diagnosis not present

## 2019-02-01 DIAGNOSIS — J9811 Atelectasis: Secondary | ICD-10-CM | POA: Diagnosis not present

## 2019-02-01 DIAGNOSIS — J150 Pneumonia due to Klebsiella pneumoniae: Secondary | ICD-10-CM | POA: Diagnosis not present

## 2019-02-01 DIAGNOSIS — R197 Diarrhea, unspecified: Secondary | ICD-10-CM | POA: Diagnosis not present

## 2019-02-01 DIAGNOSIS — I10 Essential (primary) hypertension: Secondary | ICD-10-CM | POA: Diagnosis not present

## 2019-02-01 DIAGNOSIS — G8190 Hemiplegia, unspecified affecting unspecified side: Secondary | ICD-10-CM | POA: Diagnosis not present

## 2019-02-01 DIAGNOSIS — I639 Cerebral infarction, unspecified: Secondary | ICD-10-CM | POA: Diagnosis not present

## 2019-02-01 DIAGNOSIS — I6622 Occlusion and stenosis of left posterior cerebral artery: Secondary | ICD-10-CM | POA: Diagnosis not present

## 2019-02-01 DIAGNOSIS — I480 Paroxysmal atrial fibrillation: Secondary | ICD-10-CM | POA: Diagnosis not present

## 2019-02-01 DIAGNOSIS — I6503 Occlusion and stenosis of bilateral vertebral arteries: Secondary | ICD-10-CM | POA: Diagnosis not present

## 2019-02-01 DIAGNOSIS — J9501 Hemorrhage from tracheostomy stoma: Secondary | ICD-10-CM | POA: Diagnosis not present

## 2019-02-01 DIAGNOSIS — G9340 Encephalopathy, unspecified: Secondary | ICD-10-CM | POA: Diagnosis not present

## 2019-02-01 DIAGNOSIS — G9389 Other specified disorders of brain: Secondary | ICD-10-CM | POA: Diagnosis not present

## 2019-02-01 DIAGNOSIS — R1319 Other dysphagia: Secondary | ICD-10-CM | POA: Diagnosis not present

## 2019-02-01 DIAGNOSIS — J90 Pleural effusion, not elsewhere classified: Secondary | ICD-10-CM | POA: Diagnosis not present

## 2019-02-01 DIAGNOSIS — I161 Hypertensive emergency: Secondary | ICD-10-CM | POA: Diagnosis not present

## 2019-02-01 DIAGNOSIS — E041 Nontoxic single thyroid nodule: Secondary | ICD-10-CM | POA: Diagnosis not present

## 2019-02-01 DIAGNOSIS — D649 Anemia, unspecified: Secondary | ICD-10-CM | POA: Diagnosis not present

## 2019-02-01 DIAGNOSIS — A4150 Gram-negative sepsis, unspecified: Secondary | ICD-10-CM | POA: Diagnosis not present

## 2019-02-01 DIAGNOSIS — R000 Tachycardia, unspecified: Secondary | ICD-10-CM | POA: Diagnosis not present

## 2019-02-01 DIAGNOSIS — Z01810 Encounter for preprocedural cardiovascular examination: Secondary | ICD-10-CM | POA: Diagnosis not present

## 2019-02-01 DIAGNOSIS — E160 Drug-induced hypoglycemia without coma: Secondary | ICD-10-CM | POA: Diagnosis not present

## 2019-02-01 DIAGNOSIS — R6521 Severe sepsis with septic shock: Secondary | ICD-10-CM | POA: Diagnosis not present

## 2019-02-01 DIAGNOSIS — I469 Cardiac arrest, cause unspecified: Secondary | ICD-10-CM | POA: Diagnosis not present

## 2019-02-01 DIAGNOSIS — I69391 Dysphagia following cerebral infarction: Secondary | ICD-10-CM | POA: Diagnosis not present

## 2019-02-01 DIAGNOSIS — Y95 Nosocomial condition: Secondary | ICD-10-CM | POA: Diagnosis not present

## 2019-02-01 DIAGNOSIS — D72829 Elevated white blood cell count, unspecified: Secondary | ICD-10-CM | POA: Diagnosis not present

## 2019-02-01 DIAGNOSIS — R0689 Other abnormalities of breathing: Secondary | ICD-10-CM | POA: Diagnosis not present

## 2019-02-01 DIAGNOSIS — E871 Hypo-osmolality and hyponatremia: Secondary | ICD-10-CM | POA: Diagnosis not present

## 2019-02-01 DIAGNOSIS — N182 Chronic kidney disease, stage 2 (mild): Secondary | ICD-10-CM | POA: Diagnosis not present

## 2019-02-01 DIAGNOSIS — R471 Dysarthria and anarthria: Secondary | ICD-10-CM | POA: Diagnosis not present

## 2019-02-01 DIAGNOSIS — I82702 Chronic embolism and thrombosis of unspecified veins of left upper extremity: Secondary | ICD-10-CM | POA: Diagnosis not present

## 2019-02-01 DIAGNOSIS — E1169 Type 2 diabetes mellitus with other specified complication: Secondary | ICD-10-CM | POA: Diagnosis not present

## 2019-02-01 DIAGNOSIS — J811 Chronic pulmonary edema: Secondary | ICD-10-CM | POA: Diagnosis not present

## 2019-02-01 DIAGNOSIS — J810 Acute pulmonary edema: Secondary | ICD-10-CM | POA: Diagnosis not present

## 2019-02-01 DIAGNOSIS — Z1624 Resistance to multiple antibiotics: Secondary | ICD-10-CM | POA: Diagnosis not present

## 2019-02-01 DIAGNOSIS — I63512 Cerebral infarction due to unspecified occlusion or stenosis of left middle cerebral artery: Secondary | ICD-10-CM | POA: Diagnosis not present

## 2019-02-01 DIAGNOSIS — E118 Type 2 diabetes mellitus with unspecified complications: Secondary | ICD-10-CM | POA: Diagnosis not present

## 2019-02-01 DIAGNOSIS — E43 Unspecified severe protein-calorie malnutrition: Secondary | ICD-10-CM | POA: Diagnosis not present

## 2019-02-01 DIAGNOSIS — Z8679 Personal history of other diseases of the circulatory system: Secondary | ICD-10-CM | POA: Diagnosis not present

## 2019-02-01 DIAGNOSIS — Z789 Other specified health status: Secondary | ICD-10-CM | POA: Diagnosis not present

## 2019-02-01 DIAGNOSIS — R569 Unspecified convulsions: Secondary | ICD-10-CM | POA: Diagnosis not present

## 2019-02-01 DIAGNOSIS — Z9911 Dependence on respirator [ventilator] status: Secondary | ICD-10-CM | POA: Diagnosis not present

## 2019-02-01 DIAGNOSIS — T383X5A Adverse effect of insulin and oral hypoglycemic [antidiabetic] drugs, initial encounter: Secondary | ICD-10-CM | POA: Diagnosis not present

## 2019-02-01 DIAGNOSIS — E1122 Type 2 diabetes mellitus with diabetic chronic kidney disease: Secondary | ICD-10-CM | POA: Diagnosis not present

## 2019-02-01 DIAGNOSIS — Z4682 Encounter for fitting and adjustment of non-vascular catheter: Secondary | ICD-10-CM | POA: Diagnosis not present

## 2019-02-01 DIAGNOSIS — R404 Transient alteration of awareness: Secondary | ICD-10-CM | POA: Diagnosis not present

## 2019-02-01 DIAGNOSIS — R918 Other nonspecific abnormal finding of lung field: Secondary | ICD-10-CM | POA: Diagnosis not present

## 2019-02-01 DIAGNOSIS — G460 Middle cerebral artery syndrome: Secondary | ICD-10-CM | POA: Diagnosis not present

## 2019-02-01 DIAGNOSIS — J189 Pneumonia, unspecified organism: Secondary | ICD-10-CM | POA: Diagnosis not present

## 2019-02-01 DIAGNOSIS — R41841 Cognitive communication deficit: Secondary | ICD-10-CM | POA: Diagnosis not present

## 2019-02-01 DIAGNOSIS — M6281 Muscle weakness (generalized): Secondary | ICD-10-CM | POA: Diagnosis not present

## 2019-02-01 DIAGNOSIS — A419 Sepsis, unspecified organism: Secondary | ICD-10-CM | POA: Diagnosis not present

## 2019-02-01 DIAGNOSIS — Z794 Long term (current) use of insulin: Secondary | ICD-10-CM | POA: Diagnosis not present

## 2019-02-01 DIAGNOSIS — I6389 Other cerebral infarction: Secondary | ICD-10-CM | POA: Diagnosis not present

## 2019-02-01 DIAGNOSIS — I82622 Acute embolism and thrombosis of deep veins of left upper extremity: Secondary | ICD-10-CM | POA: Diagnosis not present

## 2019-02-01 DIAGNOSIS — R1312 Dysphagia, oropharyngeal phase: Secondary | ICD-10-CM | POA: Diagnosis not present

## 2019-02-01 DIAGNOSIS — I129 Hypertensive chronic kidney disease with stage 1 through stage 4 chronic kidney disease, or unspecified chronic kidney disease: Secondary | ICD-10-CM | POA: Diagnosis not present

## 2019-02-01 DIAGNOSIS — D638 Anemia in other chronic diseases classified elsewhere: Secondary | ICD-10-CM | POA: Diagnosis not present

## 2019-02-01 DIAGNOSIS — I21A1 Myocardial infarction type 2: Secondary | ICD-10-CM | POA: Diagnosis not present

## 2019-02-01 DIAGNOSIS — Z452 Encounter for adjustment and management of vascular access device: Secondary | ICD-10-CM | POA: Diagnosis not present

## 2019-02-01 DIAGNOSIS — Z931 Gastrostomy status: Secondary | ICD-10-CM | POA: Diagnosis not present

## 2019-02-01 DIAGNOSIS — I4891 Unspecified atrial fibrillation: Secondary | ICD-10-CM | POA: Diagnosis not present

## 2019-02-01 DIAGNOSIS — N184 Chronic kidney disease, stage 4 (severe): Secondary | ICD-10-CM | POA: Diagnosis not present

## 2019-02-01 DIAGNOSIS — E638 Other specified nutritional deficiencies: Secondary | ICD-10-CM | POA: Diagnosis not present

## 2019-02-01 DIAGNOSIS — R1110 Vomiting, unspecified: Secondary | ICD-10-CM | POA: Diagnosis not present

## 2019-02-01 DIAGNOSIS — R29729 NIHSS score 29: Secondary | ICD-10-CM | POA: Diagnosis not present

## 2019-02-01 DIAGNOSIS — J9601 Acute respiratory failure with hypoxia: Secondary | ICD-10-CM | POA: Diagnosis not present

## 2019-02-01 DIAGNOSIS — N179 Acute kidney failure, unspecified: Secondary | ICD-10-CM | POA: Diagnosis not present

## 2019-02-01 DIAGNOSIS — I6381 Other cerebral infarction due to occlusion or stenosis of small artery: Secondary | ICD-10-CM | POA: Diagnosis not present

## 2019-02-01 DIAGNOSIS — K9423 Gastrostomy malfunction: Secondary | ICD-10-CM | POA: Diagnosis not present

## 2019-02-01 DIAGNOSIS — E119 Type 2 diabetes mellitus without complications: Secondary | ICD-10-CM | POA: Diagnosis not present

## 2019-02-01 DIAGNOSIS — R1310 Dysphagia, unspecified: Secondary | ICD-10-CM | POA: Diagnosis not present

## 2019-02-01 DIAGNOSIS — N183 Chronic kidney disease, stage 3 (moderate): Secondary | ICD-10-CM | POA: Diagnosis not present

## 2019-02-01 DIAGNOSIS — J9621 Acute and chronic respiratory failure with hypoxia: Secondary | ICD-10-CM | POA: Diagnosis not present

## 2019-02-01 DIAGNOSIS — Z431 Encounter for attention to gastrostomy: Secondary | ICD-10-CM | POA: Diagnosis not present

## 2019-02-01 DIAGNOSIS — E785 Hyperlipidemia, unspecified: Secondary | ICD-10-CM | POA: Diagnosis not present

## 2019-02-01 DIAGNOSIS — R001 Bradycardia, unspecified: Secondary | ICD-10-CM | POA: Diagnosis not present

## 2019-02-01 DIAGNOSIS — R7989 Other specified abnormal findings of blood chemistry: Secondary | ICD-10-CM | POA: Diagnosis not present

## 2019-02-01 DIAGNOSIS — J9690 Respiratory failure, unspecified, unspecified whether with hypoxia or hypercapnia: Secondary | ICD-10-CM | POA: Diagnosis not present

## 2019-02-01 DIAGNOSIS — I69354 Hemiplegia and hemiparesis following cerebral infarction affecting left non-dominant side: Secondary | ICD-10-CM | POA: Diagnosis not present

## 2019-03-09 DIAGNOSIS — J189 Pneumonia, unspecified organism: Secondary | ICD-10-CM

## 2019-03-09 DIAGNOSIS — I480 Paroxysmal atrial fibrillation: Secondary | ICD-10-CM

## 2019-03-09 DIAGNOSIS — J690 Pneumonitis due to inhalation of food and vomit: Secondary | ICD-10-CM

## 2019-03-09 DIAGNOSIS — J9621 Acute and chronic respiratory failure with hypoxia: Secondary | ICD-10-CM

## 2019-03-13 ENCOUNTER — Encounter: Payer: Self-pay | Admitting: Internal Medicine

## 2019-03-16 DIAGNOSIS — Z789 Other specified health status: Secondary | ICD-10-CM | POA: Diagnosis not present

## 2019-03-16 DIAGNOSIS — N183 Chronic kidney disease, stage 3 (moderate): Secondary | ICD-10-CM | POA: Diagnosis not present

## 2019-03-16 DIAGNOSIS — E1122 Type 2 diabetes mellitus with diabetic chronic kidney disease: Secondary | ICD-10-CM | POA: Diagnosis not present

## 2019-03-16 DIAGNOSIS — E1165 Type 2 diabetes mellitus with hyperglycemia: Secondary | ICD-10-CM | POA: Diagnosis not present

## 2019-03-16 DIAGNOSIS — N182 Chronic kidney disease, stage 2 (mild): Secondary | ICD-10-CM | POA: Diagnosis not present

## 2019-03-17 DIAGNOSIS — I69354 Hemiplegia and hemiparesis following cerebral infarction affecting left non-dominant side: Secondary | ICD-10-CM | POA: Diagnosis not present

## 2019-03-17 DIAGNOSIS — J9691 Respiratory failure, unspecified with hypoxia: Secondary | ICD-10-CM | POA: Diagnosis not present

## 2019-03-17 DIAGNOSIS — Z431 Encounter for attention to gastrostomy: Secondary | ICD-10-CM | POA: Diagnosis not present

## 2019-03-17 DIAGNOSIS — J158 Pneumonia due to other specified bacteria: Secondary | ICD-10-CM | POA: Diagnosis not present

## 2019-03-17 DIAGNOSIS — J9690 Respiratory failure, unspecified, unspecified whether with hypoxia or hypercapnia: Secondary | ICD-10-CM | POA: Diagnosis not present

## 2019-03-17 DIAGNOSIS — R7989 Other specified abnormal findings of blood chemistry: Secondary | ICD-10-CM | POA: Diagnosis not present

## 2019-03-17 DIAGNOSIS — E875 Hyperkalemia: Secondary | ICD-10-CM | POA: Diagnosis not present

## 2019-03-17 DIAGNOSIS — Z1624 Resistance to multiple antibiotics: Secondary | ICD-10-CM | POA: Diagnosis not present

## 2019-03-17 DIAGNOSIS — J9601 Acute respiratory failure with hypoxia: Secondary | ICD-10-CM | POA: Diagnosis not present

## 2019-03-17 DIAGNOSIS — J9611 Chronic respiratory failure with hypoxia: Secondary | ICD-10-CM | POA: Diagnosis not present

## 2019-03-17 DIAGNOSIS — R918 Other nonspecific abnormal finding of lung field: Secondary | ICD-10-CM | POA: Diagnosis not present

## 2019-03-17 DIAGNOSIS — A4150 Gram-negative sepsis, unspecified: Secondary | ICD-10-CM | POA: Diagnosis not present

## 2019-03-17 DIAGNOSIS — I517 Cardiomegaly: Secondary | ICD-10-CM | POA: Diagnosis not present

## 2019-03-17 DIAGNOSIS — R1312 Dysphagia, oropharyngeal phase: Secondary | ICD-10-CM | POA: Diagnosis not present

## 2019-03-17 DIAGNOSIS — R0682 Tachypnea, not elsewhere classified: Secondary | ICD-10-CM | POA: Diagnosis not present

## 2019-03-17 DIAGNOSIS — R5381 Other malaise: Secondary | ICD-10-CM | POA: Diagnosis not present

## 2019-03-17 DIAGNOSIS — J90 Pleural effusion, not elsewhere classified: Secondary | ICD-10-CM | POA: Diagnosis not present

## 2019-03-17 DIAGNOSIS — K2900 Acute gastritis without bleeding: Secondary | ICD-10-CM | POA: Diagnosis not present

## 2019-03-17 DIAGNOSIS — I5043 Acute on chronic combined systolic (congestive) and diastolic (congestive) heart failure: Secondary | ICD-10-CM | POA: Diagnosis not present

## 2019-03-17 DIAGNOSIS — J910 Malignant pleural effusion: Secondary | ICD-10-CM | POA: Diagnosis not present

## 2019-03-17 DIAGNOSIS — I4891 Unspecified atrial fibrillation: Secondary | ICD-10-CM | POA: Diagnosis not present

## 2019-03-17 DIAGNOSIS — G8190 Hemiplegia, unspecified affecting unspecified side: Secondary | ICD-10-CM | POA: Diagnosis not present

## 2019-03-17 DIAGNOSIS — R69 Illness, unspecified: Secondary | ICD-10-CM | POA: Diagnosis not present

## 2019-03-17 DIAGNOSIS — R112 Nausea with vomiting, unspecified: Secondary | ICD-10-CM | POA: Diagnosis not present

## 2019-03-17 DIAGNOSIS — J189 Pneumonia, unspecified organism: Secondary | ICD-10-CM | POA: Diagnosis not present

## 2019-03-17 DIAGNOSIS — Z09 Encounter for follow-up examination after completed treatment for conditions other than malignant neoplasm: Secondary | ICD-10-CM | POA: Diagnosis not present

## 2019-03-17 DIAGNOSIS — K2901 Acute gastritis with bleeding: Secondary | ICD-10-CM | POA: Diagnosis not present

## 2019-03-17 DIAGNOSIS — Z452 Encounter for adjustment and management of vascular access device: Secondary | ICD-10-CM | POA: Diagnosis not present

## 2019-03-17 DIAGNOSIS — B961 Klebsiella pneumoniae [K. pneumoniae] as the cause of diseases classified elsewhere: Secondary | ICD-10-CM | POA: Diagnosis not present

## 2019-03-17 DIAGNOSIS — R6520 Severe sepsis without septic shock: Secondary | ICD-10-CM | POA: Diagnosis not present

## 2019-03-17 DIAGNOSIS — D72829 Elevated white blood cell count, unspecified: Secondary | ICD-10-CM | POA: Diagnosis not present

## 2019-03-17 DIAGNOSIS — J690 Pneumonitis due to inhalation of food and vomit: Secondary | ICD-10-CM | POA: Diagnosis not present

## 2019-03-17 DIAGNOSIS — I6789 Other cerebrovascular disease: Secondary | ICD-10-CM | POA: Diagnosis not present

## 2019-03-17 DIAGNOSIS — J150 Pneumonia due to Klebsiella pneumoniae: Secondary | ICD-10-CM | POA: Diagnosis not present

## 2019-03-17 DIAGNOSIS — R509 Fever, unspecified: Secondary | ICD-10-CM | POA: Diagnosis not present

## 2019-03-17 DIAGNOSIS — E119 Type 2 diabetes mellitus without complications: Secondary | ICD-10-CM | POA: Diagnosis not present

## 2019-03-17 DIAGNOSIS — N189 Chronic kidney disease, unspecified: Secondary | ICD-10-CM | POA: Diagnosis not present

## 2019-03-17 DIAGNOSIS — L89313 Pressure ulcer of right buttock, stage 3: Secondary | ICD-10-CM | POA: Diagnosis not present

## 2019-03-17 DIAGNOSIS — I639 Cerebral infarction, unspecified: Secondary | ICD-10-CM | POA: Diagnosis not present

## 2019-03-17 DIAGNOSIS — R1311 Dysphagia, oral phase: Secondary | ICD-10-CM | POA: Diagnosis not present

## 2019-03-17 DIAGNOSIS — R7881 Bacteremia: Secondary | ICD-10-CM | POA: Diagnosis not present

## 2019-03-17 DIAGNOSIS — Z9911 Dependence on respirator [ventilator] status: Secondary | ICD-10-CM | POA: Diagnosis not present

## 2019-03-17 DIAGNOSIS — E785 Hyperlipidemia, unspecified: Secondary | ICD-10-CM | POA: Diagnosis not present

## 2019-03-17 DIAGNOSIS — F419 Anxiety disorder, unspecified: Secondary | ICD-10-CM | POA: Diagnosis not present

## 2019-03-17 DIAGNOSIS — J9620 Acute and chronic respiratory failure, unspecified whether with hypoxia or hypercapnia: Secondary | ICD-10-CM | POA: Diagnosis not present

## 2019-03-17 DIAGNOSIS — K56609 Unspecified intestinal obstruction, unspecified as to partial versus complete obstruction: Secondary | ICD-10-CM | POA: Diagnosis not present

## 2019-03-17 DIAGNOSIS — J156 Pneumonia due to other aerobic Gram-negative bacteria: Secondary | ICD-10-CM | POA: Diagnosis not present

## 2019-03-17 DIAGNOSIS — I69398 Other sequelae of cerebral infarction: Secondary | ICD-10-CM | POA: Diagnosis not present

## 2019-03-17 DIAGNOSIS — J159 Unspecified bacterial pneumonia: Secondary | ICD-10-CM | POA: Diagnosis not present

## 2019-03-17 DIAGNOSIS — E1122 Type 2 diabetes mellitus with diabetic chronic kidney disease: Secondary | ICD-10-CM | POA: Diagnosis not present

## 2019-03-17 DIAGNOSIS — N182 Chronic kidney disease, stage 2 (mild): Secondary | ICD-10-CM | POA: Diagnosis not present

## 2019-03-17 DIAGNOSIS — K3184 Gastroparesis: Secondary | ICD-10-CM | POA: Diagnosis not present

## 2019-03-17 DIAGNOSIS — R042 Hemoptysis: Secondary | ICD-10-CM | POA: Diagnosis not present

## 2019-03-17 DIAGNOSIS — I129 Hypertensive chronic kidney disease with stage 1 through stage 4 chronic kidney disease, or unspecified chronic kidney disease: Secondary | ICD-10-CM | POA: Diagnosis not present

## 2019-03-17 DIAGNOSIS — J398 Other specified diseases of upper respiratory tract: Secondary | ICD-10-CM | POA: Diagnosis not present

## 2019-03-17 DIAGNOSIS — I509 Heart failure, unspecified: Secondary | ICD-10-CM | POA: Diagnosis not present

## 2019-03-17 DIAGNOSIS — A419 Sepsis, unspecified organism: Secondary | ICD-10-CM | POA: Diagnosis not present

## 2019-03-17 DIAGNOSIS — I6389 Other cerebral infarction: Secondary | ICD-10-CM | POA: Diagnosis not present

## 2019-03-17 DIAGNOSIS — J918 Pleural effusion in other conditions classified elsewhere: Secondary | ICD-10-CM | POA: Diagnosis not present

## 2019-03-17 DIAGNOSIS — Z9989 Dependence on other enabling machines and devices: Secondary | ICD-10-CM | POA: Diagnosis not present

## 2019-03-17 DIAGNOSIS — R2689 Other abnormalities of gait and mobility: Secondary | ICD-10-CM | POA: Diagnosis not present

## 2019-03-17 DIAGNOSIS — Y95 Nosocomial condition: Secondary | ICD-10-CM | POA: Diagnosis not present

## 2019-03-17 DIAGNOSIS — R1310 Dysphagia, unspecified: Secondary | ICD-10-CM | POA: Diagnosis not present

## 2019-03-17 DIAGNOSIS — Z4682 Encounter for fitting and adjustment of non-vascular catheter: Secondary | ICD-10-CM | POA: Diagnosis not present

## 2019-03-17 DIAGNOSIS — J82 Pulmonary eosinophilia, not elsewhere classified: Secondary | ICD-10-CM | POA: Diagnosis not present

## 2019-03-17 DIAGNOSIS — I5033 Acute on chronic diastolic (congestive) heart failure: Secondary | ICD-10-CM | POA: Diagnosis not present

## 2019-03-17 DIAGNOSIS — M6281 Muscle weakness (generalized): Secondary | ICD-10-CM | POA: Diagnosis not present

## 2019-03-17 DIAGNOSIS — R0902 Hypoxemia: Secondary | ICD-10-CM | POA: Diagnosis not present

## 2019-03-17 DIAGNOSIS — I69351 Hemiplegia and hemiparesis following cerebral infarction affecting right dominant side: Secondary | ICD-10-CM | POA: Diagnosis not present

## 2019-03-17 DIAGNOSIS — J9501 Hemorrhage from tracheostomy stoma: Secondary | ICD-10-CM | POA: Diagnosis not present

## 2019-03-17 DIAGNOSIS — E869 Volume depletion, unspecified: Secondary | ICD-10-CM | POA: Diagnosis not present

## 2019-03-17 DIAGNOSIS — R7981 Abnormal blood-gas level: Secondary | ICD-10-CM | POA: Diagnosis not present

## 2019-03-17 DIAGNOSIS — N179 Acute kidney failure, unspecified: Secondary | ICD-10-CM | POA: Diagnosis not present

## 2019-03-17 DIAGNOSIS — E1165 Type 2 diabetes mellitus with hyperglycemia: Secondary | ICD-10-CM | POA: Diagnosis not present

## 2019-03-17 DIAGNOSIS — D631 Anemia in chronic kidney disease: Secondary | ICD-10-CM | POA: Diagnosis not present

## 2019-03-17 DIAGNOSIS — E039 Hypothyroidism, unspecified: Secondary | ICD-10-CM | POA: Diagnosis not present

## 2019-03-17 DIAGNOSIS — I10 Essential (primary) hypertension: Secondary | ICD-10-CM | POA: Diagnosis not present

## 2019-03-17 DIAGNOSIS — J9621 Acute and chronic respiratory failure with hypoxia: Secondary | ICD-10-CM | POA: Diagnosis not present

## 2019-03-17 DIAGNOSIS — R0489 Hemorrhage from other sites in respiratory passages: Secondary | ICD-10-CM | POA: Diagnosis not present

## 2019-03-17 DIAGNOSIS — I480 Paroxysmal atrial fibrillation: Secondary | ICD-10-CM | POA: Diagnosis not present

## 2019-03-18 DIAGNOSIS — Z452 Encounter for adjustment and management of vascular access device: Secondary | ICD-10-CM | POA: Diagnosis not present

## 2019-03-18 DIAGNOSIS — J9621 Acute and chronic respiratory failure with hypoxia: Secondary | ICD-10-CM | POA: Diagnosis not present

## 2019-03-18 DIAGNOSIS — Z9989 Dependence on other enabling machines and devices: Secondary | ICD-10-CM | POA: Diagnosis not present

## 2019-03-18 DIAGNOSIS — R918 Other nonspecific abnormal finding of lung field: Secondary | ICD-10-CM | POA: Diagnosis not present

## 2019-03-18 DIAGNOSIS — J189 Pneumonia, unspecified organism: Secondary | ICD-10-CM | POA: Diagnosis not present

## 2019-03-18 DIAGNOSIS — I4891 Unspecified atrial fibrillation: Secondary | ICD-10-CM | POA: Diagnosis not present

## 2019-03-18 MED ORDER — LISINOPRIL 10 MG PO TABS
15.00 | ORAL_TABLET | ORAL | Status: DC
Start: 2019-03-17 — End: 2019-03-18

## 2019-03-18 MED ORDER — DEXTROSE 50 % IV SOLN
12.50 | INTRAVENOUS | Status: DC
Start: ? — End: 2019-03-18

## 2019-03-18 MED ORDER — APIXABAN 5 MG PO TABS
5.00 | ORAL_TABLET | ORAL | Status: DC
Start: 2019-03-17 — End: 2019-03-18

## 2019-03-18 MED ORDER — HYDRALAZINE HCL 50 MG PO TABS
100.00 | ORAL_TABLET | ORAL | Status: DC
Start: 2019-03-18 — End: 2019-03-18

## 2019-03-18 MED ORDER — GENERIC EXTERNAL MEDICATION
Status: DC
Start: ? — End: 2019-03-18

## 2019-03-18 MED ORDER — VALPROIC ACID 250 MG/5ML PO SOLN
250.00 | ORAL | Status: DC
Start: 2019-03-17 — End: 2019-03-18

## 2019-03-18 MED ORDER — POLYETHYLENE GLYCOL 3350 17 G PO PACK
17.00 | PACK | ORAL | Status: DC
Start: ? — End: 2019-03-18

## 2019-03-18 MED ORDER — CHLORHEXIDINE GLUCONATE 0.12 % MT SOLN
5.00 | OROMUCOSAL | Status: DC
Start: 2019-03-17 — End: 2019-03-18

## 2019-03-18 MED ORDER — INSULIN REGULAR HUMAN 100 UNIT/ML IJ SOLN
0.00 | INTRAMUSCULAR | Status: DC
Start: 2019-03-17 — End: 2019-03-18

## 2019-03-18 MED ORDER — GUAIFENESIN 100 MG/5ML PO SYRP
200.00 | ORAL_SOLUTION | ORAL | Status: DC
Start: ? — End: 2019-03-18

## 2019-03-18 MED ORDER — FUROSEMIDE 20 MG PO TABS
40.00 | ORAL_TABLET | ORAL | Status: DC
Start: 2019-03-18 — End: 2019-03-18

## 2019-03-18 MED ORDER — ATORVASTATIN CALCIUM 80 MG PO TABS
80.00 | ORAL_TABLET | ORAL | Status: DC
Start: 2019-03-17 — End: 2019-03-18

## 2019-03-18 MED ORDER — OXYCODONE HCL 5 MG PO TABS
5.00 | ORAL_TABLET | ORAL | Status: DC
Start: ? — End: 2019-03-18

## 2019-03-18 MED ORDER — NYSTATIN 100000 UNIT/GM EX POWD
CUTANEOUS | Status: DC
Start: 2019-03-17 — End: 2019-03-18

## 2019-03-18 MED ORDER — DEXTROSE 10 % IV SOLN
50.00 | INTRAVENOUS | Status: DC
Start: ? — End: 2019-03-18

## 2019-03-18 MED ORDER — AMLODIPINE BESYLATE 10 MG PO TABS
10.00 | ORAL_TABLET | ORAL | Status: DC
Start: 2019-03-18 — End: 2019-03-18

## 2019-03-18 MED ORDER — INSULIN REGULAR HUMAN 100 UNIT/ML IJ SOLN
12.00 | INTRAMUSCULAR | Status: DC
Start: 2019-03-17 — End: 2019-03-18

## 2019-03-18 MED ORDER — HYDRALAZINE HCL 20 MG/ML IJ SOLN
10.00 | INTRAMUSCULAR | Status: DC
Start: ? — End: 2019-03-18

## 2019-03-18 MED ORDER — ACETAMINOPHEN 325 MG PO TABS
975.00 | ORAL_TABLET | ORAL | Status: DC
Start: ? — End: 2019-03-18

## 2019-03-18 MED ORDER — GLUCAGON HCL RDNA (DIAGNOSTIC) 1 MG IJ SOLR
1.00 | INTRAMUSCULAR | Status: DC
Start: ? — End: 2019-03-18

## 2019-03-18 MED ORDER — ONDANSETRON HCL 4 MG/2ML IJ SOLN
4.00 | INTRAMUSCULAR | Status: DC
Start: ? — End: 2019-03-18

## 2019-03-18 MED ORDER — SENNOSIDES-DOCUSATE SODIUM 8.6-50 MG PO TABS
2.00 | ORAL_TABLET | ORAL | Status: DC
Start: ? — End: 2019-03-18

## 2019-03-19 DIAGNOSIS — J189 Pneumonia, unspecified organism: Secondary | ICD-10-CM | POA: Diagnosis not present

## 2019-03-19 DIAGNOSIS — J9621 Acute and chronic respiratory failure with hypoxia: Secondary | ICD-10-CM | POA: Diagnosis not present

## 2019-03-19 DIAGNOSIS — Z9989 Dependence on other enabling machines and devices: Secondary | ICD-10-CM | POA: Diagnosis not present

## 2019-03-20 ENCOUNTER — Ambulatory Visit: Payer: Medicare HMO | Admitting: Internal Medicine

## 2019-03-20 DIAGNOSIS — E1122 Type 2 diabetes mellitus with diabetic chronic kidney disease: Secondary | ICD-10-CM | POA: Diagnosis not present

## 2019-03-20 DIAGNOSIS — I69354 Hemiplegia and hemiparesis following cerebral infarction affecting left non-dominant side: Secondary | ICD-10-CM | POA: Diagnosis not present

## 2019-03-20 DIAGNOSIS — N182 Chronic kidney disease, stage 2 (mild): Secondary | ICD-10-CM | POA: Diagnosis not present

## 2019-03-20 DIAGNOSIS — J189 Pneumonia, unspecified organism: Secondary | ICD-10-CM | POA: Diagnosis not present

## 2019-03-20 DIAGNOSIS — J690 Pneumonitis due to inhalation of food and vomit: Secondary | ICD-10-CM | POA: Diagnosis not present

## 2019-03-20 DIAGNOSIS — J9621 Acute and chronic respiratory failure with hypoxia: Secondary | ICD-10-CM | POA: Diagnosis not present

## 2019-03-20 DIAGNOSIS — I480 Paroxysmal atrial fibrillation: Secondary | ICD-10-CM | POA: Diagnosis not present

## 2019-03-21 DIAGNOSIS — I6389 Other cerebral infarction: Secondary | ICD-10-CM | POA: Diagnosis not present

## 2019-03-21 DIAGNOSIS — J690 Pneumonitis due to inhalation of food and vomit: Secondary | ICD-10-CM | POA: Diagnosis not present

## 2019-03-21 DIAGNOSIS — R1312 Dysphagia, oropharyngeal phase: Secondary | ICD-10-CM | POA: Diagnosis not present

## 2019-03-21 DIAGNOSIS — J9621 Acute and chronic respiratory failure with hypoxia: Secondary | ICD-10-CM | POA: Diagnosis not present

## 2019-03-21 DIAGNOSIS — I480 Paroxysmal atrial fibrillation: Secondary | ICD-10-CM | POA: Diagnosis not present

## 2019-03-21 DIAGNOSIS — E1165 Type 2 diabetes mellitus with hyperglycemia: Secondary | ICD-10-CM | POA: Diagnosis not present

## 2019-03-21 DIAGNOSIS — J189 Pneumonia, unspecified organism: Secondary | ICD-10-CM | POA: Diagnosis not present

## 2019-03-22 DIAGNOSIS — J690 Pneumonitis due to inhalation of food and vomit: Secondary | ICD-10-CM

## 2019-03-22 DIAGNOSIS — E1122 Type 2 diabetes mellitus with diabetic chronic kidney disease: Secondary | ICD-10-CM | POA: Diagnosis not present

## 2019-03-22 DIAGNOSIS — I69354 Hemiplegia and hemiparesis following cerebral infarction affecting left non-dominant side: Secondary | ICD-10-CM | POA: Diagnosis not present

## 2019-03-22 DIAGNOSIS — N182 Chronic kidney disease, stage 2 (mild): Secondary | ICD-10-CM | POA: Diagnosis not present

## 2019-03-22 DIAGNOSIS — J9621 Acute and chronic respiratory failure with hypoxia: Secondary | ICD-10-CM

## 2019-03-22 DIAGNOSIS — J189 Pneumonia, unspecified organism: Secondary | ICD-10-CM

## 2019-03-22 DIAGNOSIS — I480 Paroxysmal atrial fibrillation: Secondary | ICD-10-CM

## 2019-03-23 DIAGNOSIS — J189 Pneumonia, unspecified organism: Secondary | ICD-10-CM | POA: Diagnosis not present

## 2019-03-23 DIAGNOSIS — I6389 Other cerebral infarction: Secondary | ICD-10-CM | POA: Diagnosis not present

## 2019-03-23 DIAGNOSIS — J9621 Acute and chronic respiratory failure with hypoxia: Secondary | ICD-10-CM | POA: Diagnosis not present

## 2019-03-23 DIAGNOSIS — R1312 Dysphagia, oropharyngeal phase: Secondary | ICD-10-CM | POA: Diagnosis not present

## 2019-03-23 DIAGNOSIS — E1165 Type 2 diabetes mellitus with hyperglycemia: Secondary | ICD-10-CM | POA: Diagnosis not present

## 2019-03-23 DIAGNOSIS — I480 Paroxysmal atrial fibrillation: Secondary | ICD-10-CM | POA: Diagnosis not present

## 2019-03-23 DIAGNOSIS — J690 Pneumonitis due to inhalation of food and vomit: Secondary | ICD-10-CM | POA: Diagnosis not present

## 2019-03-24 DIAGNOSIS — E1165 Type 2 diabetes mellitus with hyperglycemia: Secondary | ICD-10-CM | POA: Diagnosis not present

## 2019-03-24 DIAGNOSIS — R1312 Dysphagia, oropharyngeal phase: Secondary | ICD-10-CM | POA: Diagnosis not present

## 2019-03-24 DIAGNOSIS — I480 Paroxysmal atrial fibrillation: Secondary | ICD-10-CM | POA: Diagnosis not present

## 2019-03-24 DIAGNOSIS — J690 Pneumonitis due to inhalation of food and vomit: Secondary | ICD-10-CM | POA: Diagnosis not present

## 2019-03-24 DIAGNOSIS — J9621 Acute and chronic respiratory failure with hypoxia: Secondary | ICD-10-CM | POA: Diagnosis not present

## 2019-03-24 DIAGNOSIS — J189 Pneumonia, unspecified organism: Secondary | ICD-10-CM | POA: Diagnosis not present

## 2019-03-24 DIAGNOSIS — I6389 Other cerebral infarction: Secondary | ICD-10-CM | POA: Diagnosis not present

## 2019-03-25 DIAGNOSIS — R1312 Dysphagia, oropharyngeal phase: Secondary | ICD-10-CM | POA: Diagnosis not present

## 2019-03-25 DIAGNOSIS — I480 Paroxysmal atrial fibrillation: Secondary | ICD-10-CM | POA: Diagnosis not present

## 2019-03-25 DIAGNOSIS — E1165 Type 2 diabetes mellitus with hyperglycemia: Secondary | ICD-10-CM | POA: Diagnosis not present

## 2019-03-25 DIAGNOSIS — J690 Pneumonitis due to inhalation of food and vomit: Secondary | ICD-10-CM | POA: Diagnosis not present

## 2019-03-25 DIAGNOSIS — J189 Pneumonia, unspecified organism: Secondary | ICD-10-CM | POA: Diagnosis not present

## 2019-03-25 DIAGNOSIS — I6389 Other cerebral infarction: Secondary | ICD-10-CM | POA: Diagnosis not present

## 2019-03-25 DIAGNOSIS — J9621 Acute and chronic respiratory failure with hypoxia: Secondary | ICD-10-CM | POA: Diagnosis not present

## 2019-03-26 DIAGNOSIS — I480 Paroxysmal atrial fibrillation: Secondary | ICD-10-CM | POA: Diagnosis not present

## 2019-03-26 DIAGNOSIS — E1165 Type 2 diabetes mellitus with hyperglycemia: Secondary | ICD-10-CM | POA: Diagnosis not present

## 2019-03-26 DIAGNOSIS — J159 Unspecified bacterial pneumonia: Secondary | ICD-10-CM | POA: Diagnosis not present

## 2019-03-26 DIAGNOSIS — J189 Pneumonia, unspecified organism: Secondary | ICD-10-CM | POA: Diagnosis not present

## 2019-03-26 DIAGNOSIS — I6389 Other cerebral infarction: Secondary | ICD-10-CM | POA: Diagnosis not present

## 2019-03-26 DIAGNOSIS — J9621 Acute and chronic respiratory failure with hypoxia: Secondary | ICD-10-CM | POA: Diagnosis not present

## 2019-03-26 DIAGNOSIS — J690 Pneumonitis due to inhalation of food and vomit: Secondary | ICD-10-CM | POA: Diagnosis not present

## 2019-03-26 DIAGNOSIS — R1312 Dysphagia, oropharyngeal phase: Secondary | ICD-10-CM | POA: Diagnosis not present

## 2019-03-27 DIAGNOSIS — R5381 Other malaise: Secondary | ICD-10-CM | POA: Diagnosis not present

## 2019-03-27 DIAGNOSIS — E1122 Type 2 diabetes mellitus with diabetic chronic kidney disease: Secondary | ICD-10-CM | POA: Diagnosis not present

## 2019-03-27 DIAGNOSIS — E1165 Type 2 diabetes mellitus with hyperglycemia: Secondary | ICD-10-CM | POA: Diagnosis not present

## 2019-03-27 DIAGNOSIS — R2689 Other abnormalities of gait and mobility: Secondary | ICD-10-CM | POA: Diagnosis not present

## 2019-03-27 DIAGNOSIS — J690 Pneumonitis due to inhalation of food and vomit: Secondary | ICD-10-CM | POA: Diagnosis not present

## 2019-03-27 DIAGNOSIS — I69351 Hemiplegia and hemiparesis following cerebral infarction affecting right dominant side: Secondary | ICD-10-CM | POA: Diagnosis not present

## 2019-03-27 DIAGNOSIS — N189 Chronic kidney disease, unspecified: Secondary | ICD-10-CM | POA: Diagnosis not present

## 2019-03-27 DIAGNOSIS — I480 Paroxysmal atrial fibrillation: Secondary | ICD-10-CM | POA: Diagnosis not present

## 2019-03-27 DIAGNOSIS — J9601 Acute respiratory failure with hypoxia: Secondary | ICD-10-CM | POA: Diagnosis not present

## 2019-03-27 DIAGNOSIS — N179 Acute kidney failure, unspecified: Secondary | ICD-10-CM | POA: Diagnosis not present

## 2019-03-27 DIAGNOSIS — D72829 Elevated white blood cell count, unspecified: Secondary | ICD-10-CM | POA: Diagnosis not present

## 2019-03-27 DIAGNOSIS — J150 Pneumonia due to Klebsiella pneumoniae: Secondary | ICD-10-CM | POA: Diagnosis not present

## 2019-03-27 DIAGNOSIS — J9621 Acute and chronic respiratory failure with hypoxia: Secondary | ICD-10-CM | POA: Diagnosis not present

## 2019-03-27 DIAGNOSIS — D631 Anemia in chronic kidney disease: Secondary | ICD-10-CM | POA: Diagnosis not present

## 2019-03-27 DIAGNOSIS — I69398 Other sequelae of cerebral infarction: Secondary | ICD-10-CM | POA: Diagnosis not present

## 2019-03-27 DIAGNOSIS — N182 Chronic kidney disease, stage 2 (mild): Secondary | ICD-10-CM | POA: Diagnosis not present

## 2019-03-27 DIAGNOSIS — I129 Hypertensive chronic kidney disease with stage 1 through stage 4 chronic kidney disease, or unspecified chronic kidney disease: Secondary | ICD-10-CM | POA: Diagnosis not present

## 2019-03-27 DIAGNOSIS — Z9989 Dependence on other enabling machines and devices: Secondary | ICD-10-CM | POA: Diagnosis not present

## 2019-03-27 DIAGNOSIS — J189 Pneumonia, unspecified organism: Secondary | ICD-10-CM | POA: Diagnosis not present

## 2019-03-27 DIAGNOSIS — E875 Hyperkalemia: Secondary | ICD-10-CM | POA: Diagnosis not present

## 2019-03-27 DIAGNOSIS — R1310 Dysphagia, unspecified: Secondary | ICD-10-CM | POA: Diagnosis not present

## 2019-03-27 DIAGNOSIS — M6281 Muscle weakness (generalized): Secondary | ICD-10-CM | POA: Diagnosis not present

## 2019-03-27 DIAGNOSIS — I69354 Hemiplegia and hemiparesis following cerebral infarction affecting left non-dominant side: Secondary | ICD-10-CM | POA: Diagnosis not present

## 2019-03-28 DIAGNOSIS — E1122 Type 2 diabetes mellitus with diabetic chronic kidney disease: Secondary | ICD-10-CM | POA: Diagnosis not present

## 2019-03-28 DIAGNOSIS — E875 Hyperkalemia: Secondary | ICD-10-CM | POA: Diagnosis not present

## 2019-03-28 DIAGNOSIS — N182 Chronic kidney disease, stage 2 (mild): Secondary | ICD-10-CM | POA: Diagnosis not present

## 2019-03-28 DIAGNOSIS — N179 Acute kidney failure, unspecified: Secondary | ICD-10-CM | POA: Diagnosis not present

## 2019-03-28 DIAGNOSIS — J159 Unspecified bacterial pneumonia: Secondary | ICD-10-CM | POA: Diagnosis not present

## 2019-03-28 DIAGNOSIS — E1165 Type 2 diabetes mellitus with hyperglycemia: Secondary | ICD-10-CM | POA: Diagnosis not present

## 2019-03-28 DIAGNOSIS — J9621 Acute and chronic respiratory failure with hypoxia: Secondary | ICD-10-CM | POA: Diagnosis not present

## 2019-03-28 DIAGNOSIS — D631 Anemia in chronic kidney disease: Secondary | ICD-10-CM | POA: Diagnosis not present

## 2019-03-28 DIAGNOSIS — R042 Hemoptysis: Secondary | ICD-10-CM | POA: Diagnosis not present

## 2019-03-28 DIAGNOSIS — I6389 Other cerebral infarction: Secondary | ICD-10-CM | POA: Diagnosis not present

## 2019-03-28 DIAGNOSIS — R1312 Dysphagia, oropharyngeal phase: Secondary | ICD-10-CM | POA: Diagnosis not present

## 2019-03-28 DIAGNOSIS — J189 Pneumonia, unspecified organism: Secondary | ICD-10-CM | POA: Diagnosis not present

## 2019-03-28 DIAGNOSIS — Z9989 Dependence on other enabling machines and devices: Secondary | ICD-10-CM | POA: Diagnosis not present

## 2019-03-28 DIAGNOSIS — J150 Pneumonia due to Klebsiella pneumoniae: Secondary | ICD-10-CM | POA: Diagnosis not present

## 2019-03-28 DIAGNOSIS — R0902 Hypoxemia: Secondary | ICD-10-CM | POA: Diagnosis not present

## 2019-03-28 DIAGNOSIS — I129 Hypertensive chronic kidney disease with stage 1 through stage 4 chronic kidney disease, or unspecified chronic kidney disease: Secondary | ICD-10-CM | POA: Diagnosis not present

## 2019-03-29 DIAGNOSIS — J9621 Acute and chronic respiratory failure with hypoxia: Secondary | ICD-10-CM | POA: Diagnosis not present

## 2019-03-29 DIAGNOSIS — J150 Pneumonia due to Klebsiella pneumoniae: Secondary | ICD-10-CM | POA: Diagnosis not present

## 2019-03-29 DIAGNOSIS — E1165 Type 2 diabetes mellitus with hyperglycemia: Secondary | ICD-10-CM | POA: Diagnosis not present

## 2019-03-29 DIAGNOSIS — Z9989 Dependence on other enabling machines and devices: Secondary | ICD-10-CM | POA: Diagnosis not present

## 2019-03-29 DIAGNOSIS — D631 Anemia in chronic kidney disease: Secondary | ICD-10-CM | POA: Diagnosis not present

## 2019-03-29 DIAGNOSIS — E1122 Type 2 diabetes mellitus with diabetic chronic kidney disease: Secondary | ICD-10-CM | POA: Diagnosis not present

## 2019-03-29 DIAGNOSIS — E875 Hyperkalemia: Secondary | ICD-10-CM | POA: Diagnosis not present

## 2019-03-29 DIAGNOSIS — I129 Hypertensive chronic kidney disease with stage 1 through stage 4 chronic kidney disease, or unspecified chronic kidney disease: Secondary | ICD-10-CM | POA: Diagnosis not present

## 2019-03-29 DIAGNOSIS — N182 Chronic kidney disease, stage 2 (mild): Secondary | ICD-10-CM | POA: Diagnosis not present

## 2019-03-29 DIAGNOSIS — J189 Pneumonia, unspecified organism: Secondary | ICD-10-CM | POA: Diagnosis not present

## 2019-03-29 DIAGNOSIS — N179 Acute kidney failure, unspecified: Secondary | ICD-10-CM | POA: Diagnosis not present

## 2019-03-29 DIAGNOSIS — I480 Paroxysmal atrial fibrillation: Secondary | ICD-10-CM | POA: Diagnosis not present

## 2019-03-29 DIAGNOSIS — I69354 Hemiplegia and hemiparesis following cerebral infarction affecting left non-dominant side: Secondary | ICD-10-CM | POA: Diagnosis not present

## 2019-03-30 DIAGNOSIS — J159 Unspecified bacterial pneumonia: Secondary | ICD-10-CM | POA: Diagnosis not present

## 2019-03-30 DIAGNOSIS — R69 Illness, unspecified: Secondary | ICD-10-CM | POA: Diagnosis not present

## 2019-03-30 DIAGNOSIS — J189 Pneumonia, unspecified organism: Secondary | ICD-10-CM | POA: Diagnosis not present

## 2019-03-30 DIAGNOSIS — I69398 Other sequelae of cerebral infarction: Secondary | ICD-10-CM | POA: Diagnosis not present

## 2019-03-30 DIAGNOSIS — E875 Hyperkalemia: Secondary | ICD-10-CM | POA: Diagnosis not present

## 2019-03-30 DIAGNOSIS — Z9989 Dependence on other enabling machines and devices: Secondary | ICD-10-CM | POA: Diagnosis not present

## 2019-03-30 DIAGNOSIS — N182 Chronic kidney disease, stage 2 (mild): Secondary | ICD-10-CM | POA: Diagnosis not present

## 2019-03-30 DIAGNOSIS — R2689 Other abnormalities of gait and mobility: Secondary | ICD-10-CM | POA: Diagnosis not present

## 2019-03-30 DIAGNOSIS — D631 Anemia in chronic kidney disease: Secondary | ICD-10-CM | POA: Diagnosis not present

## 2019-03-30 DIAGNOSIS — F419 Anxiety disorder, unspecified: Secondary | ICD-10-CM | POA: Diagnosis not present

## 2019-03-30 DIAGNOSIS — R1312 Dysphagia, oropharyngeal phase: Secondary | ICD-10-CM | POA: Diagnosis not present

## 2019-03-30 DIAGNOSIS — E1122 Type 2 diabetes mellitus with diabetic chronic kidney disease: Secondary | ICD-10-CM | POA: Diagnosis not present

## 2019-03-30 DIAGNOSIS — J150 Pneumonia due to Klebsiella pneumoniae: Secondary | ICD-10-CM | POA: Diagnosis not present

## 2019-03-30 DIAGNOSIS — I129 Hypertensive chronic kidney disease with stage 1 through stage 4 chronic kidney disease, or unspecified chronic kidney disease: Secondary | ICD-10-CM | POA: Diagnosis not present

## 2019-03-30 DIAGNOSIS — M6281 Muscle weakness (generalized): Secondary | ICD-10-CM | POA: Diagnosis not present

## 2019-03-30 DIAGNOSIS — I6389 Other cerebral infarction: Secondary | ICD-10-CM | POA: Diagnosis not present

## 2019-03-30 DIAGNOSIS — R5381 Other malaise: Secondary | ICD-10-CM | POA: Diagnosis not present

## 2019-03-30 DIAGNOSIS — N179 Acute kidney failure, unspecified: Secondary | ICD-10-CM | POA: Diagnosis not present

## 2019-03-30 DIAGNOSIS — E1165 Type 2 diabetes mellitus with hyperglycemia: Secondary | ICD-10-CM | POA: Diagnosis not present

## 2019-03-30 DIAGNOSIS — J9621 Acute and chronic respiratory failure with hypoxia: Secondary | ICD-10-CM | POA: Diagnosis not present

## 2019-03-31 DIAGNOSIS — E875 Hyperkalemia: Secondary | ICD-10-CM | POA: Diagnosis not present

## 2019-03-31 DIAGNOSIS — J150 Pneumonia due to Klebsiella pneumoniae: Secondary | ICD-10-CM | POA: Diagnosis not present

## 2019-03-31 DIAGNOSIS — N182 Chronic kidney disease, stage 2 (mild): Secondary | ICD-10-CM | POA: Diagnosis not present

## 2019-03-31 DIAGNOSIS — E1165 Type 2 diabetes mellitus with hyperglycemia: Secondary | ICD-10-CM | POA: Diagnosis not present

## 2019-03-31 DIAGNOSIS — J189 Pneumonia, unspecified organism: Secondary | ICD-10-CM | POA: Diagnosis not present

## 2019-03-31 DIAGNOSIS — D631 Anemia in chronic kidney disease: Secondary | ICD-10-CM | POA: Diagnosis not present

## 2019-03-31 DIAGNOSIS — J9621 Acute and chronic respiratory failure with hypoxia: Secondary | ICD-10-CM | POA: Diagnosis not present

## 2019-03-31 DIAGNOSIS — I129 Hypertensive chronic kidney disease with stage 1 through stage 4 chronic kidney disease, or unspecified chronic kidney disease: Secondary | ICD-10-CM | POA: Diagnosis not present

## 2019-03-31 DIAGNOSIS — I69354 Hemiplegia and hemiparesis following cerebral infarction affecting left non-dominant side: Secondary | ICD-10-CM | POA: Diagnosis not present

## 2019-03-31 DIAGNOSIS — I480 Paroxysmal atrial fibrillation: Secondary | ICD-10-CM | POA: Diagnosis not present

## 2019-03-31 DIAGNOSIS — Z9989 Dependence on other enabling machines and devices: Secondary | ICD-10-CM | POA: Diagnosis not present

## 2019-03-31 DIAGNOSIS — E1122 Type 2 diabetes mellitus with diabetic chronic kidney disease: Secondary | ICD-10-CM | POA: Diagnosis not present

## 2019-03-31 DIAGNOSIS — N179 Acute kidney failure, unspecified: Secondary | ICD-10-CM | POA: Diagnosis not present

## 2019-04-01 DIAGNOSIS — N182 Chronic kidney disease, stage 2 (mild): Secondary | ICD-10-CM | POA: Diagnosis not present

## 2019-04-01 DIAGNOSIS — J9621 Acute and chronic respiratory failure with hypoxia: Secondary | ICD-10-CM | POA: Diagnosis not present

## 2019-04-01 DIAGNOSIS — J189 Pneumonia, unspecified organism: Secondary | ICD-10-CM | POA: Diagnosis not present

## 2019-04-01 DIAGNOSIS — J150 Pneumonia due to Klebsiella pneumoniae: Secondary | ICD-10-CM | POA: Diagnosis not present

## 2019-04-01 DIAGNOSIS — Z9989 Dependence on other enabling machines and devices: Secondary | ICD-10-CM | POA: Diagnosis not present

## 2019-04-01 DIAGNOSIS — I69354 Hemiplegia and hemiparesis following cerebral infarction affecting left non-dominant side: Secondary | ICD-10-CM | POA: Diagnosis not present

## 2019-04-01 DIAGNOSIS — E1122 Type 2 diabetes mellitus with diabetic chronic kidney disease: Secondary | ICD-10-CM | POA: Diagnosis not present

## 2019-04-01 DIAGNOSIS — I480 Paroxysmal atrial fibrillation: Secondary | ICD-10-CM | POA: Diagnosis not present

## 2019-04-02 DIAGNOSIS — J9621 Acute and chronic respiratory failure with hypoxia: Secondary | ICD-10-CM | POA: Diagnosis not present

## 2019-04-02 DIAGNOSIS — Z9989 Dependence on other enabling machines and devices: Secondary | ICD-10-CM | POA: Diagnosis not present

## 2019-04-02 DIAGNOSIS — I480 Paroxysmal atrial fibrillation: Secondary | ICD-10-CM | POA: Diagnosis not present

## 2019-04-02 DIAGNOSIS — N182 Chronic kidney disease, stage 2 (mild): Secondary | ICD-10-CM | POA: Diagnosis not present

## 2019-04-02 DIAGNOSIS — E1122 Type 2 diabetes mellitus with diabetic chronic kidney disease: Secondary | ICD-10-CM | POA: Diagnosis not present

## 2019-04-02 DIAGNOSIS — I69354 Hemiplegia and hemiparesis following cerebral infarction affecting left non-dominant side: Secondary | ICD-10-CM | POA: Diagnosis not present

## 2019-04-02 DIAGNOSIS — J189 Pneumonia, unspecified organism: Secondary | ICD-10-CM | POA: Diagnosis not present

## 2019-04-02 DIAGNOSIS — J150 Pneumonia due to Klebsiella pneumoniae: Secondary | ICD-10-CM | POA: Diagnosis not present

## 2019-04-03 DIAGNOSIS — I480 Paroxysmal atrial fibrillation: Secondary | ICD-10-CM | POA: Diagnosis not present

## 2019-04-03 DIAGNOSIS — I129 Hypertensive chronic kidney disease with stage 1 through stage 4 chronic kidney disease, or unspecified chronic kidney disease: Secondary | ICD-10-CM | POA: Diagnosis not present

## 2019-04-03 DIAGNOSIS — E1122 Type 2 diabetes mellitus with diabetic chronic kidney disease: Secondary | ICD-10-CM | POA: Diagnosis not present

## 2019-04-03 DIAGNOSIS — J9621 Acute and chronic respiratory failure with hypoxia: Secondary | ICD-10-CM | POA: Diagnosis not present

## 2019-04-03 DIAGNOSIS — I69398 Other sequelae of cerebral infarction: Secondary | ICD-10-CM | POA: Diagnosis not present

## 2019-04-03 DIAGNOSIS — M6281 Muscle weakness (generalized): Secondary | ICD-10-CM | POA: Diagnosis not present

## 2019-04-03 DIAGNOSIS — J189 Pneumonia, unspecified organism: Secondary | ICD-10-CM | POA: Diagnosis not present

## 2019-04-03 DIAGNOSIS — R2689 Other abnormalities of gait and mobility: Secondary | ICD-10-CM | POA: Diagnosis not present

## 2019-04-03 DIAGNOSIS — N179 Acute kidney failure, unspecified: Secondary | ICD-10-CM | POA: Diagnosis not present

## 2019-04-03 DIAGNOSIS — J690 Pneumonitis due to inhalation of food and vomit: Secondary | ICD-10-CM | POA: Diagnosis not present

## 2019-04-03 DIAGNOSIS — F419 Anxiety disorder, unspecified: Secondary | ICD-10-CM | POA: Diagnosis not present

## 2019-04-03 DIAGNOSIS — D631 Anemia in chronic kidney disease: Secondary | ICD-10-CM | POA: Diagnosis not present

## 2019-04-03 DIAGNOSIS — E1165 Type 2 diabetes mellitus with hyperglycemia: Secondary | ICD-10-CM | POA: Diagnosis not present

## 2019-04-03 DIAGNOSIS — I69354 Hemiplegia and hemiparesis following cerebral infarction affecting left non-dominant side: Secondary | ICD-10-CM | POA: Diagnosis not present

## 2019-04-03 DIAGNOSIS — R5381 Other malaise: Secondary | ICD-10-CM | POA: Diagnosis not present

## 2019-04-03 DIAGNOSIS — E875 Hyperkalemia: Secondary | ICD-10-CM | POA: Diagnosis not present

## 2019-04-03 DIAGNOSIS — R69 Illness, unspecified: Secondary | ICD-10-CM | POA: Diagnosis not present

## 2019-04-03 DIAGNOSIS — N182 Chronic kidney disease, stage 2 (mild): Secondary | ICD-10-CM | POA: Diagnosis not present

## 2019-04-04 DIAGNOSIS — R7989 Other specified abnormal findings of blood chemistry: Secondary | ICD-10-CM | POA: Diagnosis not present

## 2019-04-04 DIAGNOSIS — J9621 Acute and chronic respiratory failure with hypoxia: Secondary | ICD-10-CM | POA: Diagnosis not present

## 2019-04-04 DIAGNOSIS — N179 Acute kidney failure, unspecified: Secondary | ICD-10-CM | POA: Diagnosis not present

## 2019-04-04 DIAGNOSIS — R1312 Dysphagia, oropharyngeal phase: Secondary | ICD-10-CM | POA: Diagnosis not present

## 2019-04-04 DIAGNOSIS — E1165 Type 2 diabetes mellitus with hyperglycemia: Secondary | ICD-10-CM | POA: Diagnosis not present

## 2019-04-04 DIAGNOSIS — I480 Paroxysmal atrial fibrillation: Secondary | ICD-10-CM | POA: Diagnosis not present

## 2019-04-04 DIAGNOSIS — J159 Unspecified bacterial pneumonia: Secondary | ICD-10-CM | POA: Diagnosis not present

## 2019-04-04 DIAGNOSIS — J189 Pneumonia, unspecified organism: Secondary | ICD-10-CM | POA: Diagnosis not present

## 2019-04-04 DIAGNOSIS — J690 Pneumonitis due to inhalation of food and vomit: Secondary | ICD-10-CM | POA: Diagnosis not present

## 2019-04-04 DIAGNOSIS — I6389 Other cerebral infarction: Secondary | ICD-10-CM | POA: Diagnosis not present

## 2019-04-04 DIAGNOSIS — I129 Hypertensive chronic kidney disease with stage 1 through stage 4 chronic kidney disease, or unspecified chronic kidney disease: Secondary | ICD-10-CM | POA: Diagnosis not present

## 2019-04-04 DIAGNOSIS — E1122 Type 2 diabetes mellitus with diabetic chronic kidney disease: Secondary | ICD-10-CM | POA: Diagnosis not present

## 2019-04-04 DIAGNOSIS — E785 Hyperlipidemia, unspecified: Secondary | ICD-10-CM | POA: Diagnosis not present

## 2019-04-04 DIAGNOSIS — N182 Chronic kidney disease, stage 2 (mild): Secondary | ICD-10-CM | POA: Diagnosis not present

## 2019-04-04 DIAGNOSIS — D631 Anemia in chronic kidney disease: Secondary | ICD-10-CM | POA: Diagnosis not present

## 2019-04-05 DIAGNOSIS — I69398 Other sequelae of cerebral infarction: Secondary | ICD-10-CM | POA: Diagnosis not present

## 2019-04-05 DIAGNOSIS — R2689 Other abnormalities of gait and mobility: Secondary | ICD-10-CM | POA: Diagnosis not present

## 2019-04-05 DIAGNOSIS — D631 Anemia in chronic kidney disease: Secondary | ICD-10-CM | POA: Diagnosis not present

## 2019-04-05 DIAGNOSIS — R5381 Other malaise: Secondary | ICD-10-CM | POA: Diagnosis not present

## 2019-04-05 DIAGNOSIS — I480 Paroxysmal atrial fibrillation: Secondary | ICD-10-CM | POA: Diagnosis not present

## 2019-04-05 DIAGNOSIS — N182 Chronic kidney disease, stage 2 (mild): Secondary | ICD-10-CM | POA: Diagnosis not present

## 2019-04-05 DIAGNOSIS — E785 Hyperlipidemia, unspecified: Secondary | ICD-10-CM | POA: Diagnosis not present

## 2019-04-05 DIAGNOSIS — E1165 Type 2 diabetes mellitus with hyperglycemia: Secondary | ICD-10-CM | POA: Diagnosis not present

## 2019-04-05 DIAGNOSIS — I129 Hypertensive chronic kidney disease with stage 1 through stage 4 chronic kidney disease, or unspecified chronic kidney disease: Secondary | ICD-10-CM | POA: Diagnosis not present

## 2019-04-05 DIAGNOSIS — J189 Pneumonia, unspecified organism: Secondary | ICD-10-CM | POA: Diagnosis not present

## 2019-04-05 DIAGNOSIS — I69354 Hemiplegia and hemiparesis following cerebral infarction affecting left non-dominant side: Secondary | ICD-10-CM | POA: Diagnosis not present

## 2019-04-05 DIAGNOSIS — J9621 Acute and chronic respiratory failure with hypoxia: Secondary | ICD-10-CM | POA: Diagnosis not present

## 2019-04-05 DIAGNOSIS — R7989 Other specified abnormal findings of blood chemistry: Secondary | ICD-10-CM | POA: Diagnosis not present

## 2019-04-05 DIAGNOSIS — E1122 Type 2 diabetes mellitus with diabetic chronic kidney disease: Secondary | ICD-10-CM | POA: Diagnosis not present

## 2019-04-05 DIAGNOSIS — N179 Acute kidney failure, unspecified: Secondary | ICD-10-CM | POA: Diagnosis not present

## 2019-04-05 DIAGNOSIS — J690 Pneumonitis due to inhalation of food and vomit: Secondary | ICD-10-CM | POA: Diagnosis not present

## 2019-04-05 DIAGNOSIS — M6281 Muscle weakness (generalized): Secondary | ICD-10-CM | POA: Diagnosis not present

## 2019-04-06 DIAGNOSIS — E1165 Type 2 diabetes mellitus with hyperglycemia: Secondary | ICD-10-CM | POA: Diagnosis not present

## 2019-04-06 DIAGNOSIS — K2900 Acute gastritis without bleeding: Secondary | ICD-10-CM | POA: Diagnosis not present

## 2019-04-06 DIAGNOSIS — J9621 Acute and chronic respiratory failure with hypoxia: Secondary | ICD-10-CM

## 2019-04-06 DIAGNOSIS — I480 Paroxysmal atrial fibrillation: Secondary | ICD-10-CM

## 2019-04-06 DIAGNOSIS — J189 Pneumonia, unspecified organism: Secondary | ICD-10-CM

## 2019-04-06 DIAGNOSIS — J690 Pneumonitis due to inhalation of food and vomit: Secondary | ICD-10-CM

## 2019-04-06 DIAGNOSIS — R1312 Dysphagia, oropharyngeal phase: Secondary | ICD-10-CM | POA: Diagnosis not present

## 2019-04-06 DIAGNOSIS — I6389 Other cerebral infarction: Secondary | ICD-10-CM | POA: Diagnosis not present

## 2019-04-07 DIAGNOSIS — I6389 Other cerebral infarction: Secondary | ICD-10-CM | POA: Diagnosis not present

## 2019-04-07 DIAGNOSIS — I480 Paroxysmal atrial fibrillation: Secondary | ICD-10-CM

## 2019-04-07 DIAGNOSIS — E1165 Type 2 diabetes mellitus with hyperglycemia: Secondary | ICD-10-CM | POA: Diagnosis not present

## 2019-04-07 DIAGNOSIS — M6281 Muscle weakness (generalized): Secondary | ICD-10-CM | POA: Diagnosis not present

## 2019-04-07 DIAGNOSIS — I69398 Other sequelae of cerebral infarction: Secondary | ICD-10-CM | POA: Diagnosis not present

## 2019-04-07 DIAGNOSIS — R5381 Other malaise: Secondary | ICD-10-CM | POA: Diagnosis not present

## 2019-04-07 DIAGNOSIS — R2689 Other abnormalities of gait and mobility: Secondary | ICD-10-CM | POA: Diagnosis not present

## 2019-04-07 DIAGNOSIS — R1312 Dysphagia, oropharyngeal phase: Secondary | ICD-10-CM | POA: Diagnosis not present

## 2019-04-07 DIAGNOSIS — J189 Pneumonia, unspecified organism: Secondary | ICD-10-CM

## 2019-04-07 DIAGNOSIS — K2900 Acute gastritis without bleeding: Secondary | ICD-10-CM | POA: Diagnosis not present

## 2019-04-07 DIAGNOSIS — J690 Pneumonitis due to inhalation of food and vomit: Secondary | ICD-10-CM

## 2019-04-07 DIAGNOSIS — J9621 Acute and chronic respiratory failure with hypoxia: Secondary | ICD-10-CM

## 2019-04-08 DIAGNOSIS — J9620 Acute and chronic respiratory failure, unspecified whether with hypoxia or hypercapnia: Secondary | ICD-10-CM | POA: Diagnosis not present

## 2019-04-08 DIAGNOSIS — I639 Cerebral infarction, unspecified: Secondary | ICD-10-CM | POA: Diagnosis not present

## 2019-04-08 DIAGNOSIS — J9621 Acute and chronic respiratory failure with hypoxia: Secondary | ICD-10-CM

## 2019-04-08 DIAGNOSIS — I4891 Unspecified atrial fibrillation: Secondary | ICD-10-CM | POA: Diagnosis not present

## 2019-04-08 DIAGNOSIS — I480 Paroxysmal atrial fibrillation: Secondary | ICD-10-CM

## 2019-04-08 DIAGNOSIS — J189 Pneumonia, unspecified organism: Secondary | ICD-10-CM

## 2019-04-08 DIAGNOSIS — J690 Pneumonitis due to inhalation of food and vomit: Secondary | ICD-10-CM

## 2019-04-09 DIAGNOSIS — J189 Pneumonia, unspecified organism: Secondary | ICD-10-CM | POA: Diagnosis not present

## 2019-04-09 DIAGNOSIS — J9620 Acute and chronic respiratory failure, unspecified whether with hypoxia or hypercapnia: Secondary | ICD-10-CM | POA: Diagnosis not present

## 2019-04-09 DIAGNOSIS — I4891 Unspecified atrial fibrillation: Secondary | ICD-10-CM | POA: Diagnosis not present

## 2019-04-09 DIAGNOSIS — R7981 Abnormal blood-gas level: Secondary | ICD-10-CM | POA: Diagnosis not present

## 2019-04-09 DIAGNOSIS — J9621 Acute and chronic respiratory failure with hypoxia: Secondary | ICD-10-CM | POA: Diagnosis not present

## 2019-04-09 DIAGNOSIS — I639 Cerebral infarction, unspecified: Secondary | ICD-10-CM | POA: Diagnosis not present

## 2019-04-09 DIAGNOSIS — I480 Paroxysmal atrial fibrillation: Secondary | ICD-10-CM | POA: Diagnosis not present

## 2019-04-09 DIAGNOSIS — J690 Pneumonitis due to inhalation of food and vomit: Secondary | ICD-10-CM | POA: Diagnosis not present

## 2019-04-10 DIAGNOSIS — N182 Chronic kidney disease, stage 2 (mild): Secondary | ICD-10-CM | POA: Diagnosis not present

## 2019-04-10 DIAGNOSIS — E039 Hypothyroidism, unspecified: Secondary | ICD-10-CM | POA: Diagnosis not present

## 2019-04-10 DIAGNOSIS — F419 Anxiety disorder, unspecified: Secondary | ICD-10-CM | POA: Diagnosis not present

## 2019-04-10 DIAGNOSIS — J90 Pleural effusion, not elsewhere classified: Secondary | ICD-10-CM | POA: Diagnosis not present

## 2019-04-10 DIAGNOSIS — R1310 Dysphagia, unspecified: Secondary | ICD-10-CM | POA: Diagnosis not present

## 2019-04-10 DIAGNOSIS — I5043 Acute on chronic combined systolic (congestive) and diastolic (congestive) heart failure: Secondary | ICD-10-CM | POA: Diagnosis not present

## 2019-04-10 DIAGNOSIS — Z9989 Dependence on other enabling machines and devices: Secondary | ICD-10-CM | POA: Diagnosis not present

## 2019-04-10 DIAGNOSIS — J9601 Acute respiratory failure with hypoxia: Secondary | ICD-10-CM | POA: Diagnosis not present

## 2019-04-10 DIAGNOSIS — N179 Acute kidney failure, unspecified: Secondary | ICD-10-CM | POA: Diagnosis not present

## 2019-04-10 DIAGNOSIS — I480 Paroxysmal atrial fibrillation: Secondary | ICD-10-CM | POA: Diagnosis not present

## 2019-04-10 DIAGNOSIS — J9621 Acute and chronic respiratory failure with hypoxia: Secondary | ICD-10-CM | POA: Diagnosis not present

## 2019-04-10 DIAGNOSIS — J150 Pneumonia due to Klebsiella pneumoniae: Secondary | ICD-10-CM | POA: Diagnosis not present

## 2019-04-10 DIAGNOSIS — E1122 Type 2 diabetes mellitus with diabetic chronic kidney disease: Secondary | ICD-10-CM | POA: Diagnosis not present

## 2019-04-10 DIAGNOSIS — R69 Illness, unspecified: Secondary | ICD-10-CM | POA: Diagnosis not present

## 2019-04-10 DIAGNOSIS — I69354 Hemiplegia and hemiparesis following cerebral infarction affecting left non-dominant side: Secondary | ICD-10-CM | POA: Diagnosis not present

## 2019-04-10 DIAGNOSIS — J189 Pneumonia, unspecified organism: Secondary | ICD-10-CM | POA: Diagnosis not present

## 2019-04-10 DIAGNOSIS — J82 Pulmonary eosinophilia, not elsewhere classified: Secondary | ICD-10-CM | POA: Diagnosis not present

## 2019-04-10 DIAGNOSIS — D72829 Elevated white blood cell count, unspecified: Secondary | ICD-10-CM | POA: Diagnosis not present

## 2019-04-10 DIAGNOSIS — E1165 Type 2 diabetes mellitus with hyperglycemia: Secondary | ICD-10-CM | POA: Diagnosis not present

## 2019-04-10 DIAGNOSIS — N189 Chronic kidney disease, unspecified: Secondary | ICD-10-CM | POA: Diagnosis not present

## 2019-04-10 DIAGNOSIS — Z9911 Dependence on respirator [ventilator] status: Secondary | ICD-10-CM | POA: Diagnosis not present

## 2019-04-10 DIAGNOSIS — J690 Pneumonitis due to inhalation of food and vomit: Secondary | ICD-10-CM | POA: Diagnosis not present

## 2019-04-11 DIAGNOSIS — R2689 Other abnormalities of gait and mobility: Secondary | ICD-10-CM | POA: Diagnosis not present

## 2019-04-11 DIAGNOSIS — I129 Hypertensive chronic kidney disease with stage 1 through stage 4 chronic kidney disease, or unspecified chronic kidney disease: Secondary | ICD-10-CM | POA: Diagnosis not present

## 2019-04-11 DIAGNOSIS — E785 Hyperlipidemia, unspecified: Secondary | ICD-10-CM | POA: Diagnosis not present

## 2019-04-11 DIAGNOSIS — J159 Unspecified bacterial pneumonia: Secondary | ICD-10-CM | POA: Diagnosis not present

## 2019-04-11 DIAGNOSIS — I5043 Acute on chronic combined systolic (congestive) and diastolic (congestive) heart failure: Secondary | ICD-10-CM | POA: Diagnosis not present

## 2019-04-11 DIAGNOSIS — I69398 Other sequelae of cerebral infarction: Secondary | ICD-10-CM | POA: Diagnosis not present

## 2019-04-11 DIAGNOSIS — N182 Chronic kidney disease, stage 2 (mild): Secondary | ICD-10-CM | POA: Diagnosis not present

## 2019-04-11 DIAGNOSIS — M6281 Muscle weakness (generalized): Secondary | ICD-10-CM | POA: Diagnosis not present

## 2019-04-11 DIAGNOSIS — E1165 Type 2 diabetes mellitus with hyperglycemia: Secondary | ICD-10-CM | POA: Diagnosis not present

## 2019-04-11 DIAGNOSIS — R7989 Other specified abnormal findings of blood chemistry: Secondary | ICD-10-CM | POA: Diagnosis not present

## 2019-04-11 DIAGNOSIS — Z9989 Dependence on other enabling machines and devices: Secondary | ICD-10-CM | POA: Diagnosis not present

## 2019-04-11 DIAGNOSIS — Z9911 Dependence on respirator [ventilator] status: Secondary | ICD-10-CM | POA: Diagnosis not present

## 2019-04-11 DIAGNOSIS — J189 Pneumonia, unspecified organism: Secondary | ICD-10-CM | POA: Diagnosis not present

## 2019-04-11 DIAGNOSIS — J9621 Acute and chronic respiratory failure with hypoxia: Secondary | ICD-10-CM | POA: Diagnosis not present

## 2019-04-11 DIAGNOSIS — N179 Acute kidney failure, unspecified: Secondary | ICD-10-CM | POA: Diagnosis not present

## 2019-04-11 DIAGNOSIS — J90 Pleural effusion, not elsewhere classified: Secondary | ICD-10-CM | POA: Diagnosis not present

## 2019-04-11 DIAGNOSIS — J82 Pulmonary eosinophilia, not elsewhere classified: Secondary | ICD-10-CM | POA: Diagnosis not present

## 2019-04-11 DIAGNOSIS — D631 Anemia in chronic kidney disease: Secondary | ICD-10-CM | POA: Diagnosis not present

## 2019-04-11 DIAGNOSIS — E039 Hypothyroidism, unspecified: Secondary | ICD-10-CM | POA: Diagnosis not present

## 2019-04-11 DIAGNOSIS — R5381 Other malaise: Secondary | ICD-10-CM | POA: Diagnosis not present

## 2019-04-11 DIAGNOSIS — E1122 Type 2 diabetes mellitus with diabetic chronic kidney disease: Secondary | ICD-10-CM | POA: Diagnosis not present

## 2019-04-12 DIAGNOSIS — J90 Pleural effusion, not elsewhere classified: Secondary | ICD-10-CM | POA: Diagnosis not present

## 2019-04-12 DIAGNOSIS — N179 Acute kidney failure, unspecified: Secondary | ICD-10-CM | POA: Diagnosis not present

## 2019-04-12 DIAGNOSIS — J189 Pneumonia, unspecified organism: Secondary | ICD-10-CM | POA: Diagnosis not present

## 2019-04-12 DIAGNOSIS — E785 Hyperlipidemia, unspecified: Secondary | ICD-10-CM | POA: Diagnosis not present

## 2019-04-12 DIAGNOSIS — I4891 Unspecified atrial fibrillation: Secondary | ICD-10-CM | POA: Diagnosis not present

## 2019-04-12 DIAGNOSIS — I129 Hypertensive chronic kidney disease with stage 1 through stage 4 chronic kidney disease, or unspecified chronic kidney disease: Secondary | ICD-10-CM | POA: Diagnosis not present

## 2019-04-12 DIAGNOSIS — Z9911 Dependence on respirator [ventilator] status: Secondary | ICD-10-CM | POA: Diagnosis not present

## 2019-04-12 DIAGNOSIS — I69354 Hemiplegia and hemiparesis following cerebral infarction affecting left non-dominant side: Secondary | ICD-10-CM | POA: Diagnosis not present

## 2019-04-12 DIAGNOSIS — D631 Anemia in chronic kidney disease: Secondary | ICD-10-CM | POA: Diagnosis not present

## 2019-04-12 DIAGNOSIS — Z9989 Dependence on other enabling machines and devices: Secondary | ICD-10-CM | POA: Diagnosis not present

## 2019-04-12 DIAGNOSIS — N182 Chronic kidney disease, stage 2 (mild): Secondary | ICD-10-CM | POA: Diagnosis not present

## 2019-04-12 DIAGNOSIS — R7989 Other specified abnormal findings of blood chemistry: Secondary | ICD-10-CM | POA: Diagnosis not present

## 2019-04-12 DIAGNOSIS — J9621 Acute and chronic respiratory failure with hypoxia: Secondary | ICD-10-CM | POA: Diagnosis not present

## 2019-04-12 DIAGNOSIS — I480 Paroxysmal atrial fibrillation: Secondary | ICD-10-CM | POA: Diagnosis not present

## 2019-04-12 DIAGNOSIS — E1165 Type 2 diabetes mellitus with hyperglycemia: Secondary | ICD-10-CM | POA: Diagnosis not present

## 2019-04-12 DIAGNOSIS — E1122 Type 2 diabetes mellitus with diabetic chronic kidney disease: Secondary | ICD-10-CM | POA: Diagnosis not present

## 2019-04-13 DIAGNOSIS — J9621 Acute and chronic respiratory failure with hypoxia: Secondary | ICD-10-CM | POA: Diagnosis not present

## 2019-04-13 DIAGNOSIS — R5381 Other malaise: Secondary | ICD-10-CM | POA: Diagnosis not present

## 2019-04-13 DIAGNOSIS — J918 Pleural effusion in other conditions classified elsewhere: Secondary | ICD-10-CM | POA: Diagnosis not present

## 2019-04-13 DIAGNOSIS — Z9989 Dependence on other enabling machines and devices: Secondary | ICD-10-CM | POA: Diagnosis not present

## 2019-04-13 DIAGNOSIS — R2689 Other abnormalities of gait and mobility: Secondary | ICD-10-CM | POA: Diagnosis not present

## 2019-04-13 DIAGNOSIS — I69398 Other sequelae of cerebral infarction: Secondary | ICD-10-CM | POA: Diagnosis not present

## 2019-04-13 DIAGNOSIS — F419 Anxiety disorder, unspecified: Secondary | ICD-10-CM | POA: Diagnosis not present

## 2019-04-13 DIAGNOSIS — R69 Illness, unspecified: Secondary | ICD-10-CM | POA: Diagnosis not present

## 2019-04-13 DIAGNOSIS — R7989 Other specified abnormal findings of blood chemistry: Secondary | ICD-10-CM | POA: Diagnosis not present

## 2019-04-13 DIAGNOSIS — N182 Chronic kidney disease, stage 2 (mild): Secondary | ICD-10-CM | POA: Diagnosis not present

## 2019-04-13 DIAGNOSIS — E039 Hypothyroidism, unspecified: Secondary | ICD-10-CM | POA: Diagnosis not present

## 2019-04-13 DIAGNOSIS — J82 Pulmonary eosinophilia, not elsewhere classified: Secondary | ICD-10-CM | POA: Diagnosis not present

## 2019-04-13 DIAGNOSIS — J90 Pleural effusion, not elsewhere classified: Secondary | ICD-10-CM | POA: Diagnosis not present

## 2019-04-13 DIAGNOSIS — E1165 Type 2 diabetes mellitus with hyperglycemia: Secondary | ICD-10-CM | POA: Diagnosis not present

## 2019-04-13 DIAGNOSIS — R112 Nausea with vomiting, unspecified: Secondary | ICD-10-CM | POA: Diagnosis not present

## 2019-04-13 DIAGNOSIS — D631 Anemia in chronic kidney disease: Secondary | ICD-10-CM | POA: Diagnosis not present

## 2019-04-13 DIAGNOSIS — I129 Hypertensive chronic kidney disease with stage 1 through stage 4 chronic kidney disease, or unspecified chronic kidney disease: Secondary | ICD-10-CM | POA: Diagnosis not present

## 2019-04-13 DIAGNOSIS — J189 Pneumonia, unspecified organism: Secondary | ICD-10-CM | POA: Diagnosis not present

## 2019-04-13 DIAGNOSIS — R1311 Dysphagia, oral phase: Secondary | ICD-10-CM | POA: Diagnosis not present

## 2019-04-13 DIAGNOSIS — N179 Acute kidney failure, unspecified: Secondary | ICD-10-CM | POA: Diagnosis not present

## 2019-04-13 DIAGNOSIS — J159 Unspecified bacterial pneumonia: Secondary | ICD-10-CM | POA: Diagnosis not present

## 2019-04-13 DIAGNOSIS — K56609 Unspecified intestinal obstruction, unspecified as to partial versus complete obstruction: Secondary | ICD-10-CM | POA: Diagnosis not present

## 2019-04-13 DIAGNOSIS — Z9911 Dependence on respirator [ventilator] status: Secondary | ICD-10-CM | POA: Diagnosis not present

## 2019-04-13 DIAGNOSIS — E785 Hyperlipidemia, unspecified: Secondary | ICD-10-CM | POA: Diagnosis not present

## 2019-04-13 DIAGNOSIS — E1122 Type 2 diabetes mellitus with diabetic chronic kidney disease: Secondary | ICD-10-CM | POA: Diagnosis not present

## 2019-04-13 DIAGNOSIS — I6789 Other cerebrovascular disease: Secondary | ICD-10-CM | POA: Diagnosis not present

## 2019-04-13 DIAGNOSIS — M6281 Muscle weakness (generalized): Secondary | ICD-10-CM | POA: Diagnosis not present

## 2019-04-13 DIAGNOSIS — K2900 Acute gastritis without bleeding: Secondary | ICD-10-CM | POA: Diagnosis not present

## 2019-04-14 DIAGNOSIS — I5043 Acute on chronic combined systolic (congestive) and diastolic (congestive) heart failure: Secondary | ICD-10-CM | POA: Diagnosis not present

## 2019-04-14 DIAGNOSIS — N179 Acute kidney failure, unspecified: Secondary | ICD-10-CM | POA: Diagnosis not present

## 2019-04-14 DIAGNOSIS — J90 Pleural effusion, not elsewhere classified: Secondary | ICD-10-CM | POA: Diagnosis not present

## 2019-04-14 DIAGNOSIS — R7989 Other specified abnormal findings of blood chemistry: Secondary | ICD-10-CM | POA: Diagnosis not present

## 2019-04-14 DIAGNOSIS — Z9989 Dependence on other enabling machines and devices: Secondary | ICD-10-CM | POA: Diagnosis not present

## 2019-04-14 DIAGNOSIS — E039 Hypothyroidism, unspecified: Secondary | ICD-10-CM | POA: Diagnosis not present

## 2019-04-14 DIAGNOSIS — J189 Pneumonia, unspecified organism: Secondary | ICD-10-CM | POA: Diagnosis not present

## 2019-04-14 DIAGNOSIS — I129 Hypertensive chronic kidney disease with stage 1 through stage 4 chronic kidney disease, or unspecified chronic kidney disease: Secondary | ICD-10-CM | POA: Diagnosis not present

## 2019-04-14 DIAGNOSIS — E785 Hyperlipidemia, unspecified: Secondary | ICD-10-CM | POA: Diagnosis not present

## 2019-04-14 DIAGNOSIS — E1165 Type 2 diabetes mellitus with hyperglycemia: Secondary | ICD-10-CM | POA: Diagnosis not present

## 2019-04-14 DIAGNOSIS — D631 Anemia in chronic kidney disease: Secondary | ICD-10-CM | POA: Diagnosis not present

## 2019-04-14 DIAGNOSIS — K2900 Acute gastritis without bleeding: Secondary | ICD-10-CM | POA: Diagnosis not present

## 2019-04-14 DIAGNOSIS — Z9911 Dependence on respirator [ventilator] status: Secondary | ICD-10-CM | POA: Diagnosis not present

## 2019-04-14 DIAGNOSIS — J9621 Acute and chronic respiratory failure with hypoxia: Secondary | ICD-10-CM | POA: Diagnosis not present

## 2019-04-14 DIAGNOSIS — N182 Chronic kidney disease, stage 2 (mild): Secondary | ICD-10-CM | POA: Diagnosis not present

## 2019-04-14 DIAGNOSIS — I6389 Other cerebral infarction: Secondary | ICD-10-CM | POA: Diagnosis not present

## 2019-04-14 DIAGNOSIS — E1122 Type 2 diabetes mellitus with diabetic chronic kidney disease: Secondary | ICD-10-CM | POA: Diagnosis not present

## 2019-04-15 DIAGNOSIS — J90 Pleural effusion, not elsewhere classified: Secondary | ICD-10-CM | POA: Diagnosis not present

## 2019-04-15 DIAGNOSIS — J9621 Acute and chronic respiratory failure with hypoxia: Secondary | ICD-10-CM | POA: Diagnosis not present

## 2019-04-15 DIAGNOSIS — I5043 Acute on chronic combined systolic (congestive) and diastolic (congestive) heart failure: Secondary | ICD-10-CM | POA: Diagnosis not present

## 2019-04-15 DIAGNOSIS — R1312 Dysphagia, oropharyngeal phase: Secondary | ICD-10-CM | POA: Diagnosis not present

## 2019-04-15 DIAGNOSIS — J189 Pneumonia, unspecified organism: Secondary | ICD-10-CM | POA: Diagnosis not present

## 2019-04-15 DIAGNOSIS — I6389 Other cerebral infarction: Secondary | ICD-10-CM | POA: Diagnosis not present

## 2019-04-15 DIAGNOSIS — E039 Hypothyroidism, unspecified: Secondary | ICD-10-CM | POA: Diagnosis not present

## 2019-04-15 DIAGNOSIS — Z9911 Dependence on respirator [ventilator] status: Secondary | ICD-10-CM | POA: Diagnosis not present

## 2019-04-15 DIAGNOSIS — Z9989 Dependence on other enabling machines and devices: Secondary | ICD-10-CM | POA: Diagnosis not present

## 2019-04-15 DIAGNOSIS — R112 Nausea with vomiting, unspecified: Secondary | ICD-10-CM | POA: Diagnosis not present

## 2019-04-16 DIAGNOSIS — E039 Hypothyroidism, unspecified: Secondary | ICD-10-CM | POA: Diagnosis not present

## 2019-04-16 DIAGNOSIS — Z9911 Dependence on respirator [ventilator] status: Secondary | ICD-10-CM | POA: Diagnosis not present

## 2019-04-16 DIAGNOSIS — J9621 Acute and chronic respiratory failure with hypoxia: Secondary | ICD-10-CM | POA: Diagnosis not present

## 2019-04-16 DIAGNOSIS — Z9989 Dependence on other enabling machines and devices: Secondary | ICD-10-CM | POA: Diagnosis not present

## 2019-04-16 DIAGNOSIS — R1312 Dysphagia, oropharyngeal phase: Secondary | ICD-10-CM | POA: Diagnosis not present

## 2019-04-16 DIAGNOSIS — I6389 Other cerebral infarction: Secondary | ICD-10-CM | POA: Diagnosis not present

## 2019-04-16 DIAGNOSIS — K2901 Acute gastritis with bleeding: Secondary | ICD-10-CM | POA: Diagnosis not present

## 2019-04-16 DIAGNOSIS — J90 Pleural effusion, not elsewhere classified: Secondary | ICD-10-CM | POA: Diagnosis not present

## 2019-04-16 DIAGNOSIS — J189 Pneumonia, unspecified organism: Secondary | ICD-10-CM | POA: Diagnosis not present

## 2019-04-17 DIAGNOSIS — R112 Nausea with vomiting, unspecified: Secondary | ICD-10-CM | POA: Diagnosis not present

## 2019-04-17 DIAGNOSIS — J189 Pneumonia, unspecified organism: Secondary | ICD-10-CM

## 2019-04-17 DIAGNOSIS — E1165 Type 2 diabetes mellitus with hyperglycemia: Secondary | ICD-10-CM | POA: Diagnosis not present

## 2019-04-17 DIAGNOSIS — N182 Chronic kidney disease, stage 2 (mild): Secondary | ICD-10-CM | POA: Diagnosis not present

## 2019-04-17 DIAGNOSIS — R1310 Dysphagia, unspecified: Secondary | ICD-10-CM | POA: Diagnosis not present

## 2019-04-17 DIAGNOSIS — J918 Pleural effusion in other conditions classified elsewhere: Secondary | ICD-10-CM | POA: Diagnosis not present

## 2019-04-17 DIAGNOSIS — E039 Hypothyroidism, unspecified: Secondary | ICD-10-CM | POA: Diagnosis not present

## 2019-04-17 DIAGNOSIS — R1311 Dysphagia, oral phase: Secondary | ICD-10-CM | POA: Diagnosis not present

## 2019-04-17 DIAGNOSIS — N179 Acute kidney failure, unspecified: Secondary | ICD-10-CM | POA: Diagnosis not present

## 2019-04-17 DIAGNOSIS — Z9911 Dependence on respirator [ventilator] status: Secondary | ICD-10-CM | POA: Diagnosis not present

## 2019-04-17 DIAGNOSIS — J158 Pneumonia due to other specified bacteria: Secondary | ICD-10-CM | POA: Diagnosis not present

## 2019-04-17 DIAGNOSIS — R69 Illness, unspecified: Secondary | ICD-10-CM | POA: Diagnosis not present

## 2019-04-17 DIAGNOSIS — I6789 Other cerebrovascular disease: Secondary | ICD-10-CM | POA: Diagnosis not present

## 2019-04-17 DIAGNOSIS — E1122 Type 2 diabetes mellitus with diabetic chronic kidney disease: Secondary | ICD-10-CM | POA: Diagnosis not present

## 2019-04-17 DIAGNOSIS — J690 Pneumonitis due to inhalation of food and vomit: Secondary | ICD-10-CM

## 2019-04-17 DIAGNOSIS — J9621 Acute and chronic respiratory failure with hypoxia: Secondary | ICD-10-CM

## 2019-04-17 DIAGNOSIS — F419 Anxiety disorder, unspecified: Secondary | ICD-10-CM | POA: Diagnosis not present

## 2019-04-17 DIAGNOSIS — I69354 Hemiplegia and hemiparesis following cerebral infarction affecting left non-dominant side: Secondary | ICD-10-CM | POA: Diagnosis not present

## 2019-04-17 DIAGNOSIS — D72829 Elevated white blood cell count, unspecified: Secondary | ICD-10-CM | POA: Diagnosis not present

## 2019-04-17 DIAGNOSIS — I480 Paroxysmal atrial fibrillation: Secondary | ICD-10-CM

## 2019-04-17 DIAGNOSIS — N189 Chronic kidney disease, unspecified: Secondary | ICD-10-CM | POA: Diagnosis not present

## 2019-04-17 DIAGNOSIS — K2901 Acute gastritis with bleeding: Secondary | ICD-10-CM | POA: Diagnosis not present

## 2019-04-17 DIAGNOSIS — I5043 Acute on chronic combined systolic (congestive) and diastolic (congestive) heart failure: Secondary | ICD-10-CM | POA: Diagnosis not present

## 2019-04-18 DIAGNOSIS — J910 Malignant pleural effusion: Secondary | ICD-10-CM | POA: Diagnosis not present

## 2019-04-18 DIAGNOSIS — I6789 Other cerebrovascular disease: Secondary | ICD-10-CM | POA: Diagnosis not present

## 2019-04-18 DIAGNOSIS — I480 Paroxysmal atrial fibrillation: Secondary | ICD-10-CM

## 2019-04-18 DIAGNOSIS — R1311 Dysphagia, oral phase: Secondary | ICD-10-CM | POA: Diagnosis not present

## 2019-04-18 DIAGNOSIS — Z09 Encounter for follow-up examination after completed treatment for conditions other than malignant neoplasm: Secondary | ICD-10-CM | POA: Diagnosis not present

## 2019-04-18 DIAGNOSIS — I10 Essential (primary) hypertension: Secondary | ICD-10-CM | POA: Diagnosis not present

## 2019-04-18 DIAGNOSIS — E119 Type 2 diabetes mellitus without complications: Secondary | ICD-10-CM | POA: Diagnosis not present

## 2019-04-18 DIAGNOSIS — J9621 Acute and chronic respiratory failure with hypoxia: Secondary | ICD-10-CM

## 2019-04-18 DIAGNOSIS — N189 Chronic kidney disease, unspecified: Secondary | ICD-10-CM | POA: Diagnosis not present

## 2019-04-18 DIAGNOSIS — J918 Pleural effusion in other conditions classified elsewhere: Secondary | ICD-10-CM | POA: Diagnosis not present

## 2019-04-18 DIAGNOSIS — R0682 Tachypnea, not elsewhere classified: Secondary | ICD-10-CM | POA: Diagnosis not present

## 2019-04-18 DIAGNOSIS — J690 Pneumonitis due to inhalation of food and vomit: Secondary | ICD-10-CM

## 2019-04-18 DIAGNOSIS — J189 Pneumonia, unspecified organism: Secondary | ICD-10-CM

## 2019-04-18 DIAGNOSIS — Z9911 Dependence on respirator [ventilator] status: Secondary | ICD-10-CM | POA: Diagnosis not present

## 2019-04-18 DIAGNOSIS — I6389 Other cerebral infarction: Secondary | ICD-10-CM | POA: Diagnosis not present

## 2019-04-18 DIAGNOSIS — J159 Unspecified bacterial pneumonia: Secondary | ICD-10-CM | POA: Diagnosis not present

## 2019-04-18 DIAGNOSIS — I5043 Acute on chronic combined systolic (congestive) and diastolic (congestive) heart failure: Secondary | ICD-10-CM | POA: Diagnosis not present

## 2019-04-18 DIAGNOSIS — E039 Hypothyroidism, unspecified: Secondary | ICD-10-CM | POA: Diagnosis not present

## 2019-04-18 DIAGNOSIS — J9690 Respiratory failure, unspecified, unspecified whether with hypoxia or hypercapnia: Secondary | ICD-10-CM | POA: Diagnosis not present

## 2019-04-19 DIAGNOSIS — I69354 Hemiplegia and hemiparesis following cerebral infarction affecting left non-dominant side: Secondary | ICD-10-CM | POA: Diagnosis not present

## 2019-04-19 DIAGNOSIS — J9621 Acute and chronic respiratory failure with hypoxia: Secondary | ICD-10-CM

## 2019-04-19 DIAGNOSIS — M6281 Muscle weakness (generalized): Secondary | ICD-10-CM | POA: Diagnosis not present

## 2019-04-19 DIAGNOSIS — N182 Chronic kidney disease, stage 2 (mild): Secondary | ICD-10-CM | POA: Diagnosis not present

## 2019-04-19 DIAGNOSIS — R2689 Other abnormalities of gait and mobility: Secondary | ICD-10-CM | POA: Diagnosis not present

## 2019-04-19 DIAGNOSIS — I480 Paroxysmal atrial fibrillation: Secondary | ICD-10-CM

## 2019-04-19 DIAGNOSIS — I69398 Other sequelae of cerebral infarction: Secondary | ICD-10-CM | POA: Diagnosis not present

## 2019-04-19 DIAGNOSIS — R5381 Other malaise: Secondary | ICD-10-CM | POA: Diagnosis not present

## 2019-04-19 DIAGNOSIS — J189 Pneumonia, unspecified organism: Secondary | ICD-10-CM

## 2019-04-19 DIAGNOSIS — J690 Pneumonitis due to inhalation of food and vomit: Secondary | ICD-10-CM

## 2019-04-19 DIAGNOSIS — E1122 Type 2 diabetes mellitus with diabetic chronic kidney disease: Secondary | ICD-10-CM | POA: Diagnosis not present

## 2019-04-20 ENCOUNTER — Ambulatory Visit: Payer: No Typology Code available for payment source | Admitting: General Surgery

## 2019-04-20 DIAGNOSIS — I5043 Acute on chronic combined systolic (congestive) and diastolic (congestive) heart failure: Secondary | ICD-10-CM | POA: Diagnosis not present

## 2019-04-20 DIAGNOSIS — J189 Pneumonia, unspecified organism: Secondary | ICD-10-CM | POA: Diagnosis not present

## 2019-04-20 DIAGNOSIS — Z9911 Dependence on respirator [ventilator] status: Secondary | ICD-10-CM | POA: Diagnosis not present

## 2019-04-20 DIAGNOSIS — I6389 Other cerebral infarction: Secondary | ICD-10-CM | POA: Diagnosis not present

## 2019-04-20 DIAGNOSIS — R69 Illness, unspecified: Secondary | ICD-10-CM | POA: Diagnosis not present

## 2019-04-20 DIAGNOSIS — I480 Paroxysmal atrial fibrillation: Secondary | ICD-10-CM | POA: Diagnosis not present

## 2019-04-20 DIAGNOSIS — J690 Pneumonitis due to inhalation of food and vomit: Secondary | ICD-10-CM | POA: Diagnosis not present

## 2019-04-20 DIAGNOSIS — J9621 Acute and chronic respiratory failure with hypoxia: Secondary | ICD-10-CM | POA: Diagnosis not present

## 2019-04-20 DIAGNOSIS — R1311 Dysphagia, oral phase: Secondary | ICD-10-CM | POA: Diagnosis not present

## 2019-04-20 DIAGNOSIS — K2901 Acute gastritis with bleeding: Secondary | ICD-10-CM | POA: Diagnosis not present

## 2019-04-20 DIAGNOSIS — J918 Pleural effusion in other conditions classified elsewhere: Secondary | ICD-10-CM | POA: Diagnosis not present

## 2019-04-20 DIAGNOSIS — I6789 Other cerebrovascular disease: Secondary | ICD-10-CM | POA: Diagnosis not present

## 2019-04-20 DIAGNOSIS — F419 Anxiety disorder, unspecified: Secondary | ICD-10-CM | POA: Diagnosis not present

## 2019-04-21 DIAGNOSIS — J690 Pneumonitis due to inhalation of food and vomit: Secondary | ICD-10-CM | POA: Diagnosis not present

## 2019-04-21 DIAGNOSIS — I480 Paroxysmal atrial fibrillation: Secondary | ICD-10-CM | POA: Diagnosis not present

## 2019-04-21 DIAGNOSIS — E1122 Type 2 diabetes mellitus with diabetic chronic kidney disease: Secondary | ICD-10-CM | POA: Diagnosis not present

## 2019-04-21 DIAGNOSIS — N182 Chronic kidney disease, stage 2 (mild): Secondary | ICD-10-CM | POA: Diagnosis not present

## 2019-04-21 DIAGNOSIS — J189 Pneumonia, unspecified organism: Secondary | ICD-10-CM | POA: Diagnosis not present

## 2019-04-21 DIAGNOSIS — I69354 Hemiplegia and hemiparesis following cerebral infarction affecting left non-dominant side: Secondary | ICD-10-CM | POA: Diagnosis not present

## 2019-04-21 DIAGNOSIS — I4891 Unspecified atrial fibrillation: Secondary | ICD-10-CM | POA: Diagnosis not present

## 2019-04-21 DIAGNOSIS — J9621 Acute and chronic respiratory failure with hypoxia: Secondary | ICD-10-CM | POA: Diagnosis not present

## 2019-04-22 DIAGNOSIS — I480 Paroxysmal atrial fibrillation: Secondary | ICD-10-CM | POA: Diagnosis not present

## 2019-04-22 DIAGNOSIS — J9621 Acute and chronic respiratory failure with hypoxia: Secondary | ICD-10-CM | POA: Diagnosis not present

## 2019-04-22 DIAGNOSIS — J189 Pneumonia, unspecified organism: Secondary | ICD-10-CM | POA: Diagnosis not present

## 2019-04-22 DIAGNOSIS — I639 Cerebral infarction, unspecified: Secondary | ICD-10-CM | POA: Diagnosis not present

## 2019-04-22 DIAGNOSIS — J9691 Respiratory failure, unspecified with hypoxia: Secondary | ICD-10-CM | POA: Diagnosis not present

## 2019-04-22 DIAGNOSIS — E119 Type 2 diabetes mellitus without complications: Secondary | ICD-10-CM | POA: Diagnosis not present

## 2019-04-22 DIAGNOSIS — J690 Pneumonitis due to inhalation of food and vomit: Secondary | ICD-10-CM | POA: Diagnosis not present

## 2019-04-23 DIAGNOSIS — J9691 Respiratory failure, unspecified with hypoxia: Secondary | ICD-10-CM | POA: Diagnosis not present

## 2019-04-23 DIAGNOSIS — J9621 Acute and chronic respiratory failure with hypoxia: Secondary | ICD-10-CM | POA: Diagnosis not present

## 2019-04-23 DIAGNOSIS — J189 Pneumonia, unspecified organism: Secondary | ICD-10-CM | POA: Diagnosis not present

## 2019-04-23 DIAGNOSIS — I639 Cerebral infarction, unspecified: Secondary | ICD-10-CM | POA: Diagnosis not present

## 2019-04-23 DIAGNOSIS — J690 Pneumonitis due to inhalation of food and vomit: Secondary | ICD-10-CM | POA: Diagnosis not present

## 2019-04-23 DIAGNOSIS — I480 Paroxysmal atrial fibrillation: Secondary | ICD-10-CM | POA: Diagnosis not present

## 2019-04-23 DIAGNOSIS — E119 Type 2 diabetes mellitus without complications: Secondary | ICD-10-CM | POA: Diagnosis not present

## 2019-04-24 DIAGNOSIS — J9621 Acute and chronic respiratory failure with hypoxia: Secondary | ICD-10-CM | POA: Diagnosis not present

## 2019-04-24 DIAGNOSIS — N182 Chronic kidney disease, stage 2 (mild): Secondary | ICD-10-CM | POA: Diagnosis not present

## 2019-04-24 DIAGNOSIS — Z9989 Dependence on other enabling machines and devices: Secondary | ICD-10-CM | POA: Diagnosis not present

## 2019-04-24 DIAGNOSIS — I480 Paroxysmal atrial fibrillation: Secondary | ICD-10-CM | POA: Diagnosis not present

## 2019-04-24 DIAGNOSIS — E1122 Type 2 diabetes mellitus with diabetic chronic kidney disease: Secondary | ICD-10-CM | POA: Diagnosis not present

## 2019-04-24 DIAGNOSIS — I69354 Hemiplegia and hemiparesis following cerebral infarction affecting left non-dominant side: Secondary | ICD-10-CM | POA: Diagnosis not present

## 2019-04-24 DIAGNOSIS — J90 Pleural effusion, not elsewhere classified: Secondary | ICD-10-CM | POA: Diagnosis not present

## 2019-04-25 DIAGNOSIS — E039 Hypothyroidism, unspecified: Secondary | ICD-10-CM | POA: Diagnosis not present

## 2019-04-25 DIAGNOSIS — J189 Pneumonia, unspecified organism: Secondary | ICD-10-CM | POA: Diagnosis not present

## 2019-04-25 DIAGNOSIS — J90 Pleural effusion, not elsewhere classified: Secondary | ICD-10-CM | POA: Diagnosis not present

## 2019-04-25 DIAGNOSIS — I5043 Acute on chronic combined systolic (congestive) and diastolic (congestive) heart failure: Secondary | ICD-10-CM | POA: Diagnosis not present

## 2019-04-25 DIAGNOSIS — Z9989 Dependence on other enabling machines and devices: Secondary | ICD-10-CM | POA: Diagnosis not present

## 2019-04-25 DIAGNOSIS — I6389 Other cerebral infarction: Secondary | ICD-10-CM | POA: Diagnosis not present

## 2019-04-25 DIAGNOSIS — J9621 Acute and chronic respiratory failure with hypoxia: Secondary | ICD-10-CM | POA: Diagnosis not present

## 2019-04-26 DIAGNOSIS — J9621 Acute and chronic respiratory failure with hypoxia: Secondary | ICD-10-CM | POA: Diagnosis not present

## 2019-04-26 DIAGNOSIS — J90 Pleural effusion, not elsewhere classified: Secondary | ICD-10-CM | POA: Diagnosis not present

## 2019-04-26 DIAGNOSIS — M6281 Muscle weakness (generalized): Secondary | ICD-10-CM | POA: Diagnosis not present

## 2019-04-26 DIAGNOSIS — R2689 Other abnormalities of gait and mobility: Secondary | ICD-10-CM | POA: Diagnosis not present

## 2019-04-26 DIAGNOSIS — I480 Paroxysmal atrial fibrillation: Secondary | ICD-10-CM | POA: Diagnosis not present

## 2019-04-26 DIAGNOSIS — I69354 Hemiplegia and hemiparesis following cerebral infarction affecting left non-dominant side: Secondary | ICD-10-CM | POA: Diagnosis not present

## 2019-04-26 DIAGNOSIS — E1122 Type 2 diabetes mellitus with diabetic chronic kidney disease: Secondary | ICD-10-CM | POA: Diagnosis not present

## 2019-04-26 DIAGNOSIS — Z9989 Dependence on other enabling machines and devices: Secondary | ICD-10-CM | POA: Diagnosis not present

## 2019-04-26 DIAGNOSIS — R5381 Other malaise: Secondary | ICD-10-CM | POA: Diagnosis not present

## 2019-04-26 DIAGNOSIS — N182 Chronic kidney disease, stage 2 (mild): Secondary | ICD-10-CM | POA: Diagnosis not present

## 2019-04-26 DIAGNOSIS — I69398 Other sequelae of cerebral infarction: Secondary | ICD-10-CM | POA: Diagnosis not present

## 2019-04-27 DIAGNOSIS — J90 Pleural effusion, not elsewhere classified: Secondary | ICD-10-CM | POA: Diagnosis not present

## 2019-04-27 DIAGNOSIS — J9621 Acute and chronic respiratory failure with hypoxia: Secondary | ICD-10-CM | POA: Diagnosis not present

## 2019-04-27 DIAGNOSIS — Z9989 Dependence on other enabling machines and devices: Secondary | ICD-10-CM | POA: Diagnosis not present

## 2019-04-28 DIAGNOSIS — Z9989 Dependence on other enabling machines and devices: Secondary | ICD-10-CM | POA: Diagnosis not present

## 2019-04-28 DIAGNOSIS — R5381 Other malaise: Secondary | ICD-10-CM | POA: Diagnosis not present

## 2019-04-28 DIAGNOSIS — I480 Paroxysmal atrial fibrillation: Secondary | ICD-10-CM | POA: Diagnosis not present

## 2019-04-28 DIAGNOSIS — I69354 Hemiplegia and hemiparesis following cerebral infarction affecting left non-dominant side: Secondary | ICD-10-CM | POA: Diagnosis not present

## 2019-04-28 DIAGNOSIS — N182 Chronic kidney disease, stage 2 (mild): Secondary | ICD-10-CM | POA: Diagnosis not present

## 2019-04-28 DIAGNOSIS — E1122 Type 2 diabetes mellitus with diabetic chronic kidney disease: Secondary | ICD-10-CM | POA: Diagnosis not present

## 2019-04-28 DIAGNOSIS — I69398 Other sequelae of cerebral infarction: Secondary | ICD-10-CM | POA: Diagnosis not present

## 2019-04-28 DIAGNOSIS — J9621 Acute and chronic respiratory failure with hypoxia: Secondary | ICD-10-CM | POA: Diagnosis not present

## 2019-04-28 DIAGNOSIS — M6281 Muscle weakness (generalized): Secondary | ICD-10-CM | POA: Diagnosis not present

## 2019-04-28 DIAGNOSIS — R2689 Other abnormalities of gait and mobility: Secondary | ICD-10-CM | POA: Diagnosis not present

## 2019-04-28 DIAGNOSIS — J90 Pleural effusion, not elsewhere classified: Secondary | ICD-10-CM | POA: Diagnosis not present

## 2019-04-29 DIAGNOSIS — I6389 Other cerebral infarction: Secondary | ICD-10-CM | POA: Diagnosis not present

## 2019-04-29 DIAGNOSIS — J189 Pneumonia, unspecified organism: Secondary | ICD-10-CM | POA: Diagnosis not present

## 2019-04-29 DIAGNOSIS — J9621 Acute and chronic respiratory failure with hypoxia: Secondary | ICD-10-CM | POA: Diagnosis not present

## 2019-04-29 DIAGNOSIS — I5043 Acute on chronic combined systolic (congestive) and diastolic (congestive) heart failure: Secondary | ICD-10-CM | POA: Diagnosis not present

## 2019-04-29 DIAGNOSIS — Z9989 Dependence on other enabling machines and devices: Secondary | ICD-10-CM | POA: Diagnosis not present

## 2019-04-29 DIAGNOSIS — E039 Hypothyroidism, unspecified: Secondary | ICD-10-CM | POA: Diagnosis not present

## 2019-04-29 DIAGNOSIS — J90 Pleural effusion, not elsewhere classified: Secondary | ICD-10-CM | POA: Diagnosis not present

## 2019-04-30 DIAGNOSIS — I6389 Other cerebral infarction: Secondary | ICD-10-CM | POA: Diagnosis not present

## 2019-04-30 DIAGNOSIS — R1312 Dysphagia, oropharyngeal phase: Secondary | ICD-10-CM | POA: Diagnosis not present

## 2019-04-30 DIAGNOSIS — Z9989 Dependence on other enabling machines and devices: Secondary | ICD-10-CM | POA: Diagnosis not present

## 2019-04-30 DIAGNOSIS — J9621 Acute and chronic respiratory failure with hypoxia: Secondary | ICD-10-CM | POA: Diagnosis not present

## 2019-04-30 DIAGNOSIS — I5043 Acute on chronic combined systolic (congestive) and diastolic (congestive) heart failure: Secondary | ICD-10-CM | POA: Diagnosis not present

## 2019-04-30 DIAGNOSIS — J90 Pleural effusion, not elsewhere classified: Secondary | ICD-10-CM | POA: Diagnosis not present

## 2019-04-30 DIAGNOSIS — J189 Pneumonia, unspecified organism: Secondary | ICD-10-CM | POA: Diagnosis not present

## 2019-05-01 DIAGNOSIS — J690 Pneumonitis due to inhalation of food and vomit: Secondary | ICD-10-CM | POA: Diagnosis not present

## 2019-05-01 DIAGNOSIS — E1122 Type 2 diabetes mellitus with diabetic chronic kidney disease: Secondary | ICD-10-CM | POA: Diagnosis not present

## 2019-05-01 DIAGNOSIS — I480 Paroxysmal atrial fibrillation: Secondary | ICD-10-CM | POA: Diagnosis not present

## 2019-05-01 DIAGNOSIS — R2689 Other abnormalities of gait and mobility: Secondary | ICD-10-CM | POA: Diagnosis not present

## 2019-05-01 DIAGNOSIS — J189 Pneumonia, unspecified organism: Secondary | ICD-10-CM | POA: Diagnosis not present

## 2019-05-01 DIAGNOSIS — I5043 Acute on chronic combined systolic (congestive) and diastolic (congestive) heart failure: Secondary | ICD-10-CM | POA: Diagnosis not present

## 2019-05-01 DIAGNOSIS — R5381 Other malaise: Secondary | ICD-10-CM | POA: Diagnosis not present

## 2019-05-01 DIAGNOSIS — I69354 Hemiplegia and hemiparesis following cerebral infarction affecting left non-dominant side: Secondary | ICD-10-CM | POA: Diagnosis not present

## 2019-05-01 DIAGNOSIS — F419 Anxiety disorder, unspecified: Secondary | ICD-10-CM | POA: Diagnosis not present

## 2019-05-01 DIAGNOSIS — M6281 Muscle weakness (generalized): Secondary | ICD-10-CM | POA: Diagnosis not present

## 2019-05-01 DIAGNOSIS — R69 Illness, unspecified: Secondary | ICD-10-CM | POA: Diagnosis not present

## 2019-05-01 DIAGNOSIS — I69398 Other sequelae of cerebral infarction: Secondary | ICD-10-CM | POA: Diagnosis not present

## 2019-05-01 DIAGNOSIS — E039 Hypothyroidism, unspecified: Secondary | ICD-10-CM | POA: Diagnosis not present

## 2019-05-01 DIAGNOSIS — I6389 Other cerebral infarction: Secondary | ICD-10-CM | POA: Diagnosis not present

## 2019-05-01 DIAGNOSIS — J90 Pleural effusion, not elsewhere classified: Secondary | ICD-10-CM | POA: Diagnosis not present

## 2019-05-01 DIAGNOSIS — N182 Chronic kidney disease, stage 2 (mild): Secondary | ICD-10-CM | POA: Diagnosis not present

## 2019-05-01 DIAGNOSIS — J9621 Acute and chronic respiratory failure with hypoxia: Secondary | ICD-10-CM | POA: Diagnosis not present

## 2019-05-01 DIAGNOSIS — R918 Other nonspecific abnormal finding of lung field: Secondary | ICD-10-CM | POA: Diagnosis not present

## 2019-05-02 DIAGNOSIS — I480 Paroxysmal atrial fibrillation: Secondary | ICD-10-CM | POA: Diagnosis not present

## 2019-05-02 DIAGNOSIS — J9621 Acute and chronic respiratory failure with hypoxia: Secondary | ICD-10-CM | POA: Diagnosis not present

## 2019-05-02 DIAGNOSIS — E1165 Type 2 diabetes mellitus with hyperglycemia: Secondary | ICD-10-CM | POA: Diagnosis not present

## 2019-05-02 DIAGNOSIS — I5043 Acute on chronic combined systolic (congestive) and diastolic (congestive) heart failure: Secondary | ICD-10-CM | POA: Diagnosis not present

## 2019-05-02 DIAGNOSIS — J690 Pneumonitis due to inhalation of food and vomit: Secondary | ICD-10-CM | POA: Diagnosis not present

## 2019-05-02 DIAGNOSIS — I6389 Other cerebral infarction: Secondary | ICD-10-CM | POA: Diagnosis not present

## 2019-05-02 DIAGNOSIS — E039 Hypothyroidism, unspecified: Secondary | ICD-10-CM | POA: Diagnosis not present

## 2019-05-02 DIAGNOSIS — J189 Pneumonia, unspecified organism: Secondary | ICD-10-CM | POA: Diagnosis not present

## 2019-05-03 DIAGNOSIS — I5043 Acute on chronic combined systolic (congestive) and diastolic (congestive) heart failure: Secondary | ICD-10-CM | POA: Diagnosis not present

## 2019-05-03 DIAGNOSIS — N182 Chronic kidney disease, stage 2 (mild): Secondary | ICD-10-CM | POA: Diagnosis not present

## 2019-05-03 DIAGNOSIS — L89313 Pressure ulcer of right buttock, stage 3: Secondary | ICD-10-CM | POA: Diagnosis not present

## 2019-05-03 DIAGNOSIS — J690 Pneumonitis due to inhalation of food and vomit: Secondary | ICD-10-CM | POA: Diagnosis not present

## 2019-05-03 DIAGNOSIS — K2900 Acute gastritis without bleeding: Secondary | ICD-10-CM | POA: Diagnosis not present

## 2019-05-03 DIAGNOSIS — E039 Hypothyroidism, unspecified: Secondary | ICD-10-CM | POA: Diagnosis not present

## 2019-05-03 DIAGNOSIS — J189 Pneumonia, unspecified organism: Secondary | ICD-10-CM | POA: Diagnosis not present

## 2019-05-03 DIAGNOSIS — I480 Paroxysmal atrial fibrillation: Secondary | ICD-10-CM | POA: Diagnosis not present

## 2019-05-03 DIAGNOSIS — I69354 Hemiplegia and hemiparesis following cerebral infarction affecting left non-dominant side: Secondary | ICD-10-CM | POA: Diagnosis not present

## 2019-05-03 DIAGNOSIS — J9621 Acute and chronic respiratory failure with hypoxia: Secondary | ICD-10-CM | POA: Diagnosis not present

## 2019-05-03 DIAGNOSIS — E1122 Type 2 diabetes mellitus with diabetic chronic kidney disease: Secondary | ICD-10-CM | POA: Diagnosis not present

## 2019-05-04 DIAGNOSIS — J9621 Acute and chronic respiratory failure with hypoxia: Secondary | ICD-10-CM | POA: Diagnosis not present

## 2019-05-04 DIAGNOSIS — I5043 Acute on chronic combined systolic (congestive) and diastolic (congestive) heart failure: Secondary | ICD-10-CM | POA: Diagnosis not present

## 2019-05-04 DIAGNOSIS — I6389 Other cerebral infarction: Secondary | ICD-10-CM | POA: Diagnosis not present

## 2019-05-04 DIAGNOSIS — J189 Pneumonia, unspecified organism: Secondary | ICD-10-CM | POA: Diagnosis not present

## 2019-05-04 DIAGNOSIS — R5381 Other malaise: Secondary | ICD-10-CM | POA: Diagnosis not present

## 2019-05-04 DIAGNOSIS — I69398 Other sequelae of cerebral infarction: Secondary | ICD-10-CM | POA: Diagnosis not present

## 2019-05-04 DIAGNOSIS — R2689 Other abnormalities of gait and mobility: Secondary | ICD-10-CM | POA: Diagnosis not present

## 2019-05-04 DIAGNOSIS — J690 Pneumonitis due to inhalation of food and vomit: Secondary | ICD-10-CM | POA: Diagnosis not present

## 2019-05-04 DIAGNOSIS — E039 Hypothyroidism, unspecified: Secondary | ICD-10-CM | POA: Diagnosis not present

## 2019-05-04 DIAGNOSIS — I480 Paroxysmal atrial fibrillation: Secondary | ICD-10-CM | POA: Diagnosis not present

## 2019-05-04 DIAGNOSIS — M6281 Muscle weakness (generalized): Secondary | ICD-10-CM | POA: Diagnosis not present

## 2019-05-04 DIAGNOSIS — R1312 Dysphagia, oropharyngeal phase: Secondary | ICD-10-CM | POA: Diagnosis not present

## 2019-05-05 DIAGNOSIS — I69354 Hemiplegia and hemiparesis following cerebral infarction affecting left non-dominant side: Secondary | ICD-10-CM | POA: Diagnosis not present

## 2019-05-05 DIAGNOSIS — J189 Pneumonia, unspecified organism: Secondary | ICD-10-CM | POA: Diagnosis not present

## 2019-05-05 DIAGNOSIS — E1122 Type 2 diabetes mellitus with diabetic chronic kidney disease: Secondary | ICD-10-CM | POA: Diagnosis not present

## 2019-05-05 DIAGNOSIS — J9621 Acute and chronic respiratory failure with hypoxia: Secondary | ICD-10-CM | POA: Diagnosis not present

## 2019-05-05 DIAGNOSIS — J398 Other specified diseases of upper respiratory tract: Secondary | ICD-10-CM | POA: Diagnosis not present

## 2019-05-05 DIAGNOSIS — N182 Chronic kidney disease, stage 2 (mild): Secondary | ICD-10-CM | POA: Diagnosis not present

## 2019-05-05 DIAGNOSIS — R042 Hemoptysis: Secondary | ICD-10-CM | POA: Diagnosis not present

## 2019-05-05 DIAGNOSIS — J690 Pneumonitis due to inhalation of food and vomit: Secondary | ICD-10-CM | POA: Diagnosis not present

## 2019-05-05 DIAGNOSIS — I480 Paroxysmal atrial fibrillation: Secondary | ICD-10-CM | POA: Diagnosis not present

## 2019-05-06 DIAGNOSIS — J690 Pneumonitis due to inhalation of food and vomit: Secondary | ICD-10-CM | POA: Diagnosis not present

## 2019-05-06 DIAGNOSIS — I480 Paroxysmal atrial fibrillation: Secondary | ICD-10-CM | POA: Diagnosis not present

## 2019-05-06 DIAGNOSIS — I69354 Hemiplegia and hemiparesis following cerebral infarction affecting left non-dominant side: Secondary | ICD-10-CM | POA: Diagnosis not present

## 2019-05-06 DIAGNOSIS — J189 Pneumonia, unspecified organism: Secondary | ICD-10-CM | POA: Diagnosis not present

## 2019-05-06 DIAGNOSIS — J90 Pleural effusion, not elsewhere classified: Secondary | ICD-10-CM | POA: Diagnosis not present

## 2019-05-06 DIAGNOSIS — J9621 Acute and chronic respiratory failure with hypoxia: Secondary | ICD-10-CM | POA: Diagnosis not present

## 2019-05-06 DIAGNOSIS — E1122 Type 2 diabetes mellitus with diabetic chronic kidney disease: Secondary | ICD-10-CM | POA: Diagnosis not present

## 2019-05-06 DIAGNOSIS — N182 Chronic kidney disease, stage 2 (mild): Secondary | ICD-10-CM | POA: Diagnosis not present

## 2019-05-07 DIAGNOSIS — I480 Paroxysmal atrial fibrillation: Secondary | ICD-10-CM | POA: Diagnosis not present

## 2019-05-07 DIAGNOSIS — N182 Chronic kidney disease, stage 2 (mild): Secondary | ICD-10-CM | POA: Diagnosis not present

## 2019-05-07 DIAGNOSIS — E1122 Type 2 diabetes mellitus with diabetic chronic kidney disease: Secondary | ICD-10-CM | POA: Diagnosis not present

## 2019-05-07 DIAGNOSIS — J9621 Acute and chronic respiratory failure with hypoxia: Secondary | ICD-10-CM | POA: Diagnosis not present

## 2019-05-07 DIAGNOSIS — I69354 Hemiplegia and hemiparesis following cerebral infarction affecting left non-dominant side: Secondary | ICD-10-CM | POA: Diagnosis not present

## 2019-05-07 DIAGNOSIS — J690 Pneumonitis due to inhalation of food and vomit: Secondary | ICD-10-CM | POA: Diagnosis not present

## 2019-05-07 DIAGNOSIS — J189 Pneumonia, unspecified organism: Secondary | ICD-10-CM | POA: Diagnosis not present

## 2019-05-08 DIAGNOSIS — R69 Illness, unspecified: Secondary | ICD-10-CM | POA: Diagnosis not present

## 2019-05-08 DIAGNOSIS — I69354 Hemiplegia and hemiparesis following cerebral infarction affecting left non-dominant side: Secondary | ICD-10-CM | POA: Diagnosis not present

## 2019-05-08 DIAGNOSIS — F419 Anxiety disorder, unspecified: Secondary | ICD-10-CM | POA: Diagnosis not present

## 2019-05-08 DIAGNOSIS — J90 Pleural effusion, not elsewhere classified: Secondary | ICD-10-CM | POA: Diagnosis not present

## 2019-05-08 DIAGNOSIS — Z9989 Dependence on other enabling machines and devices: Secondary | ICD-10-CM | POA: Diagnosis not present

## 2019-05-08 DIAGNOSIS — Z9911 Dependence on respirator [ventilator] status: Secondary | ICD-10-CM | POA: Diagnosis not present

## 2019-05-08 DIAGNOSIS — N182 Chronic kidney disease, stage 2 (mild): Secondary | ICD-10-CM | POA: Diagnosis not present

## 2019-05-08 DIAGNOSIS — I480 Paroxysmal atrial fibrillation: Secondary | ICD-10-CM | POA: Diagnosis not present

## 2019-05-08 DIAGNOSIS — J9621 Acute and chronic respiratory failure with hypoxia: Secondary | ICD-10-CM | POA: Diagnosis not present

## 2019-05-08 DIAGNOSIS — E1122 Type 2 diabetes mellitus with diabetic chronic kidney disease: Secondary | ICD-10-CM | POA: Diagnosis not present

## 2019-05-09 DIAGNOSIS — Z9911 Dependence on respirator [ventilator] status: Secondary | ICD-10-CM | POA: Diagnosis not present

## 2019-05-09 DIAGNOSIS — I6389 Other cerebral infarction: Secondary | ICD-10-CM | POA: Diagnosis not present

## 2019-05-09 DIAGNOSIS — R1312 Dysphagia, oropharyngeal phase: Secondary | ICD-10-CM | POA: Diagnosis not present

## 2019-05-09 DIAGNOSIS — E039 Hypothyroidism, unspecified: Secondary | ICD-10-CM | POA: Diagnosis not present

## 2019-05-09 DIAGNOSIS — J189 Pneumonia, unspecified organism: Secondary | ICD-10-CM | POA: Diagnosis not present

## 2019-05-09 DIAGNOSIS — Z9989 Dependence on other enabling machines and devices: Secondary | ICD-10-CM | POA: Diagnosis not present

## 2019-05-09 DIAGNOSIS — J90 Pleural effusion, not elsewhere classified: Secondary | ICD-10-CM | POA: Diagnosis not present

## 2019-05-09 DIAGNOSIS — J9621 Acute and chronic respiratory failure with hypoxia: Secondary | ICD-10-CM | POA: Diagnosis not present

## 2019-05-10 DIAGNOSIS — J9621 Acute and chronic respiratory failure with hypoxia: Secondary | ICD-10-CM | POA: Diagnosis not present

## 2019-05-10 DIAGNOSIS — M6281 Muscle weakness (generalized): Secondary | ICD-10-CM | POA: Diagnosis not present

## 2019-05-10 DIAGNOSIS — I480 Paroxysmal atrial fibrillation: Secondary | ICD-10-CM | POA: Diagnosis not present

## 2019-05-10 DIAGNOSIS — J90 Pleural effusion, not elsewhere classified: Secondary | ICD-10-CM | POA: Diagnosis not present

## 2019-05-10 DIAGNOSIS — Z9989 Dependence on other enabling machines and devices: Secondary | ICD-10-CM | POA: Diagnosis not present

## 2019-05-10 DIAGNOSIS — I69398 Other sequelae of cerebral infarction: Secondary | ICD-10-CM | POA: Diagnosis not present

## 2019-05-10 DIAGNOSIS — Z9911 Dependence on respirator [ventilator] status: Secondary | ICD-10-CM | POA: Diagnosis not present

## 2019-05-10 DIAGNOSIS — R5381 Other malaise: Secondary | ICD-10-CM | POA: Diagnosis not present

## 2019-05-10 DIAGNOSIS — R69 Illness, unspecified: Secondary | ICD-10-CM | POA: Diagnosis not present

## 2019-05-10 DIAGNOSIS — N182 Chronic kidney disease, stage 2 (mild): Secondary | ICD-10-CM | POA: Diagnosis not present

## 2019-05-10 DIAGNOSIS — I5033 Acute on chronic diastolic (congestive) heart failure: Secondary | ICD-10-CM | POA: Diagnosis not present

## 2019-05-10 DIAGNOSIS — I69354 Hemiplegia and hemiparesis following cerebral infarction affecting left non-dominant side: Secondary | ICD-10-CM | POA: Diagnosis not present

## 2019-05-10 DIAGNOSIS — R2689 Other abnormalities of gait and mobility: Secondary | ICD-10-CM | POA: Diagnosis not present

## 2019-05-10 DIAGNOSIS — E039 Hypothyroidism, unspecified: Secondary | ICD-10-CM | POA: Diagnosis not present

## 2019-05-10 DIAGNOSIS — L89313 Pressure ulcer of right buttock, stage 3: Secondary | ICD-10-CM | POA: Diagnosis not present

## 2019-05-10 DIAGNOSIS — E1122 Type 2 diabetes mellitus with diabetic chronic kidney disease: Secondary | ICD-10-CM | POA: Diagnosis not present

## 2019-05-11 DIAGNOSIS — J189 Pneumonia, unspecified organism: Secondary | ICD-10-CM | POA: Diagnosis not present

## 2019-05-11 DIAGNOSIS — I5033 Acute on chronic diastolic (congestive) heart failure: Secondary | ICD-10-CM | POA: Diagnosis not present

## 2019-05-11 DIAGNOSIS — I6389 Other cerebral infarction: Secondary | ICD-10-CM | POA: Diagnosis not present

## 2019-05-11 DIAGNOSIS — Z9911 Dependence on respirator [ventilator] status: Secondary | ICD-10-CM | POA: Diagnosis not present

## 2019-05-11 DIAGNOSIS — R0682 Tachypnea, not elsewhere classified: Secondary | ICD-10-CM | POA: Diagnosis not present

## 2019-05-11 DIAGNOSIS — J9621 Acute and chronic respiratory failure with hypoxia: Secondary | ICD-10-CM | POA: Diagnosis not present

## 2019-05-11 DIAGNOSIS — J90 Pleural effusion, not elsewhere classified: Secondary | ICD-10-CM | POA: Diagnosis not present

## 2019-05-11 DIAGNOSIS — Z9989 Dependence on other enabling machines and devices: Secondary | ICD-10-CM | POA: Diagnosis not present

## 2019-05-11 DIAGNOSIS — R7981 Abnormal blood-gas level: Secondary | ICD-10-CM | POA: Diagnosis not present

## 2019-05-11 DIAGNOSIS — R1312 Dysphagia, oropharyngeal phase: Secondary | ICD-10-CM | POA: Diagnosis not present

## 2019-05-12 DIAGNOSIS — R509 Fever, unspecified: Secondary | ICD-10-CM | POA: Diagnosis not present

## 2019-05-12 DIAGNOSIS — I69398 Other sequelae of cerebral infarction: Secondary | ICD-10-CM | POA: Diagnosis not present

## 2019-05-12 DIAGNOSIS — N179 Acute kidney failure, unspecified: Secondary | ICD-10-CM | POA: Diagnosis not present

## 2019-05-12 DIAGNOSIS — J90 Pleural effusion, not elsewhere classified: Secondary | ICD-10-CM | POA: Diagnosis not present

## 2019-05-12 DIAGNOSIS — J9621 Acute and chronic respiratory failure with hypoxia: Secondary | ICD-10-CM | POA: Diagnosis not present

## 2019-05-12 DIAGNOSIS — R6520 Severe sepsis without septic shock: Secondary | ICD-10-CM | POA: Diagnosis not present

## 2019-05-12 DIAGNOSIS — J9601 Acute respiratory failure with hypoxia: Secondary | ICD-10-CM | POA: Diagnosis not present

## 2019-05-12 DIAGNOSIS — J156 Pneumonia due to other aerobic Gram-negative bacteria: Secondary | ICD-10-CM | POA: Diagnosis not present

## 2019-05-12 DIAGNOSIS — I6389 Other cerebral infarction: Secondary | ICD-10-CM | POA: Diagnosis not present

## 2019-05-12 DIAGNOSIS — M6281 Muscle weakness (generalized): Secondary | ICD-10-CM | POA: Diagnosis not present

## 2019-05-12 DIAGNOSIS — D72829 Elevated white blood cell count, unspecified: Secondary | ICD-10-CM | POA: Diagnosis not present

## 2019-05-12 DIAGNOSIS — I5033 Acute on chronic diastolic (congestive) heart failure: Secondary | ICD-10-CM | POA: Diagnosis not present

## 2019-05-12 DIAGNOSIS — E1165 Type 2 diabetes mellitus with hyperglycemia: Secondary | ICD-10-CM | POA: Diagnosis not present

## 2019-05-12 DIAGNOSIS — Z9911 Dependence on respirator [ventilator] status: Secondary | ICD-10-CM | POA: Diagnosis not present

## 2019-05-12 DIAGNOSIS — J189 Pneumonia, unspecified organism: Secondary | ICD-10-CM | POA: Diagnosis not present

## 2019-05-12 DIAGNOSIS — Z9989 Dependence on other enabling machines and devices: Secondary | ICD-10-CM | POA: Diagnosis not present

## 2019-05-12 DIAGNOSIS — R7881 Bacteremia: Secondary | ICD-10-CM | POA: Diagnosis not present

## 2019-05-12 DIAGNOSIS — R2689 Other abnormalities of gait and mobility: Secondary | ICD-10-CM | POA: Diagnosis not present

## 2019-05-12 DIAGNOSIS — E039 Hypothyroidism, unspecified: Secondary | ICD-10-CM | POA: Diagnosis not present

## 2019-05-12 DIAGNOSIS — R5381 Other malaise: Secondary | ICD-10-CM | POA: Diagnosis not present

## 2019-05-13 DIAGNOSIS — I639 Cerebral infarction, unspecified: Secondary | ICD-10-CM | POA: Diagnosis not present

## 2019-05-13 DIAGNOSIS — I4891 Unspecified atrial fibrillation: Secondary | ICD-10-CM | POA: Diagnosis not present

## 2019-05-13 DIAGNOSIS — Z9989 Dependence on other enabling machines and devices: Secondary | ICD-10-CM | POA: Diagnosis not present

## 2019-05-13 DIAGNOSIS — J90 Pleural effusion, not elsewhere classified: Secondary | ICD-10-CM | POA: Diagnosis not present

## 2019-05-13 DIAGNOSIS — Z9911 Dependence on respirator [ventilator] status: Secondary | ICD-10-CM | POA: Diagnosis not present

## 2019-05-13 DIAGNOSIS — J9620 Acute and chronic respiratory failure, unspecified whether with hypoxia or hypercapnia: Secondary | ICD-10-CM | POA: Diagnosis not present

## 2019-05-13 DIAGNOSIS — J9621 Acute and chronic respiratory failure with hypoxia: Secondary | ICD-10-CM | POA: Diagnosis not present

## 2019-05-14 DIAGNOSIS — I639 Cerebral infarction, unspecified: Secondary | ICD-10-CM | POA: Diagnosis not present

## 2019-05-14 DIAGNOSIS — J9621 Acute and chronic respiratory failure with hypoxia: Secondary | ICD-10-CM | POA: Diagnosis not present

## 2019-05-14 DIAGNOSIS — Z9911 Dependence on respirator [ventilator] status: Secondary | ICD-10-CM | POA: Diagnosis not present

## 2019-05-14 DIAGNOSIS — I4891 Unspecified atrial fibrillation: Secondary | ICD-10-CM | POA: Diagnosis not present

## 2019-05-14 DIAGNOSIS — Z9989 Dependence on other enabling machines and devices: Secondary | ICD-10-CM | POA: Diagnosis not present

## 2019-05-14 DIAGNOSIS — J90 Pleural effusion, not elsewhere classified: Secondary | ICD-10-CM | POA: Diagnosis not present

## 2019-05-14 DIAGNOSIS — J9620 Acute and chronic respiratory failure, unspecified whether with hypoxia or hypercapnia: Secondary | ICD-10-CM | POA: Diagnosis not present

## 2019-05-15 DIAGNOSIS — I480 Paroxysmal atrial fibrillation: Secondary | ICD-10-CM | POA: Diagnosis not present

## 2019-05-15 DIAGNOSIS — E1122 Type 2 diabetes mellitus with diabetic chronic kidney disease: Secondary | ICD-10-CM | POA: Diagnosis not present

## 2019-05-15 DIAGNOSIS — R69 Illness, unspecified: Secondary | ICD-10-CM | POA: Diagnosis not present

## 2019-05-15 DIAGNOSIS — J9621 Acute and chronic respiratory failure with hypoxia: Secondary | ICD-10-CM | POA: Diagnosis not present

## 2019-05-15 DIAGNOSIS — J690 Pneumonitis due to inhalation of food and vomit: Secondary | ICD-10-CM | POA: Diagnosis not present

## 2019-05-15 DIAGNOSIS — F419 Anxiety disorder, unspecified: Secondary | ICD-10-CM | POA: Diagnosis not present

## 2019-05-15 DIAGNOSIS — N182 Chronic kidney disease, stage 2 (mild): Secondary | ICD-10-CM | POA: Diagnosis not present

## 2019-05-15 DIAGNOSIS — I69354 Hemiplegia and hemiparesis following cerebral infarction affecting left non-dominant side: Secondary | ICD-10-CM | POA: Diagnosis not present

## 2019-05-15 DIAGNOSIS — J189 Pneumonia, unspecified organism: Secondary | ICD-10-CM | POA: Diagnosis not present

## 2019-05-16 DIAGNOSIS — R5381 Other malaise: Secondary | ICD-10-CM | POA: Diagnosis not present

## 2019-05-16 DIAGNOSIS — J90 Pleural effusion, not elsewhere classified: Secondary | ICD-10-CM | POA: Diagnosis not present

## 2019-05-16 DIAGNOSIS — R69 Illness, unspecified: Secondary | ICD-10-CM | POA: Diagnosis not present

## 2019-05-16 DIAGNOSIS — I69398 Other sequelae of cerebral infarction: Secondary | ICD-10-CM | POA: Diagnosis not present

## 2019-05-16 DIAGNOSIS — Z09 Encounter for follow-up examination after completed treatment for conditions other than malignant neoplasm: Secondary | ICD-10-CM | POA: Diagnosis not present

## 2019-05-16 DIAGNOSIS — I5033 Acute on chronic diastolic (congestive) heart failure: Secondary | ICD-10-CM | POA: Diagnosis not present

## 2019-05-16 DIAGNOSIS — I6389 Other cerebral infarction: Secondary | ICD-10-CM | POA: Diagnosis not present

## 2019-05-16 DIAGNOSIS — I480 Paroxysmal atrial fibrillation: Secondary | ICD-10-CM | POA: Diagnosis not present

## 2019-05-16 DIAGNOSIS — M6281 Muscle weakness (generalized): Secondary | ICD-10-CM | POA: Diagnosis not present

## 2019-05-16 DIAGNOSIS — J9621 Acute and chronic respiratory failure with hypoxia: Secondary | ICD-10-CM | POA: Diagnosis not present

## 2019-05-16 DIAGNOSIS — J189 Pneumonia, unspecified organism: Secondary | ICD-10-CM | POA: Diagnosis not present

## 2019-05-16 DIAGNOSIS — R1312 Dysphagia, oropharyngeal phase: Secondary | ICD-10-CM | POA: Diagnosis not present

## 2019-05-16 DIAGNOSIS — J690 Pneumonitis due to inhalation of food and vomit: Secondary | ICD-10-CM | POA: Diagnosis not present

## 2019-05-16 DIAGNOSIS — R2689 Other abnormalities of gait and mobility: Secondary | ICD-10-CM | POA: Diagnosis not present

## 2019-05-17 DIAGNOSIS — J189 Pneumonia, unspecified organism: Secondary | ICD-10-CM | POA: Diagnosis not present

## 2019-05-17 DIAGNOSIS — I5033 Acute on chronic diastolic (congestive) heart failure: Secondary | ICD-10-CM | POA: Diagnosis not present

## 2019-05-17 DIAGNOSIS — J690 Pneumonitis due to inhalation of food and vomit: Secondary | ICD-10-CM

## 2019-05-17 DIAGNOSIS — E039 Hypothyroidism, unspecified: Secondary | ICD-10-CM | POA: Diagnosis not present

## 2019-05-17 DIAGNOSIS — L89313 Pressure ulcer of right buttock, stage 3: Secondary | ICD-10-CM | POA: Diagnosis not present

## 2019-05-17 DIAGNOSIS — J9621 Acute and chronic respiratory failure with hypoxia: Secondary | ICD-10-CM | POA: Diagnosis not present

## 2019-05-17 DIAGNOSIS — N182 Chronic kidney disease, stage 2 (mild): Secondary | ICD-10-CM | POA: Diagnosis not present

## 2019-05-17 DIAGNOSIS — R69 Illness, unspecified: Secondary | ICD-10-CM | POA: Diagnosis not present

## 2019-05-17 DIAGNOSIS — I69354 Hemiplegia and hemiparesis following cerebral infarction affecting left non-dominant side: Secondary | ICD-10-CM | POA: Diagnosis not present

## 2019-05-17 DIAGNOSIS — I480 Paroxysmal atrial fibrillation: Secondary | ICD-10-CM

## 2019-05-17 DIAGNOSIS — E1122 Type 2 diabetes mellitus with diabetic chronic kidney disease: Secondary | ICD-10-CM | POA: Diagnosis not present

## 2019-05-18 DIAGNOSIS — J189 Pneumonia, unspecified organism: Secondary | ICD-10-CM | POA: Diagnosis not present

## 2019-05-18 DIAGNOSIS — E039 Hypothyroidism, unspecified: Secondary | ICD-10-CM | POA: Diagnosis not present

## 2019-05-18 DIAGNOSIS — J9621 Acute and chronic respiratory failure with hypoxia: Secondary | ICD-10-CM | POA: Diagnosis not present

## 2019-05-18 DIAGNOSIS — J90 Pleural effusion, not elsewhere classified: Secondary | ICD-10-CM | POA: Diagnosis not present

## 2019-05-18 DIAGNOSIS — I480 Paroxysmal atrial fibrillation: Secondary | ICD-10-CM | POA: Diagnosis not present

## 2019-05-18 DIAGNOSIS — I6389 Other cerebral infarction: Secondary | ICD-10-CM | POA: Diagnosis not present

## 2019-05-18 DIAGNOSIS — I517 Cardiomegaly: Secondary | ICD-10-CM | POA: Diagnosis not present

## 2019-05-18 DIAGNOSIS — R1312 Dysphagia, oropharyngeal phase: Secondary | ICD-10-CM | POA: Diagnosis not present

## 2019-05-18 DIAGNOSIS — I5033 Acute on chronic diastolic (congestive) heart failure: Secondary | ICD-10-CM | POA: Diagnosis not present

## 2019-05-18 DIAGNOSIS — J690 Pneumonitis due to inhalation of food and vomit: Secondary | ICD-10-CM | POA: Diagnosis not present

## 2019-05-19 DIAGNOSIS — R5381 Other malaise: Secondary | ICD-10-CM | POA: Diagnosis not present

## 2019-05-19 DIAGNOSIS — M6281 Muscle weakness (generalized): Secondary | ICD-10-CM | POA: Diagnosis not present

## 2019-05-19 DIAGNOSIS — I6389 Other cerebral infarction: Secondary | ICD-10-CM | POA: Diagnosis not present

## 2019-05-19 DIAGNOSIS — I480 Paroxysmal atrial fibrillation: Secondary | ICD-10-CM

## 2019-05-19 DIAGNOSIS — I5033 Acute on chronic diastolic (congestive) heart failure: Secondary | ICD-10-CM | POA: Diagnosis not present

## 2019-05-19 DIAGNOSIS — J9621 Acute and chronic respiratory failure with hypoxia: Secondary | ICD-10-CM

## 2019-05-19 DIAGNOSIS — E039 Hypothyroidism, unspecified: Secondary | ICD-10-CM | POA: Diagnosis not present

## 2019-05-19 DIAGNOSIS — R1312 Dysphagia, oropharyngeal phase: Secondary | ICD-10-CM | POA: Diagnosis not present

## 2019-05-19 DIAGNOSIS — J189 Pneumonia, unspecified organism: Secondary | ICD-10-CM

## 2019-05-19 DIAGNOSIS — R2689 Other abnormalities of gait and mobility: Secondary | ICD-10-CM | POA: Diagnosis not present

## 2019-05-19 DIAGNOSIS — I69398 Other sequelae of cerebral infarction: Secondary | ICD-10-CM | POA: Diagnosis not present

## 2019-05-19 DIAGNOSIS — J690 Pneumonitis due to inhalation of food and vomit: Secondary | ICD-10-CM | POA: Diagnosis not present

## 2019-05-20 DIAGNOSIS — J9621 Acute and chronic respiratory failure with hypoxia: Secondary | ICD-10-CM

## 2019-05-20 DIAGNOSIS — I480 Paroxysmal atrial fibrillation: Secondary | ICD-10-CM | POA: Diagnosis not present

## 2019-05-20 DIAGNOSIS — I5033 Acute on chronic diastolic (congestive) heart failure: Secondary | ICD-10-CM | POA: Diagnosis not present

## 2019-05-20 DIAGNOSIS — J690 Pneumonitis due to inhalation of food and vomit: Secondary | ICD-10-CM | POA: Diagnosis not present

## 2019-05-20 DIAGNOSIS — I6389 Other cerebral infarction: Secondary | ICD-10-CM | POA: Diagnosis not present

## 2019-05-20 DIAGNOSIS — J189 Pneumonia, unspecified organism: Secondary | ICD-10-CM

## 2019-05-20 DIAGNOSIS — E039 Hypothyroidism, unspecified: Secondary | ICD-10-CM | POA: Diagnosis not present

## 2019-05-20 DIAGNOSIS — R1312 Dysphagia, oropharyngeal phase: Secondary | ICD-10-CM | POA: Diagnosis not present

## 2019-05-21 DIAGNOSIS — J9621 Acute and chronic respiratory failure with hypoxia: Secondary | ICD-10-CM | POA: Diagnosis not present

## 2019-05-21 DIAGNOSIS — R1312 Dysphagia, oropharyngeal phase: Secondary | ICD-10-CM | POA: Diagnosis not present

## 2019-05-21 DIAGNOSIS — I5033 Acute on chronic diastolic (congestive) heart failure: Secondary | ICD-10-CM | POA: Diagnosis not present

## 2019-05-21 DIAGNOSIS — J690 Pneumonitis due to inhalation of food and vomit: Secondary | ICD-10-CM | POA: Diagnosis not present

## 2019-05-21 DIAGNOSIS — J189 Pneumonia, unspecified organism: Secondary | ICD-10-CM | POA: Diagnosis not present

## 2019-05-21 DIAGNOSIS — I480 Paroxysmal atrial fibrillation: Secondary | ICD-10-CM

## 2019-05-21 DIAGNOSIS — I6389 Other cerebral infarction: Secondary | ICD-10-CM | POA: Diagnosis not present

## 2019-05-21 DIAGNOSIS — E039 Hypothyroidism, unspecified: Secondary | ICD-10-CM | POA: Diagnosis not present

## 2019-05-22 DIAGNOSIS — R0682 Tachypnea, not elsewhere classified: Secondary | ICD-10-CM | POA: Diagnosis not present

## 2019-05-22 DIAGNOSIS — Z9989 Dependence on other enabling machines and devices: Secondary | ICD-10-CM | POA: Diagnosis not present

## 2019-05-22 DIAGNOSIS — E1122 Type 2 diabetes mellitus with diabetic chronic kidney disease: Secondary | ICD-10-CM | POA: Diagnosis not present

## 2019-05-22 DIAGNOSIS — R5381 Other malaise: Secondary | ICD-10-CM | POA: Diagnosis not present

## 2019-05-22 DIAGNOSIS — N182 Chronic kidney disease, stage 2 (mild): Secondary | ICD-10-CM | POA: Diagnosis not present

## 2019-05-22 DIAGNOSIS — I69398 Other sequelae of cerebral infarction: Secondary | ICD-10-CM | POA: Diagnosis not present

## 2019-05-22 DIAGNOSIS — J90 Pleural effusion, not elsewhere classified: Secondary | ICD-10-CM | POA: Diagnosis not present

## 2019-05-22 DIAGNOSIS — J9621 Acute and chronic respiratory failure with hypoxia: Secondary | ICD-10-CM | POA: Diagnosis not present

## 2019-05-22 DIAGNOSIS — I69354 Hemiplegia and hemiparesis following cerebral infarction affecting left non-dominant side: Secondary | ICD-10-CM | POA: Diagnosis not present

## 2019-05-22 DIAGNOSIS — R2689 Other abnormalities of gait and mobility: Secondary | ICD-10-CM | POA: Diagnosis not present

## 2019-05-22 DIAGNOSIS — I480 Paroxysmal atrial fibrillation: Secondary | ICD-10-CM | POA: Diagnosis not present

## 2019-05-22 DIAGNOSIS — Z9911 Dependence on respirator [ventilator] status: Secondary | ICD-10-CM | POA: Diagnosis not present

## 2019-05-22 DIAGNOSIS — M6281 Muscle weakness (generalized): Secondary | ICD-10-CM | POA: Diagnosis not present

## 2019-05-23 DIAGNOSIS — R1312 Dysphagia, oropharyngeal phase: Secondary | ICD-10-CM | POA: Diagnosis not present

## 2019-05-23 DIAGNOSIS — I5033 Acute on chronic diastolic (congestive) heart failure: Secondary | ICD-10-CM | POA: Diagnosis not present

## 2019-05-23 DIAGNOSIS — J9621 Acute and chronic respiratory failure with hypoxia: Secondary | ICD-10-CM | POA: Diagnosis not present

## 2019-05-23 DIAGNOSIS — J90 Pleural effusion, not elsewhere classified: Secondary | ICD-10-CM | POA: Diagnosis not present

## 2019-05-23 DIAGNOSIS — I6389 Other cerebral infarction: Secondary | ICD-10-CM | POA: Diagnosis not present

## 2019-05-23 DIAGNOSIS — Z9911 Dependence on respirator [ventilator] status: Secondary | ICD-10-CM | POA: Diagnosis not present

## 2019-05-23 DIAGNOSIS — J189 Pneumonia, unspecified organism: Secondary | ICD-10-CM | POA: Diagnosis not present

## 2019-05-23 DIAGNOSIS — Z9989 Dependence on other enabling machines and devices: Secondary | ICD-10-CM | POA: Diagnosis not present

## 2019-05-24 DIAGNOSIS — I639 Cerebral infarction, unspecified: Secondary | ICD-10-CM | POA: Diagnosis not present

## 2019-05-24 DIAGNOSIS — I5033 Acute on chronic diastolic (congestive) heart failure: Secondary | ICD-10-CM | POA: Diagnosis not present

## 2019-05-24 DIAGNOSIS — N179 Acute kidney failure, unspecified: Secondary | ICD-10-CM | POA: Diagnosis not present

## 2019-05-24 DIAGNOSIS — Z9989 Dependence on other enabling machines and devices: Secondary | ICD-10-CM | POA: Diagnosis not present

## 2019-05-24 DIAGNOSIS — D72829 Elevated white blood cell count, unspecified: Secondary | ICD-10-CM | POA: Diagnosis not present

## 2019-05-24 DIAGNOSIS — E039 Hypothyroidism, unspecified: Secondary | ICD-10-CM | POA: Diagnosis not present

## 2019-05-24 DIAGNOSIS — J90 Pleural effusion, not elsewhere classified: Secondary | ICD-10-CM | POA: Diagnosis not present

## 2019-05-24 DIAGNOSIS — K2901 Acute gastritis with bleeding: Secondary | ICD-10-CM | POA: Diagnosis not present

## 2019-05-24 DIAGNOSIS — R5381 Other malaise: Secondary | ICD-10-CM | POA: Diagnosis not present

## 2019-05-24 DIAGNOSIS — R1310 Dysphagia, unspecified: Secondary | ICD-10-CM | POA: Diagnosis not present

## 2019-05-24 DIAGNOSIS — E1122 Type 2 diabetes mellitus with diabetic chronic kidney disease: Secondary | ICD-10-CM | POA: Diagnosis not present

## 2019-05-24 DIAGNOSIS — R2689 Other abnormalities of gait and mobility: Secondary | ICD-10-CM | POA: Diagnosis not present

## 2019-05-24 DIAGNOSIS — N182 Chronic kidney disease, stage 2 (mild): Secondary | ICD-10-CM | POA: Diagnosis not present

## 2019-05-24 DIAGNOSIS — J918 Pleural effusion in other conditions classified elsewhere: Secondary | ICD-10-CM | POA: Diagnosis not present

## 2019-05-24 DIAGNOSIS — M6281 Muscle weakness (generalized): Secondary | ICD-10-CM | POA: Diagnosis not present

## 2019-05-24 DIAGNOSIS — Z9911 Dependence on respirator [ventilator] status: Secondary | ICD-10-CM | POA: Diagnosis not present

## 2019-05-24 DIAGNOSIS — J9601 Acute respiratory failure with hypoxia: Secondary | ICD-10-CM | POA: Diagnosis not present

## 2019-05-24 DIAGNOSIS — I69354 Hemiplegia and hemiparesis following cerebral infarction affecting left non-dominant side: Secondary | ICD-10-CM | POA: Diagnosis not present

## 2019-05-24 DIAGNOSIS — L89313 Pressure ulcer of right buttock, stage 3: Secondary | ICD-10-CM | POA: Diagnosis not present

## 2019-05-24 DIAGNOSIS — G8190 Hemiplegia, unspecified affecting unspecified side: Secondary | ICD-10-CM | POA: Diagnosis not present

## 2019-05-24 DIAGNOSIS — I69398 Other sequelae of cerebral infarction: Secondary | ICD-10-CM | POA: Diagnosis not present

## 2019-05-24 DIAGNOSIS — I480 Paroxysmal atrial fibrillation: Secondary | ICD-10-CM | POA: Diagnosis not present

## 2019-05-24 DIAGNOSIS — R7881 Bacteremia: Secondary | ICD-10-CM | POA: Diagnosis not present

## 2019-05-24 DIAGNOSIS — R509 Fever, unspecified: Secondary | ICD-10-CM | POA: Diagnosis not present

## 2019-05-24 DIAGNOSIS — J9621 Acute and chronic respiratory failure with hypoxia: Secondary | ICD-10-CM | POA: Diagnosis not present

## 2019-05-25 DIAGNOSIS — D72829 Elevated white blood cell count, unspecified: Secondary | ICD-10-CM | POA: Diagnosis not present

## 2019-05-25 DIAGNOSIS — I639 Cerebral infarction, unspecified: Secondary | ICD-10-CM | POA: Diagnosis not present

## 2019-05-25 DIAGNOSIS — R7881 Bacteremia: Secondary | ICD-10-CM | POA: Diagnosis not present

## 2019-05-25 DIAGNOSIS — J9601 Acute respiratory failure with hypoxia: Secondary | ICD-10-CM | POA: Diagnosis not present

## 2019-05-25 DIAGNOSIS — I5033 Acute on chronic diastolic (congestive) heart failure: Secondary | ICD-10-CM | POA: Diagnosis not present

## 2019-05-25 DIAGNOSIS — N179 Acute kidney failure, unspecified: Secondary | ICD-10-CM | POA: Diagnosis not present

## 2019-05-25 DIAGNOSIS — R1312 Dysphagia, oropharyngeal phase: Secondary | ICD-10-CM | POA: Diagnosis not present

## 2019-05-25 DIAGNOSIS — F419 Anxiety disorder, unspecified: Secondary | ICD-10-CM | POA: Diagnosis not present

## 2019-05-25 DIAGNOSIS — R509 Fever, unspecified: Secondary | ICD-10-CM | POA: Diagnosis not present

## 2019-05-25 DIAGNOSIS — R69 Illness, unspecified: Secondary | ICD-10-CM | POA: Diagnosis not present

## 2019-05-25 DIAGNOSIS — G8190 Hemiplegia, unspecified affecting unspecified side: Secondary | ICD-10-CM | POA: Diagnosis not present

## 2019-05-25 DIAGNOSIS — J90 Pleural effusion, not elsewhere classified: Secondary | ICD-10-CM | POA: Diagnosis not present

## 2019-05-25 DIAGNOSIS — E039 Hypothyroidism, unspecified: Secondary | ICD-10-CM | POA: Diagnosis not present

## 2019-05-25 DIAGNOSIS — J9621 Acute and chronic respiratory failure with hypoxia: Secondary | ICD-10-CM | POA: Diagnosis not present

## 2019-05-25 DIAGNOSIS — Z9989 Dependence on other enabling machines and devices: Secondary | ICD-10-CM | POA: Diagnosis not present

## 2019-05-25 DIAGNOSIS — Z9911 Dependence on respirator [ventilator] status: Secondary | ICD-10-CM | POA: Diagnosis not present

## 2019-05-25 DIAGNOSIS — I6389 Other cerebral infarction: Secondary | ICD-10-CM | POA: Diagnosis not present

## 2019-05-25 DIAGNOSIS — R1310 Dysphagia, unspecified: Secondary | ICD-10-CM | POA: Diagnosis not present

## 2019-05-25 DIAGNOSIS — E1165 Type 2 diabetes mellitus with hyperglycemia: Secondary | ICD-10-CM | POA: Diagnosis not present

## 2019-05-26 DIAGNOSIS — Z9911 Dependence on respirator [ventilator] status: Secondary | ICD-10-CM | POA: Diagnosis not present

## 2019-05-26 DIAGNOSIS — R69 Illness, unspecified: Secondary | ICD-10-CM | POA: Diagnosis not present

## 2019-05-26 DIAGNOSIS — I5033 Acute on chronic diastolic (congestive) heart failure: Secondary | ICD-10-CM | POA: Diagnosis not present

## 2019-05-26 DIAGNOSIS — J90 Pleural effusion, not elsewhere classified: Secondary | ICD-10-CM | POA: Diagnosis not present

## 2019-05-26 DIAGNOSIS — Z9989 Dependence on other enabling machines and devices: Secondary | ICD-10-CM | POA: Diagnosis not present

## 2019-05-26 DIAGNOSIS — J9621 Acute and chronic respiratory failure with hypoxia: Secondary | ICD-10-CM | POA: Diagnosis not present

## 2019-05-26 DIAGNOSIS — I6389 Other cerebral infarction: Secondary | ICD-10-CM | POA: Diagnosis not present

## 2019-05-26 DIAGNOSIS — E039 Hypothyroidism, unspecified: Secondary | ICD-10-CM | POA: Diagnosis not present

## 2019-05-27 DIAGNOSIS — J9691 Respiratory failure, unspecified with hypoxia: Secondary | ICD-10-CM | POA: Diagnosis not present

## 2019-05-27 DIAGNOSIS — I639 Cerebral infarction, unspecified: Secondary | ICD-10-CM | POA: Diagnosis not present

## 2019-05-27 DIAGNOSIS — Z9989 Dependence on other enabling machines and devices: Secondary | ICD-10-CM | POA: Diagnosis not present

## 2019-05-27 DIAGNOSIS — E119 Type 2 diabetes mellitus without complications: Secondary | ICD-10-CM | POA: Diagnosis not present

## 2019-05-27 DIAGNOSIS — J9621 Acute and chronic respiratory failure with hypoxia: Secondary | ICD-10-CM | POA: Diagnosis not present

## 2019-05-27 DIAGNOSIS — J90 Pleural effusion, not elsewhere classified: Secondary | ICD-10-CM | POA: Diagnosis not present

## 2019-05-27 DIAGNOSIS — Z9911 Dependence on respirator [ventilator] status: Secondary | ICD-10-CM | POA: Diagnosis not present

## 2019-05-28 DIAGNOSIS — E039 Hypothyroidism, unspecified: Secondary | ICD-10-CM | POA: Diagnosis not present

## 2019-05-28 DIAGNOSIS — E119 Type 2 diabetes mellitus without complications: Secondary | ICD-10-CM | POA: Diagnosis not present

## 2019-05-28 DIAGNOSIS — I639 Cerebral infarction, unspecified: Secondary | ICD-10-CM | POA: Diagnosis not present

## 2019-05-28 DIAGNOSIS — J90 Pleural effusion, not elsewhere classified: Secondary | ICD-10-CM | POA: Diagnosis not present

## 2019-05-28 DIAGNOSIS — Z9989 Dependence on other enabling machines and devices: Secondary | ICD-10-CM | POA: Diagnosis not present

## 2019-05-28 DIAGNOSIS — J9691 Respiratory failure, unspecified with hypoxia: Secondary | ICD-10-CM | POA: Diagnosis not present

## 2019-05-28 DIAGNOSIS — J9621 Acute and chronic respiratory failure with hypoxia: Secondary | ICD-10-CM | POA: Diagnosis not present

## 2019-05-28 DIAGNOSIS — R0489 Hemorrhage from other sites in respiratory passages: Secondary | ICD-10-CM | POA: Diagnosis not present

## 2019-05-28 DIAGNOSIS — L89313 Pressure ulcer of right buttock, stage 3: Secondary | ICD-10-CM | POA: Diagnosis not present

## 2019-05-28 DIAGNOSIS — I5033 Acute on chronic diastolic (congestive) heart failure: Secondary | ICD-10-CM | POA: Diagnosis not present

## 2019-05-28 DIAGNOSIS — Z9911 Dependence on respirator [ventilator] status: Secondary | ICD-10-CM | POA: Diagnosis not present

## 2019-05-29 DIAGNOSIS — M6281 Muscle weakness (generalized): Secondary | ICD-10-CM | POA: Diagnosis not present

## 2019-05-29 DIAGNOSIS — G8190 Hemiplegia, unspecified affecting unspecified side: Secondary | ICD-10-CM | POA: Diagnosis not present

## 2019-05-29 DIAGNOSIS — N182 Chronic kidney disease, stage 2 (mild): Secondary | ICD-10-CM | POA: Diagnosis not present

## 2019-05-29 DIAGNOSIS — R509 Fever, unspecified: Secondary | ICD-10-CM | POA: Diagnosis not present

## 2019-05-29 DIAGNOSIS — N179 Acute kidney failure, unspecified: Secondary | ICD-10-CM | POA: Diagnosis not present

## 2019-05-29 DIAGNOSIS — J189 Pneumonia, unspecified organism: Secondary | ICD-10-CM

## 2019-05-29 DIAGNOSIS — I69398 Other sequelae of cerebral infarction: Secondary | ICD-10-CM | POA: Diagnosis not present

## 2019-05-29 DIAGNOSIS — J690 Pneumonitis due to inhalation of food and vomit: Secondary | ICD-10-CM

## 2019-05-29 DIAGNOSIS — R5381 Other malaise: Secondary | ICD-10-CM | POA: Diagnosis not present

## 2019-05-29 DIAGNOSIS — E1122 Type 2 diabetes mellitus with diabetic chronic kidney disease: Secondary | ICD-10-CM | POA: Diagnosis not present

## 2019-05-29 DIAGNOSIS — I69354 Hemiplegia and hemiparesis following cerebral infarction affecting left non-dominant side: Secondary | ICD-10-CM | POA: Diagnosis not present

## 2019-05-29 DIAGNOSIS — J9601 Acute respiratory failure with hypoxia: Secondary | ICD-10-CM | POA: Diagnosis not present

## 2019-05-29 DIAGNOSIS — R2689 Other abnormalities of gait and mobility: Secondary | ICD-10-CM | POA: Diagnosis not present

## 2019-05-29 DIAGNOSIS — I639 Cerebral infarction, unspecified: Secondary | ICD-10-CM | POA: Diagnosis not present

## 2019-05-29 DIAGNOSIS — J9621 Acute and chronic respiratory failure with hypoxia: Secondary | ICD-10-CM | POA: Diagnosis not present

## 2019-05-29 DIAGNOSIS — D72829 Elevated white blood cell count, unspecified: Secondary | ICD-10-CM | POA: Diagnosis not present

## 2019-05-29 DIAGNOSIS — B961 Klebsiella pneumoniae [K. pneumoniae] as the cause of diseases classified elsewhere: Secondary | ICD-10-CM | POA: Diagnosis not present

## 2019-05-29 DIAGNOSIS — I480 Paroxysmal atrial fibrillation: Secondary | ICD-10-CM

## 2019-05-30 ENCOUNTER — Ambulatory Visit: Payer: No Typology Code available for payment source | Admitting: General Surgery

## 2019-05-30 ENCOUNTER — Encounter: Payer: Self-pay | Admitting: General Surgery

## 2019-05-30 DIAGNOSIS — J690 Pneumonitis due to inhalation of food and vomit: Secondary | ICD-10-CM

## 2019-05-30 DIAGNOSIS — I639 Cerebral infarction, unspecified: Secondary | ICD-10-CM | POA: Diagnosis not present

## 2019-05-30 DIAGNOSIS — I5043 Acute on chronic combined systolic (congestive) and diastolic (congestive) heart failure: Secondary | ICD-10-CM | POA: Diagnosis not present

## 2019-05-30 DIAGNOSIS — I480 Paroxysmal atrial fibrillation: Secondary | ICD-10-CM | POA: Diagnosis not present

## 2019-05-30 DIAGNOSIS — A419 Sepsis, unspecified organism: Secondary | ICD-10-CM | POA: Diagnosis not present

## 2019-05-30 DIAGNOSIS — J189 Pneumonia, unspecified organism: Secondary | ICD-10-CM | POA: Diagnosis not present

## 2019-05-30 DIAGNOSIS — J9621 Acute and chronic respiratory failure with hypoxia: Secondary | ICD-10-CM | POA: Diagnosis not present

## 2019-05-31 DIAGNOSIS — K2901 Acute gastritis with bleeding: Secondary | ICD-10-CM | POA: Diagnosis not present

## 2019-05-31 DIAGNOSIS — M6281 Muscle weakness (generalized): Secondary | ICD-10-CM | POA: Diagnosis not present

## 2019-05-31 DIAGNOSIS — R2689 Other abnormalities of gait and mobility: Secondary | ICD-10-CM | POA: Diagnosis not present

## 2019-05-31 DIAGNOSIS — I5043 Acute on chronic combined systolic (congestive) and diastolic (congestive) heart failure: Secondary | ICD-10-CM | POA: Diagnosis not present

## 2019-05-31 DIAGNOSIS — E039 Hypothyroidism, unspecified: Secondary | ICD-10-CM | POA: Diagnosis not present

## 2019-05-31 DIAGNOSIS — I69398 Other sequelae of cerebral infarction: Secondary | ICD-10-CM | POA: Diagnosis not present

## 2019-05-31 DIAGNOSIS — R112 Nausea with vomiting, unspecified: Secondary | ICD-10-CM | POA: Diagnosis not present

## 2019-05-31 DIAGNOSIS — I480 Paroxysmal atrial fibrillation: Secondary | ICD-10-CM

## 2019-05-31 DIAGNOSIS — J9621 Acute and chronic respiratory failure with hypoxia: Secondary | ICD-10-CM

## 2019-05-31 DIAGNOSIS — J189 Pneumonia, unspecified organism: Secondary | ICD-10-CM | POA: Diagnosis not present

## 2019-05-31 DIAGNOSIS — I69354 Hemiplegia and hemiparesis following cerebral infarction affecting left non-dominant side: Secondary | ICD-10-CM | POA: Diagnosis not present

## 2019-05-31 DIAGNOSIS — E1122 Type 2 diabetes mellitus with diabetic chronic kidney disease: Secondary | ICD-10-CM | POA: Diagnosis not present

## 2019-05-31 DIAGNOSIS — R5381 Other malaise: Secondary | ICD-10-CM | POA: Diagnosis not present

## 2019-05-31 DIAGNOSIS — N182 Chronic kidney disease, stage 2 (mild): Secondary | ICD-10-CM | POA: Diagnosis not present

## 2019-05-31 DIAGNOSIS — J690 Pneumonitis due to inhalation of food and vomit: Secondary | ICD-10-CM

## 2019-06-01 DIAGNOSIS — J189 Pneumonia, unspecified organism: Secondary | ICD-10-CM

## 2019-06-01 DIAGNOSIS — E039 Hypothyroidism, unspecified: Secondary | ICD-10-CM | POA: Diagnosis not present

## 2019-06-01 DIAGNOSIS — I5043 Acute on chronic combined systolic (congestive) and diastolic (congestive) heart failure: Secondary | ICD-10-CM | POA: Diagnosis not present

## 2019-06-01 DIAGNOSIS — I480 Paroxysmal atrial fibrillation: Secondary | ICD-10-CM

## 2019-06-01 DIAGNOSIS — I639 Cerebral infarction, unspecified: Secondary | ICD-10-CM | POA: Diagnosis not present

## 2019-06-01 DIAGNOSIS — J690 Pneumonitis due to inhalation of food and vomit: Secondary | ICD-10-CM | POA: Diagnosis not present

## 2019-06-01 DIAGNOSIS — J9621 Acute and chronic respiratory failure with hypoxia: Secondary | ICD-10-CM

## 2019-06-02 DIAGNOSIS — E1122 Type 2 diabetes mellitus with diabetic chronic kidney disease: Secondary | ICD-10-CM | POA: Diagnosis not present

## 2019-06-02 DIAGNOSIS — I69354 Hemiplegia and hemiparesis following cerebral infarction affecting left non-dominant side: Secondary | ICD-10-CM | POA: Diagnosis not present

## 2019-06-02 DIAGNOSIS — J690 Pneumonitis due to inhalation of food and vomit: Secondary | ICD-10-CM | POA: Diagnosis not present

## 2019-06-02 DIAGNOSIS — J189 Pneumonia, unspecified organism: Secondary | ICD-10-CM

## 2019-06-02 DIAGNOSIS — J9621 Acute and chronic respiratory failure with hypoxia: Secondary | ICD-10-CM

## 2019-06-02 DIAGNOSIS — N182 Chronic kidney disease, stage 2 (mild): Secondary | ICD-10-CM | POA: Diagnosis not present

## 2019-06-02 DIAGNOSIS — I480 Paroxysmal atrial fibrillation: Secondary | ICD-10-CM | POA: Diagnosis not present

## 2019-06-03 DIAGNOSIS — J189 Pneumonia, unspecified organism: Secondary | ICD-10-CM

## 2019-06-03 DIAGNOSIS — N182 Chronic kidney disease, stage 2 (mild): Secondary | ICD-10-CM | POA: Diagnosis not present

## 2019-06-03 DIAGNOSIS — I480 Paroxysmal atrial fibrillation: Secondary | ICD-10-CM | POA: Diagnosis not present

## 2019-06-03 DIAGNOSIS — J690 Pneumonitis due to inhalation of food and vomit: Secondary | ICD-10-CM

## 2019-06-03 DIAGNOSIS — I69354 Hemiplegia and hemiparesis following cerebral infarction affecting left non-dominant side: Secondary | ICD-10-CM | POA: Diagnosis not present

## 2019-06-03 DIAGNOSIS — J9621 Acute and chronic respiratory failure with hypoxia: Secondary | ICD-10-CM | POA: Diagnosis not present

## 2019-06-03 DIAGNOSIS — E1122 Type 2 diabetes mellitus with diabetic chronic kidney disease: Secondary | ICD-10-CM | POA: Diagnosis not present

## 2019-06-04 DIAGNOSIS — N182 Chronic kidney disease, stage 2 (mild): Secondary | ICD-10-CM | POA: Diagnosis not present

## 2019-06-04 DIAGNOSIS — E1122 Type 2 diabetes mellitus with diabetic chronic kidney disease: Secondary | ICD-10-CM | POA: Diagnosis not present

## 2019-06-04 DIAGNOSIS — J9621 Acute and chronic respiratory failure with hypoxia: Secondary | ICD-10-CM | POA: Diagnosis not present

## 2019-06-04 DIAGNOSIS — I480 Paroxysmal atrial fibrillation: Secondary | ICD-10-CM | POA: Diagnosis not present

## 2019-06-04 DIAGNOSIS — J189 Pneumonia, unspecified organism: Secondary | ICD-10-CM

## 2019-06-04 DIAGNOSIS — J690 Pneumonitis due to inhalation of food and vomit: Secondary | ICD-10-CM | POA: Diagnosis not present

## 2019-06-04 DIAGNOSIS — I69354 Hemiplegia and hemiparesis following cerebral infarction affecting left non-dominant side: Secondary | ICD-10-CM | POA: Diagnosis not present

## 2019-06-05 DIAGNOSIS — J189 Pneumonia, unspecified organism: Secondary | ICD-10-CM | POA: Diagnosis not present

## 2019-06-05 DIAGNOSIS — J690 Pneumonitis due to inhalation of food and vomit: Secondary | ICD-10-CM

## 2019-06-05 DIAGNOSIS — J9621 Acute and chronic respiratory failure with hypoxia: Secondary | ICD-10-CM

## 2019-06-05 DIAGNOSIS — I69354 Hemiplegia and hemiparesis following cerebral infarction affecting left non-dominant side: Secondary | ICD-10-CM | POA: Diagnosis not present

## 2019-06-05 DIAGNOSIS — N182 Chronic kidney disease, stage 2 (mild): Secondary | ICD-10-CM | POA: Diagnosis not present

## 2019-06-05 DIAGNOSIS — E1122 Type 2 diabetes mellitus with diabetic chronic kidney disease: Secondary | ICD-10-CM | POA: Diagnosis not present

## 2019-06-05 DIAGNOSIS — I480 Paroxysmal atrial fibrillation: Secondary | ICD-10-CM

## 2019-06-06 DIAGNOSIS — I5043 Acute on chronic combined systolic (congestive) and diastolic (congestive) heart failure: Secondary | ICD-10-CM | POA: Diagnosis not present

## 2019-06-06 DIAGNOSIS — I639 Cerebral infarction, unspecified: Secondary | ICD-10-CM | POA: Diagnosis not present

## 2019-06-06 DIAGNOSIS — E039 Hypothyroidism, unspecified: Secondary | ICD-10-CM | POA: Diagnosis not present

## 2019-06-06 DIAGNOSIS — J690 Pneumonitis due to inhalation of food and vomit: Secondary | ICD-10-CM | POA: Diagnosis not present

## 2019-06-06 DIAGNOSIS — I480 Paroxysmal atrial fibrillation: Secondary | ICD-10-CM | POA: Diagnosis not present

## 2019-06-06 DIAGNOSIS — J189 Pneumonia, unspecified organism: Secondary | ICD-10-CM

## 2019-06-06 DIAGNOSIS — J9621 Acute and chronic respiratory failure with hypoxia: Secondary | ICD-10-CM

## 2019-06-07 DIAGNOSIS — I509 Heart failure, unspecified: Secondary | ICD-10-CM | POA: Diagnosis not present

## 2019-06-07 DIAGNOSIS — I69354 Hemiplegia and hemiparesis following cerebral infarction affecting left non-dominant side: Secondary | ICD-10-CM | POA: Diagnosis not present

## 2019-06-07 DIAGNOSIS — R5381 Other malaise: Secondary | ICD-10-CM | POA: Diagnosis not present

## 2019-06-07 DIAGNOSIS — I480 Paroxysmal atrial fibrillation: Secondary | ICD-10-CM | POA: Diagnosis not present

## 2019-06-07 DIAGNOSIS — I69398 Other sequelae of cerebral infarction: Secondary | ICD-10-CM | POA: Diagnosis not present

## 2019-06-07 DIAGNOSIS — J189 Pneumonia, unspecified organism: Secondary | ICD-10-CM

## 2019-06-07 DIAGNOSIS — N182 Chronic kidney disease, stage 2 (mild): Secondary | ICD-10-CM | POA: Diagnosis not present

## 2019-06-07 DIAGNOSIS — R0489 Hemorrhage from other sites in respiratory passages: Secondary | ICD-10-CM | POA: Diagnosis not present

## 2019-06-07 DIAGNOSIS — J9621 Acute and chronic respiratory failure with hypoxia: Secondary | ICD-10-CM | POA: Diagnosis not present

## 2019-06-07 DIAGNOSIS — E1122 Type 2 diabetes mellitus with diabetic chronic kidney disease: Secondary | ICD-10-CM | POA: Diagnosis not present

## 2019-06-07 DIAGNOSIS — K2901 Acute gastritis with bleeding: Secondary | ICD-10-CM | POA: Diagnosis not present

## 2019-06-07 DIAGNOSIS — J690 Pneumonitis due to inhalation of food and vomit: Secondary | ICD-10-CM | POA: Diagnosis not present

## 2019-06-07 DIAGNOSIS — M6281 Muscle weakness (generalized): Secondary | ICD-10-CM | POA: Diagnosis not present

## 2019-06-07 DIAGNOSIS — E039 Hypothyroidism, unspecified: Secondary | ICD-10-CM | POA: Diagnosis not present

## 2019-06-07 DIAGNOSIS — R2689 Other abnormalities of gait and mobility: Secondary | ICD-10-CM | POA: Diagnosis not present

## 2019-06-07 DIAGNOSIS — I5043 Acute on chronic combined systolic (congestive) and diastolic (congestive) heart failure: Secondary | ICD-10-CM | POA: Diagnosis not present

## 2019-06-08 DIAGNOSIS — J90 Pleural effusion, not elsewhere classified: Secondary | ICD-10-CM | POA: Diagnosis not present

## 2019-06-08 DIAGNOSIS — E039 Hypothyroidism, unspecified: Secondary | ICD-10-CM | POA: Diagnosis not present

## 2019-06-08 DIAGNOSIS — I5043 Acute on chronic combined systolic (congestive) and diastolic (congestive) heart failure: Secondary | ICD-10-CM | POA: Diagnosis not present

## 2019-06-08 DIAGNOSIS — I639 Cerebral infarction, unspecified: Secondary | ICD-10-CM | POA: Diagnosis not present

## 2019-06-08 DIAGNOSIS — J189 Pneumonia, unspecified organism: Secondary | ICD-10-CM | POA: Diagnosis not present

## 2019-06-09 DIAGNOSIS — R5381 Other malaise: Secondary | ICD-10-CM | POA: Diagnosis not present

## 2019-06-09 DIAGNOSIS — I639 Cerebral infarction, unspecified: Secondary | ICD-10-CM | POA: Diagnosis not present

## 2019-06-09 DIAGNOSIS — M6281 Muscle weakness (generalized): Secondary | ICD-10-CM | POA: Diagnosis not present

## 2019-06-09 DIAGNOSIS — I69398 Other sequelae of cerebral infarction: Secondary | ICD-10-CM | POA: Diagnosis not present

## 2019-06-09 DIAGNOSIS — I5043 Acute on chronic combined systolic (congestive) and diastolic (congestive) heart failure: Secondary | ICD-10-CM | POA: Diagnosis not present

## 2019-06-09 DIAGNOSIS — R2689 Other abnormalities of gait and mobility: Secondary | ICD-10-CM | POA: Diagnosis not present

## 2019-06-09 DIAGNOSIS — R1312 Dysphagia, oropharyngeal phase: Secondary | ICD-10-CM | POA: Diagnosis not present

## 2019-06-09 DIAGNOSIS — J189 Pneumonia, unspecified organism: Secondary | ICD-10-CM | POA: Diagnosis not present

## 2019-06-09 DIAGNOSIS — J690 Pneumonitis due to inhalation of food and vomit: Secondary | ICD-10-CM | POA: Diagnosis not present

## 2019-06-09 DIAGNOSIS — I480 Paroxysmal atrial fibrillation: Secondary | ICD-10-CM

## 2019-06-09 DIAGNOSIS — J9621 Acute and chronic respiratory failure with hypoxia: Secondary | ICD-10-CM | POA: Diagnosis not present

## 2019-06-10 DIAGNOSIS — J690 Pneumonitis due to inhalation of food and vomit: Secondary | ICD-10-CM

## 2019-06-10 DIAGNOSIS — I4891 Unspecified atrial fibrillation: Secondary | ICD-10-CM | POA: Diagnosis not present

## 2019-06-10 DIAGNOSIS — I639 Cerebral infarction, unspecified: Secondary | ICD-10-CM | POA: Diagnosis not present

## 2019-06-10 DIAGNOSIS — J9621 Acute and chronic respiratory failure with hypoxia: Secondary | ICD-10-CM | POA: Diagnosis not present

## 2019-06-10 DIAGNOSIS — J189 Pneumonia, unspecified organism: Secondary | ICD-10-CM | POA: Diagnosis not present

## 2019-06-10 DIAGNOSIS — J9620 Acute and chronic respiratory failure, unspecified whether with hypoxia or hypercapnia: Secondary | ICD-10-CM | POA: Diagnosis not present

## 2019-06-10 DIAGNOSIS — I480 Paroxysmal atrial fibrillation: Secondary | ICD-10-CM | POA: Diagnosis not present

## 2019-06-11 DIAGNOSIS — J690 Pneumonitis due to inhalation of food and vomit: Secondary | ICD-10-CM | POA: Diagnosis not present

## 2019-06-11 DIAGNOSIS — I639 Cerebral infarction, unspecified: Secondary | ICD-10-CM | POA: Diagnosis not present

## 2019-06-11 DIAGNOSIS — I480 Paroxysmal atrial fibrillation: Secondary | ICD-10-CM | POA: Diagnosis not present

## 2019-06-11 DIAGNOSIS — J9620 Acute and chronic respiratory failure, unspecified whether with hypoxia or hypercapnia: Secondary | ICD-10-CM | POA: Diagnosis not present

## 2019-06-11 DIAGNOSIS — J189 Pneumonia, unspecified organism: Secondary | ICD-10-CM | POA: Diagnosis not present

## 2019-06-11 DIAGNOSIS — J9621 Acute and chronic respiratory failure with hypoxia: Secondary | ICD-10-CM | POA: Diagnosis not present

## 2019-06-11 DIAGNOSIS — I4891 Unspecified atrial fibrillation: Secondary | ICD-10-CM | POA: Diagnosis not present

## 2019-06-12 DIAGNOSIS — J90 Pleural effusion, not elsewhere classified: Secondary | ICD-10-CM | POA: Diagnosis not present

## 2019-06-12 DIAGNOSIS — J690 Pneumonitis due to inhalation of food and vomit: Secondary | ICD-10-CM

## 2019-06-12 DIAGNOSIS — E1122 Type 2 diabetes mellitus with diabetic chronic kidney disease: Secondary | ICD-10-CM | POA: Diagnosis not present

## 2019-06-12 DIAGNOSIS — I480 Paroxysmal atrial fibrillation: Secondary | ICD-10-CM

## 2019-06-12 DIAGNOSIS — I69354 Hemiplegia and hemiparesis following cerebral infarction affecting left non-dominant side: Secondary | ICD-10-CM | POA: Diagnosis not present

## 2019-06-12 DIAGNOSIS — Z9989 Dependence on other enabling machines and devices: Secondary | ICD-10-CM | POA: Diagnosis not present

## 2019-06-12 DIAGNOSIS — J189 Pneumonia, unspecified organism: Secondary | ICD-10-CM

## 2019-06-12 DIAGNOSIS — J9621 Acute and chronic respiratory failure with hypoxia: Secondary | ICD-10-CM

## 2019-06-12 DIAGNOSIS — Z9911 Dependence on respirator [ventilator] status: Secondary | ICD-10-CM | POA: Diagnosis not present

## 2019-06-12 DIAGNOSIS — N182 Chronic kidney disease, stage 2 (mild): Secondary | ICD-10-CM | POA: Diagnosis not present

## 2019-06-13 DIAGNOSIS — J90 Pleural effusion, not elsewhere classified: Secondary | ICD-10-CM | POA: Diagnosis not present

## 2019-06-13 DIAGNOSIS — M6281 Muscle weakness (generalized): Secondary | ICD-10-CM | POA: Diagnosis not present

## 2019-06-13 DIAGNOSIS — I10 Essential (primary) hypertension: Secondary | ICD-10-CM | POA: Diagnosis not present

## 2019-06-13 DIAGNOSIS — R1312 Dysphagia, oropharyngeal phase: Secondary | ICD-10-CM | POA: Diagnosis not present

## 2019-06-13 DIAGNOSIS — E119 Type 2 diabetes mellitus without complications: Secondary | ICD-10-CM | POA: Diagnosis not present

## 2019-06-13 DIAGNOSIS — J9690 Respiratory failure, unspecified, unspecified whether with hypoxia or hypercapnia: Secondary | ICD-10-CM | POA: Diagnosis not present

## 2019-06-13 DIAGNOSIS — Z9989 Dependence on other enabling machines and devices: Secondary | ICD-10-CM | POA: Diagnosis not present

## 2019-06-13 DIAGNOSIS — R5381 Other malaise: Secondary | ICD-10-CM | POA: Diagnosis not present

## 2019-06-13 DIAGNOSIS — E039 Hypothyroidism, unspecified: Secondary | ICD-10-CM | POA: Diagnosis not present

## 2019-06-13 DIAGNOSIS — J910 Malignant pleural effusion: Secondary | ICD-10-CM | POA: Diagnosis not present

## 2019-06-13 DIAGNOSIS — I5043 Acute on chronic combined systolic (congestive) and diastolic (congestive) heart failure: Secondary | ICD-10-CM | POA: Diagnosis not present

## 2019-06-13 DIAGNOSIS — I639 Cerebral infarction, unspecified: Secondary | ICD-10-CM | POA: Diagnosis not present

## 2019-06-13 DIAGNOSIS — J9611 Chronic respiratory failure with hypoxia: Secondary | ICD-10-CM | POA: Diagnosis not present

## 2019-06-13 DIAGNOSIS — J9621 Acute and chronic respiratory failure with hypoxia: Secondary | ICD-10-CM | POA: Diagnosis not present

## 2019-06-13 DIAGNOSIS — N189 Chronic kidney disease, unspecified: Secondary | ICD-10-CM | POA: Diagnosis not present

## 2019-06-13 DIAGNOSIS — I6789 Other cerebrovascular disease: Secondary | ICD-10-CM | POA: Diagnosis not present

## 2019-06-13 DIAGNOSIS — Z9911 Dependence on respirator [ventilator] status: Secondary | ICD-10-CM | POA: Diagnosis not present

## 2019-06-13 DIAGNOSIS — I69398 Other sequelae of cerebral infarction: Secondary | ICD-10-CM | POA: Diagnosis not present

## 2019-06-13 DIAGNOSIS — R2689 Other abnormalities of gait and mobility: Secondary | ICD-10-CM | POA: Diagnosis not present

## 2019-06-14 DIAGNOSIS — D631 Anemia in chronic kidney disease: Secondary | ICD-10-CM | POA: Diagnosis not present

## 2019-06-14 DIAGNOSIS — Z9989 Dependence on other enabling machines and devices: Secondary | ICD-10-CM | POA: Diagnosis not present

## 2019-06-14 DIAGNOSIS — E1165 Type 2 diabetes mellitus with hyperglycemia: Secondary | ICD-10-CM | POA: Diagnosis not present

## 2019-06-14 DIAGNOSIS — I129 Hypertensive chronic kidney disease with stage 1 through stage 4 chronic kidney disease, or unspecified chronic kidney disease: Secondary | ICD-10-CM | POA: Diagnosis not present

## 2019-06-14 DIAGNOSIS — I480 Paroxysmal atrial fibrillation: Secondary | ICD-10-CM | POA: Diagnosis not present

## 2019-06-14 DIAGNOSIS — R7989 Other specified abnormal findings of blood chemistry: Secondary | ICD-10-CM | POA: Diagnosis not present

## 2019-06-14 DIAGNOSIS — E1122 Type 2 diabetes mellitus with diabetic chronic kidney disease: Secondary | ICD-10-CM | POA: Diagnosis not present

## 2019-06-14 DIAGNOSIS — I69354 Hemiplegia and hemiparesis following cerebral infarction affecting left non-dominant side: Secondary | ICD-10-CM | POA: Diagnosis not present

## 2019-06-14 DIAGNOSIS — M6281 Muscle weakness (generalized): Secondary | ICD-10-CM | POA: Diagnosis not present

## 2019-06-14 DIAGNOSIS — N179 Acute kidney failure, unspecified: Secondary | ICD-10-CM | POA: Diagnosis not present

## 2019-06-14 DIAGNOSIS — J90 Pleural effusion, not elsewhere classified: Secondary | ICD-10-CM | POA: Diagnosis not present

## 2019-06-14 DIAGNOSIS — N182 Chronic kidney disease, stage 2 (mild): Secondary | ICD-10-CM | POA: Diagnosis not present

## 2019-06-14 DIAGNOSIS — R2689 Other abnormalities of gait and mobility: Secondary | ICD-10-CM | POA: Diagnosis not present

## 2019-06-14 DIAGNOSIS — R5381 Other malaise: Secondary | ICD-10-CM | POA: Diagnosis not present

## 2019-06-14 DIAGNOSIS — I69398 Other sequelae of cerebral infarction: Secondary | ICD-10-CM | POA: Diagnosis not present

## 2019-06-14 DIAGNOSIS — J9621 Acute and chronic respiratory failure with hypoxia: Secondary | ICD-10-CM | POA: Diagnosis not present

## 2019-06-14 DIAGNOSIS — E785 Hyperlipidemia, unspecified: Secondary | ICD-10-CM | POA: Diagnosis not present

## 2019-06-14 DIAGNOSIS — Z9911 Dependence on respirator [ventilator] status: Secondary | ICD-10-CM | POA: Diagnosis not present

## 2019-06-15 DIAGNOSIS — N179 Acute kidney failure, unspecified: Secondary | ICD-10-CM | POA: Diagnosis not present

## 2019-06-15 DIAGNOSIS — I639 Cerebral infarction, unspecified: Secondary | ICD-10-CM | POA: Diagnosis not present

## 2019-06-15 DIAGNOSIS — I6789 Other cerebrovascular disease: Secondary | ICD-10-CM | POA: Diagnosis not present

## 2019-06-15 DIAGNOSIS — E869 Volume depletion, unspecified: Secondary | ICD-10-CM | POA: Diagnosis not present

## 2019-06-15 DIAGNOSIS — J9621 Acute and chronic respiratory failure with hypoxia: Secondary | ICD-10-CM | POA: Diagnosis not present

## 2019-06-15 DIAGNOSIS — R7989 Other specified abnormal findings of blood chemistry: Secondary | ICD-10-CM | POA: Diagnosis not present

## 2019-06-15 DIAGNOSIS — J9611 Chronic respiratory failure with hypoxia: Secondary | ICD-10-CM | POA: Diagnosis not present

## 2019-06-15 DIAGNOSIS — I5033 Acute on chronic diastolic (congestive) heart failure: Secondary | ICD-10-CM | POA: Diagnosis not present

## 2019-06-15 DIAGNOSIS — J90 Pleural effusion, not elsewhere classified: Secondary | ICD-10-CM | POA: Diagnosis not present

## 2019-06-15 DIAGNOSIS — E785 Hyperlipidemia, unspecified: Secondary | ICD-10-CM | POA: Diagnosis not present

## 2019-06-15 DIAGNOSIS — D631 Anemia in chronic kidney disease: Secondary | ICD-10-CM | POA: Diagnosis not present

## 2019-06-15 DIAGNOSIS — E039 Hypothyroidism, unspecified: Secondary | ICD-10-CM | POA: Diagnosis not present

## 2019-06-15 DIAGNOSIS — Z9911 Dependence on respirator [ventilator] status: Secondary | ICD-10-CM | POA: Diagnosis not present

## 2019-06-15 DIAGNOSIS — Z9989 Dependence on other enabling machines and devices: Secondary | ICD-10-CM | POA: Diagnosis not present

## 2019-06-15 DIAGNOSIS — I129 Hypertensive chronic kidney disease with stage 1 through stage 4 chronic kidney disease, or unspecified chronic kidney disease: Secondary | ICD-10-CM | POA: Diagnosis not present

## 2019-06-15 DIAGNOSIS — E1165 Type 2 diabetes mellitus with hyperglycemia: Secondary | ICD-10-CM | POA: Diagnosis not present

## 2019-06-15 DIAGNOSIS — E1122 Type 2 diabetes mellitus with diabetic chronic kidney disease: Secondary | ICD-10-CM | POA: Diagnosis not present

## 2019-06-15 DIAGNOSIS — N182 Chronic kidney disease, stage 2 (mild): Secondary | ICD-10-CM | POA: Diagnosis not present

## 2019-06-16 DIAGNOSIS — Z9911 Dependence on respirator [ventilator] status: Secondary | ICD-10-CM | POA: Diagnosis not present

## 2019-06-16 DIAGNOSIS — R7989 Other specified abnormal findings of blood chemistry: Secondary | ICD-10-CM | POA: Diagnosis not present

## 2019-06-16 DIAGNOSIS — N182 Chronic kidney disease, stage 2 (mild): Secondary | ICD-10-CM | POA: Diagnosis not present

## 2019-06-16 DIAGNOSIS — E1122 Type 2 diabetes mellitus with diabetic chronic kidney disease: Secondary | ICD-10-CM | POA: Diagnosis not present

## 2019-06-16 DIAGNOSIS — N179 Acute kidney failure, unspecified: Secondary | ICD-10-CM | POA: Diagnosis not present

## 2019-06-16 DIAGNOSIS — R1312 Dysphagia, oropharyngeal phase: Secondary | ICD-10-CM | POA: Diagnosis not present

## 2019-06-16 DIAGNOSIS — J9621 Acute and chronic respiratory failure with hypoxia: Secondary | ICD-10-CM | POA: Diagnosis not present

## 2019-06-16 DIAGNOSIS — I129 Hypertensive chronic kidney disease with stage 1 through stage 4 chronic kidney disease, or unspecified chronic kidney disease: Secondary | ICD-10-CM | POA: Diagnosis not present

## 2019-06-16 DIAGNOSIS — E1165 Type 2 diabetes mellitus with hyperglycemia: Secondary | ICD-10-CM | POA: Diagnosis not present

## 2019-06-16 DIAGNOSIS — Z9989 Dependence on other enabling machines and devices: Secondary | ICD-10-CM | POA: Diagnosis not present

## 2019-06-16 DIAGNOSIS — J90 Pleural effusion, not elsewhere classified: Secondary | ICD-10-CM | POA: Diagnosis not present

## 2019-06-16 DIAGNOSIS — E785 Hyperlipidemia, unspecified: Secondary | ICD-10-CM | POA: Diagnosis not present

## 2019-06-16 DIAGNOSIS — I5043 Acute on chronic combined systolic (congestive) and diastolic (congestive) heart failure: Secondary | ICD-10-CM | POA: Diagnosis not present

## 2019-06-16 DIAGNOSIS — D631 Anemia in chronic kidney disease: Secondary | ICD-10-CM | POA: Diagnosis not present

## 2019-06-16 DIAGNOSIS — E039 Hypothyroidism, unspecified: Secondary | ICD-10-CM | POA: Diagnosis not present

## 2019-06-16 DIAGNOSIS — I639 Cerebral infarction, unspecified: Secondary | ICD-10-CM | POA: Diagnosis not present

## 2019-06-17 DIAGNOSIS — Z9989 Dependence on other enabling machines and devices: Secondary | ICD-10-CM | POA: Diagnosis not present

## 2019-06-17 DIAGNOSIS — I639 Cerebral infarction, unspecified: Secondary | ICD-10-CM | POA: Diagnosis not present

## 2019-06-17 DIAGNOSIS — R1312 Dysphagia, oropharyngeal phase: Secondary | ICD-10-CM | POA: Diagnosis not present

## 2019-06-17 DIAGNOSIS — K3184 Gastroparesis: Secondary | ICD-10-CM | POA: Diagnosis not present

## 2019-06-17 DIAGNOSIS — J9621 Acute and chronic respiratory failure with hypoxia: Secondary | ICD-10-CM | POA: Diagnosis not present

## 2019-06-17 DIAGNOSIS — J90 Pleural effusion, not elsewhere classified: Secondary | ICD-10-CM | POA: Diagnosis not present

## 2019-06-17 DIAGNOSIS — Z9911 Dependence on respirator [ventilator] status: Secondary | ICD-10-CM | POA: Diagnosis not present

## 2019-06-17 DIAGNOSIS — I5043 Acute on chronic combined systolic (congestive) and diastolic (congestive) heart failure: Secondary | ICD-10-CM | POA: Diagnosis not present

## 2019-06-18 DIAGNOSIS — Z9911 Dependence on respirator [ventilator] status: Secondary | ICD-10-CM | POA: Diagnosis not present

## 2019-06-18 DIAGNOSIS — K3184 Gastroparesis: Secondary | ICD-10-CM | POA: Diagnosis not present

## 2019-06-18 DIAGNOSIS — J9621 Acute and chronic respiratory failure with hypoxia: Secondary | ICD-10-CM | POA: Diagnosis not present

## 2019-06-18 DIAGNOSIS — I639 Cerebral infarction, unspecified: Secondary | ICD-10-CM | POA: Diagnosis not present

## 2019-06-18 DIAGNOSIS — I5043 Acute on chronic combined systolic (congestive) and diastolic (congestive) heart failure: Secondary | ICD-10-CM | POA: Diagnosis not present

## 2019-06-18 DIAGNOSIS — Z9989 Dependence on other enabling machines and devices: Secondary | ICD-10-CM | POA: Diagnosis not present

## 2019-06-18 DIAGNOSIS — R1312 Dysphagia, oropharyngeal phase: Secondary | ICD-10-CM | POA: Diagnosis not present

## 2019-06-18 DIAGNOSIS — J90 Pleural effusion, not elsewhere classified: Secondary | ICD-10-CM | POA: Diagnosis not present

## 2019-06-19 DIAGNOSIS — I639 Cerebral infarction, unspecified: Secondary | ICD-10-CM | POA: Diagnosis not present

## 2019-06-19 DIAGNOSIS — N182 Chronic kidney disease, stage 2 (mild): Secondary | ICD-10-CM | POA: Diagnosis not present

## 2019-06-19 DIAGNOSIS — I480 Paroxysmal atrial fibrillation: Secondary | ICD-10-CM | POA: Diagnosis not present

## 2019-06-19 DIAGNOSIS — J690 Pneumonitis due to inhalation of food and vomit: Secondary | ICD-10-CM

## 2019-06-19 DIAGNOSIS — N179 Acute kidney failure, unspecified: Secondary | ICD-10-CM | POA: Diagnosis not present

## 2019-06-19 DIAGNOSIS — K3184 Gastroparesis: Secondary | ICD-10-CM | POA: Diagnosis not present

## 2019-06-19 DIAGNOSIS — M6281 Muscle weakness (generalized): Secondary | ICD-10-CM | POA: Diagnosis not present

## 2019-06-19 DIAGNOSIS — I5043 Acute on chronic combined systolic (congestive) and diastolic (congestive) heart failure: Secondary | ICD-10-CM | POA: Diagnosis not present

## 2019-06-19 DIAGNOSIS — J9621 Acute and chronic respiratory failure with hypoxia: Secondary | ICD-10-CM | POA: Diagnosis not present

## 2019-06-19 DIAGNOSIS — Z9911 Dependence on respirator [ventilator] status: Secondary | ICD-10-CM | POA: Diagnosis not present

## 2019-06-19 DIAGNOSIS — R5381 Other malaise: Secondary | ICD-10-CM | POA: Diagnosis not present

## 2019-06-19 DIAGNOSIS — J90 Pleural effusion, not elsewhere classified: Secondary | ICD-10-CM | POA: Diagnosis not present

## 2019-06-19 DIAGNOSIS — J189 Pneumonia, unspecified organism: Secondary | ICD-10-CM | POA: Diagnosis not present

## 2019-06-19 DIAGNOSIS — I6789 Other cerebrovascular disease: Secondary | ICD-10-CM | POA: Diagnosis not present

## 2019-06-19 DIAGNOSIS — J9611 Chronic respiratory failure with hypoxia: Secondary | ICD-10-CM | POA: Diagnosis not present

## 2019-06-19 DIAGNOSIS — D631 Anemia in chronic kidney disease: Secondary | ICD-10-CM | POA: Diagnosis not present

## 2019-06-19 DIAGNOSIS — E1165 Type 2 diabetes mellitus with hyperglycemia: Secondary | ICD-10-CM | POA: Diagnosis not present

## 2019-06-19 DIAGNOSIS — R7989 Other specified abnormal findings of blood chemistry: Secondary | ICD-10-CM | POA: Diagnosis not present

## 2019-06-19 DIAGNOSIS — R1312 Dysphagia, oropharyngeal phase: Secondary | ICD-10-CM | POA: Diagnosis not present

## 2019-06-19 DIAGNOSIS — E1122 Type 2 diabetes mellitus with diabetic chronic kidney disease: Secondary | ICD-10-CM | POA: Diagnosis not present

## 2019-06-19 DIAGNOSIS — I129 Hypertensive chronic kidney disease with stage 1 through stage 4 chronic kidney disease, or unspecified chronic kidney disease: Secondary | ICD-10-CM | POA: Diagnosis not present

## 2019-06-19 DIAGNOSIS — I69398 Other sequelae of cerebral infarction: Secondary | ICD-10-CM | POA: Diagnosis not present

## 2019-06-19 DIAGNOSIS — R2689 Other abnormalities of gait and mobility: Secondary | ICD-10-CM | POA: Diagnosis not present

## 2019-06-19 DIAGNOSIS — E785 Hyperlipidemia, unspecified: Secondary | ICD-10-CM | POA: Diagnosis not present

## 2019-06-19 DIAGNOSIS — I69354 Hemiplegia and hemiparesis following cerebral infarction affecting left non-dominant side: Secondary | ICD-10-CM | POA: Diagnosis not present

## 2019-06-20 DIAGNOSIS — I129 Hypertensive chronic kidney disease with stage 1 through stage 4 chronic kidney disease, or unspecified chronic kidney disease: Secondary | ICD-10-CM | POA: Diagnosis not present

## 2019-06-20 DIAGNOSIS — I480 Paroxysmal atrial fibrillation: Secondary | ICD-10-CM | POA: Diagnosis not present

## 2019-06-20 DIAGNOSIS — J189 Pneumonia, unspecified organism: Secondary | ICD-10-CM | POA: Diagnosis not present

## 2019-06-20 DIAGNOSIS — I5043 Acute on chronic combined systolic (congestive) and diastolic (congestive) heart failure: Secondary | ICD-10-CM | POA: Diagnosis not present

## 2019-06-20 DIAGNOSIS — E1165 Type 2 diabetes mellitus with hyperglycemia: Secondary | ICD-10-CM | POA: Diagnosis not present

## 2019-06-20 DIAGNOSIS — E1122 Type 2 diabetes mellitus with diabetic chronic kidney disease: Secondary | ICD-10-CM | POA: Diagnosis not present

## 2019-06-20 DIAGNOSIS — J9621 Acute and chronic respiratory failure with hypoxia: Secondary | ICD-10-CM | POA: Diagnosis not present

## 2019-06-20 DIAGNOSIS — N179 Acute kidney failure, unspecified: Secondary | ICD-10-CM | POA: Diagnosis not present

## 2019-06-20 DIAGNOSIS — K3184 Gastroparesis: Secondary | ICD-10-CM | POA: Diagnosis not present

## 2019-06-20 DIAGNOSIS — D631 Anemia in chronic kidney disease: Secondary | ICD-10-CM | POA: Diagnosis not present

## 2019-06-20 DIAGNOSIS — I639 Cerebral infarction, unspecified: Secondary | ICD-10-CM | POA: Diagnosis not present

## 2019-06-20 DIAGNOSIS — J690 Pneumonitis due to inhalation of food and vomit: Secondary | ICD-10-CM

## 2019-06-20 DIAGNOSIS — E785 Hyperlipidemia, unspecified: Secondary | ICD-10-CM | POA: Diagnosis not present

## 2019-06-20 DIAGNOSIS — R1312 Dysphagia, oropharyngeal phase: Secondary | ICD-10-CM | POA: Diagnosis not present

## 2019-06-20 DIAGNOSIS — R7989 Other specified abnormal findings of blood chemistry: Secondary | ICD-10-CM | POA: Diagnosis not present

## 2019-06-20 DIAGNOSIS — J90 Pleural effusion, not elsewhere classified: Secondary | ICD-10-CM | POA: Diagnosis not present

## 2019-06-20 DIAGNOSIS — N182 Chronic kidney disease, stage 2 (mild): Secondary | ICD-10-CM | POA: Diagnosis not present

## 2019-06-21 DIAGNOSIS — M6281 Muscle weakness (generalized): Secondary | ICD-10-CM | POA: Diagnosis not present

## 2019-06-21 DIAGNOSIS — I69398 Other sequelae of cerebral infarction: Secondary | ICD-10-CM | POA: Diagnosis not present

## 2019-06-21 DIAGNOSIS — D631 Anemia in chronic kidney disease: Secondary | ICD-10-CM | POA: Diagnosis not present

## 2019-06-21 DIAGNOSIS — N182 Chronic kidney disease, stage 2 (mild): Secondary | ICD-10-CM | POA: Diagnosis not present

## 2019-06-21 DIAGNOSIS — J189 Pneumonia, unspecified organism: Secondary | ICD-10-CM | POA: Diagnosis not present

## 2019-06-21 DIAGNOSIS — R5381 Other malaise: Secondary | ICD-10-CM | POA: Diagnosis not present

## 2019-06-21 DIAGNOSIS — I129 Hypertensive chronic kidney disease with stage 1 through stage 4 chronic kidney disease, or unspecified chronic kidney disease: Secondary | ICD-10-CM | POA: Diagnosis not present

## 2019-06-21 DIAGNOSIS — J9621 Acute and chronic respiratory failure with hypoxia: Secondary | ICD-10-CM | POA: Diagnosis not present

## 2019-06-21 DIAGNOSIS — K3184 Gastroparesis: Secondary | ICD-10-CM | POA: Diagnosis not present

## 2019-06-21 DIAGNOSIS — I69354 Hemiplegia and hemiparesis following cerebral infarction affecting left non-dominant side: Secondary | ICD-10-CM | POA: Diagnosis not present

## 2019-06-21 DIAGNOSIS — E785 Hyperlipidemia, unspecified: Secondary | ICD-10-CM | POA: Diagnosis not present

## 2019-06-21 DIAGNOSIS — J690 Pneumonitis due to inhalation of food and vomit: Secondary | ICD-10-CM

## 2019-06-21 DIAGNOSIS — N179 Acute kidney failure, unspecified: Secondary | ICD-10-CM | POA: Diagnosis not present

## 2019-06-21 DIAGNOSIS — R2689 Other abnormalities of gait and mobility: Secondary | ICD-10-CM | POA: Diagnosis not present

## 2019-06-21 DIAGNOSIS — R7989 Other specified abnormal findings of blood chemistry: Secondary | ICD-10-CM | POA: Diagnosis not present

## 2019-06-21 DIAGNOSIS — K2900 Acute gastritis without bleeding: Secondary | ICD-10-CM | POA: Diagnosis not present

## 2019-06-21 DIAGNOSIS — I480 Paroxysmal atrial fibrillation: Secondary | ICD-10-CM

## 2019-06-21 DIAGNOSIS — E1165 Type 2 diabetes mellitus with hyperglycemia: Secondary | ICD-10-CM | POA: Diagnosis not present

## 2019-06-21 DIAGNOSIS — E1122 Type 2 diabetes mellitus with diabetic chronic kidney disease: Secondary | ICD-10-CM | POA: Diagnosis not present

## 2019-06-21 DIAGNOSIS — I5043 Acute on chronic combined systolic (congestive) and diastolic (congestive) heart failure: Secondary | ICD-10-CM | POA: Diagnosis not present

## 2019-06-21 DIAGNOSIS — E039 Hypothyroidism, unspecified: Secondary | ICD-10-CM | POA: Diagnosis not present

## 2019-06-22 DIAGNOSIS — E1122 Type 2 diabetes mellitus with diabetic chronic kidney disease: Secondary | ICD-10-CM | POA: Diagnosis not present

## 2019-06-22 DIAGNOSIS — Z9911 Dependence on respirator [ventilator] status: Secondary | ICD-10-CM | POA: Diagnosis not present

## 2019-06-22 DIAGNOSIS — E785 Hyperlipidemia, unspecified: Secondary | ICD-10-CM | POA: Diagnosis not present

## 2019-06-22 DIAGNOSIS — J90 Pleural effusion, not elsewhere classified: Secondary | ICD-10-CM | POA: Diagnosis not present

## 2019-06-22 DIAGNOSIS — I480 Paroxysmal atrial fibrillation: Secondary | ICD-10-CM | POA: Diagnosis not present

## 2019-06-22 DIAGNOSIS — R7989 Other specified abnormal findings of blood chemistry: Secondary | ICD-10-CM | POA: Diagnosis not present

## 2019-06-22 DIAGNOSIS — N182 Chronic kidney disease, stage 2 (mild): Secondary | ICD-10-CM | POA: Diagnosis not present

## 2019-06-22 DIAGNOSIS — J9611 Chronic respiratory failure with hypoxia: Secondary | ICD-10-CM | POA: Diagnosis not present

## 2019-06-22 DIAGNOSIS — I509 Heart failure, unspecified: Secondary | ICD-10-CM | POA: Diagnosis not present

## 2019-06-22 DIAGNOSIS — I5043 Acute on chronic combined systolic (congestive) and diastolic (congestive) heart failure: Secondary | ICD-10-CM | POA: Diagnosis not present

## 2019-06-22 DIAGNOSIS — I129 Hypertensive chronic kidney disease with stage 1 through stage 4 chronic kidney disease, or unspecified chronic kidney disease: Secondary | ICD-10-CM | POA: Diagnosis not present

## 2019-06-22 DIAGNOSIS — J9621 Acute and chronic respiratory failure with hypoxia: Secondary | ICD-10-CM

## 2019-06-22 DIAGNOSIS — J189 Pneumonia, unspecified organism: Secondary | ICD-10-CM

## 2019-06-22 DIAGNOSIS — D631 Anemia in chronic kidney disease: Secondary | ICD-10-CM | POA: Diagnosis not present

## 2019-06-22 DIAGNOSIS — E039 Hypothyroidism, unspecified: Secondary | ICD-10-CM | POA: Diagnosis not present

## 2019-06-22 DIAGNOSIS — E1165 Type 2 diabetes mellitus with hyperglycemia: Secondary | ICD-10-CM | POA: Diagnosis not present

## 2019-06-22 DIAGNOSIS — N179 Acute kidney failure, unspecified: Secondary | ICD-10-CM | POA: Diagnosis not present

## 2019-06-22 DIAGNOSIS — R1312 Dysphagia, oropharyngeal phase: Secondary | ICD-10-CM | POA: Diagnosis not present

## 2019-06-22 DIAGNOSIS — I639 Cerebral infarction, unspecified: Secondary | ICD-10-CM | POA: Diagnosis not present

## 2019-06-22 DIAGNOSIS — J690 Pneumonitis due to inhalation of food and vomit: Secondary | ICD-10-CM | POA: Diagnosis not present

## 2019-06-22 DIAGNOSIS — I6789 Other cerebrovascular disease: Secondary | ICD-10-CM | POA: Diagnosis not present

## 2019-06-23 DIAGNOSIS — I480 Paroxysmal atrial fibrillation: Secondary | ICD-10-CM | POA: Diagnosis not present

## 2019-06-23 DIAGNOSIS — N182 Chronic kidney disease, stage 2 (mild): Secondary | ICD-10-CM | POA: Diagnosis not present

## 2019-06-23 DIAGNOSIS — J189 Pneumonia, unspecified organism: Secondary | ICD-10-CM | POA: Diagnosis not present

## 2019-06-23 DIAGNOSIS — J9621 Acute and chronic respiratory failure with hypoxia: Secondary | ICD-10-CM | POA: Diagnosis not present

## 2019-06-23 DIAGNOSIS — I5043 Acute on chronic combined systolic (congestive) and diastolic (congestive) heart failure: Secondary | ICD-10-CM | POA: Diagnosis not present

## 2019-06-23 DIAGNOSIS — J690 Pneumonitis due to inhalation of food and vomit: Secondary | ICD-10-CM | POA: Diagnosis not present

## 2019-06-23 DIAGNOSIS — I69354 Hemiplegia and hemiparesis following cerebral infarction affecting left non-dominant side: Secondary | ICD-10-CM | POA: Diagnosis not present

## 2019-06-23 DIAGNOSIS — K2900 Acute gastritis without bleeding: Secondary | ICD-10-CM | POA: Diagnosis not present

## 2019-06-23 DIAGNOSIS — E1122 Type 2 diabetes mellitus with diabetic chronic kidney disease: Secondary | ICD-10-CM | POA: Diagnosis not present

## 2019-06-23 DIAGNOSIS — K3184 Gastroparesis: Secondary | ICD-10-CM | POA: Diagnosis not present

## 2019-06-23 DIAGNOSIS — E039 Hypothyroidism, unspecified: Secondary | ICD-10-CM | POA: Diagnosis not present

## 2019-06-24 DIAGNOSIS — J690 Pneumonitis due to inhalation of food and vomit: Secondary | ICD-10-CM | POA: Diagnosis not present

## 2019-06-24 DIAGNOSIS — I480 Paroxysmal atrial fibrillation: Secondary | ICD-10-CM | POA: Diagnosis not present

## 2019-06-24 DIAGNOSIS — E1122 Type 2 diabetes mellitus with diabetic chronic kidney disease: Secondary | ICD-10-CM | POA: Diagnosis not present

## 2019-06-24 DIAGNOSIS — I69354 Hemiplegia and hemiparesis following cerebral infarction affecting left non-dominant side: Secondary | ICD-10-CM | POA: Diagnosis not present

## 2019-06-24 DIAGNOSIS — N182 Chronic kidney disease, stage 2 (mild): Secondary | ICD-10-CM | POA: Diagnosis not present

## 2019-06-24 DIAGNOSIS — J189 Pneumonia, unspecified organism: Secondary | ICD-10-CM | POA: Diagnosis not present

## 2019-06-24 DIAGNOSIS — J9621 Acute and chronic respiratory failure with hypoxia: Secondary | ICD-10-CM | POA: Diagnosis not present

## 2019-06-25 DIAGNOSIS — N182 Chronic kidney disease, stage 2 (mild): Secondary | ICD-10-CM | POA: Diagnosis not present

## 2019-06-25 DIAGNOSIS — J690 Pneumonitis due to inhalation of food and vomit: Secondary | ICD-10-CM | POA: Diagnosis not present

## 2019-06-25 DIAGNOSIS — J9621 Acute and chronic respiratory failure with hypoxia: Secondary | ICD-10-CM

## 2019-06-25 DIAGNOSIS — I480 Paroxysmal atrial fibrillation: Secondary | ICD-10-CM | POA: Diagnosis not present

## 2019-06-25 DIAGNOSIS — E1122 Type 2 diabetes mellitus with diabetic chronic kidney disease: Secondary | ICD-10-CM | POA: Diagnosis not present

## 2019-06-25 DIAGNOSIS — J189 Pneumonia, unspecified organism: Secondary | ICD-10-CM | POA: Diagnosis not present

## 2019-06-25 DIAGNOSIS — I69354 Hemiplegia and hemiparesis following cerebral infarction affecting left non-dominant side: Secondary | ICD-10-CM | POA: Diagnosis not present

## 2019-06-26 DIAGNOSIS — I69354 Hemiplegia and hemiparesis following cerebral infarction affecting left non-dominant side: Secondary | ICD-10-CM | POA: Diagnosis not present

## 2019-06-26 DIAGNOSIS — I5043 Acute on chronic combined systolic (congestive) and diastolic (congestive) heart failure: Secondary | ICD-10-CM | POA: Diagnosis not present

## 2019-06-26 DIAGNOSIS — K3184 Gastroparesis: Secondary | ICD-10-CM | POA: Diagnosis not present

## 2019-06-26 DIAGNOSIS — Z9911 Dependence on respirator [ventilator] status: Secondary | ICD-10-CM | POA: Diagnosis not present

## 2019-06-26 DIAGNOSIS — E039 Hypothyroidism, unspecified: Secondary | ICD-10-CM | POA: Diagnosis not present

## 2019-06-26 DIAGNOSIS — K2900 Acute gastritis without bleeding: Secondary | ICD-10-CM | POA: Diagnosis not present

## 2019-06-26 DIAGNOSIS — J9621 Acute and chronic respiratory failure with hypoxia: Secondary | ICD-10-CM | POA: Diagnosis not present

## 2019-06-26 DIAGNOSIS — E1122 Type 2 diabetes mellitus with diabetic chronic kidney disease: Secondary | ICD-10-CM | POA: Diagnosis not present

## 2019-06-26 DIAGNOSIS — I480 Paroxysmal atrial fibrillation: Secondary | ICD-10-CM | POA: Diagnosis not present

## 2019-06-26 DIAGNOSIS — J90 Pleural effusion, not elsewhere classified: Secondary | ICD-10-CM | POA: Diagnosis not present

## 2019-06-26 DIAGNOSIS — N182 Chronic kidney disease, stage 2 (mild): Secondary | ICD-10-CM | POA: Diagnosis not present

## 2019-06-27 DIAGNOSIS — J90 Pleural effusion, not elsewhere classified: Secondary | ICD-10-CM | POA: Diagnosis not present

## 2019-06-27 DIAGNOSIS — R2689 Other abnormalities of gait and mobility: Secondary | ICD-10-CM | POA: Diagnosis not present

## 2019-06-27 DIAGNOSIS — K3184 Gastroparesis: Secondary | ICD-10-CM | POA: Diagnosis not present

## 2019-06-27 DIAGNOSIS — I5043 Acute on chronic combined systolic (congestive) and diastolic (congestive) heart failure: Secondary | ICD-10-CM | POA: Diagnosis not present

## 2019-06-27 DIAGNOSIS — J9691 Respiratory failure, unspecified with hypoxia: Secondary | ICD-10-CM | POA: Diagnosis not present

## 2019-06-27 DIAGNOSIS — I639 Cerebral infarction, unspecified: Secondary | ICD-10-CM | POA: Diagnosis not present

## 2019-06-27 DIAGNOSIS — M6281 Muscle weakness (generalized): Secondary | ICD-10-CM | POA: Diagnosis not present

## 2019-06-27 DIAGNOSIS — J9621 Acute and chronic respiratory failure with hypoxia: Secondary | ICD-10-CM | POA: Diagnosis not present

## 2019-06-27 DIAGNOSIS — E119 Type 2 diabetes mellitus without complications: Secondary | ICD-10-CM | POA: Diagnosis not present

## 2019-06-27 DIAGNOSIS — E039 Hypothyroidism, unspecified: Secondary | ICD-10-CM | POA: Diagnosis not present

## 2019-06-27 DIAGNOSIS — Z9911 Dependence on respirator [ventilator] status: Secondary | ICD-10-CM | POA: Diagnosis not present

## 2019-06-27 DIAGNOSIS — R5381 Other malaise: Secondary | ICD-10-CM | POA: Diagnosis not present

## 2019-06-27 DIAGNOSIS — I69398 Other sequelae of cerebral infarction: Secondary | ICD-10-CM | POA: Diagnosis not present

## 2019-06-27 DIAGNOSIS — K2900 Acute gastritis without bleeding: Secondary | ICD-10-CM | POA: Diagnosis not present

## 2019-06-28 DIAGNOSIS — J90 Pleural effusion, not elsewhere classified: Secondary | ICD-10-CM | POA: Diagnosis not present

## 2019-06-28 DIAGNOSIS — J9691 Respiratory failure, unspecified with hypoxia: Secondary | ICD-10-CM | POA: Diagnosis not present

## 2019-06-28 DIAGNOSIS — I5043 Acute on chronic combined systolic (congestive) and diastolic (congestive) heart failure: Secondary | ICD-10-CM | POA: Diagnosis not present

## 2019-06-28 DIAGNOSIS — J9621 Acute and chronic respiratory failure with hypoxia: Secondary | ICD-10-CM | POA: Diagnosis not present

## 2019-06-28 DIAGNOSIS — E1122 Type 2 diabetes mellitus with diabetic chronic kidney disease: Secondary | ICD-10-CM | POA: Diagnosis not present

## 2019-06-28 DIAGNOSIS — I639 Cerebral infarction, unspecified: Secondary | ICD-10-CM | POA: Diagnosis not present

## 2019-06-28 DIAGNOSIS — I69354 Hemiplegia and hemiparesis following cerebral infarction affecting left non-dominant side: Secondary | ICD-10-CM | POA: Diagnosis not present

## 2019-06-28 DIAGNOSIS — I480 Paroxysmal atrial fibrillation: Secondary | ICD-10-CM | POA: Diagnosis not present

## 2019-06-28 DIAGNOSIS — Z9911 Dependence on respirator [ventilator] status: Secondary | ICD-10-CM | POA: Diagnosis not present

## 2019-06-28 DIAGNOSIS — E119 Type 2 diabetes mellitus without complications: Secondary | ICD-10-CM | POA: Diagnosis not present

## 2019-06-28 DIAGNOSIS — K2900 Acute gastritis without bleeding: Secondary | ICD-10-CM | POA: Diagnosis not present

## 2019-06-28 DIAGNOSIS — E039 Hypothyroidism, unspecified: Secondary | ICD-10-CM | POA: Diagnosis not present

## 2019-06-28 DIAGNOSIS — N182 Chronic kidney disease, stage 2 (mild): Secondary | ICD-10-CM | POA: Diagnosis not present

## 2019-06-28 DIAGNOSIS — K3184 Gastroparesis: Secondary | ICD-10-CM | POA: Diagnosis not present

## 2019-06-29 DIAGNOSIS — I69398 Other sequelae of cerebral infarction: Secondary | ICD-10-CM | POA: Diagnosis not present

## 2019-06-29 DIAGNOSIS — J90 Pleural effusion, not elsewhere classified: Secondary | ICD-10-CM | POA: Diagnosis not present

## 2019-06-29 DIAGNOSIS — F419 Anxiety disorder, unspecified: Secondary | ICD-10-CM | POA: Diagnosis not present

## 2019-06-29 DIAGNOSIS — I5043 Acute on chronic combined systolic (congestive) and diastolic (congestive) heart failure: Secondary | ICD-10-CM | POA: Diagnosis not present

## 2019-06-29 DIAGNOSIS — R2689 Other abnormalities of gait and mobility: Secondary | ICD-10-CM | POA: Diagnosis not present

## 2019-06-29 DIAGNOSIS — R1312 Dysphagia, oropharyngeal phase: Secondary | ICD-10-CM | POA: Diagnosis not present

## 2019-06-29 DIAGNOSIS — I639 Cerebral infarction, unspecified: Secondary | ICD-10-CM | POA: Diagnosis not present

## 2019-06-29 DIAGNOSIS — R69 Illness, unspecified: Secondary | ICD-10-CM | POA: Diagnosis not present

## 2019-06-29 DIAGNOSIS — E039 Hypothyroidism, unspecified: Secondary | ICD-10-CM | POA: Diagnosis not present

## 2019-06-29 DIAGNOSIS — R5381 Other malaise: Secondary | ICD-10-CM | POA: Diagnosis not present

## 2019-06-29 DIAGNOSIS — M6281 Muscle weakness (generalized): Secondary | ICD-10-CM | POA: Diagnosis not present

## 2019-06-29 DIAGNOSIS — Z9911 Dependence on respirator [ventilator] status: Secondary | ICD-10-CM | POA: Diagnosis not present

## 2019-06-29 DIAGNOSIS — J9621 Acute and chronic respiratory failure with hypoxia: Secondary | ICD-10-CM | POA: Diagnosis not present

## 2019-06-30 DIAGNOSIS — J90 Pleural effusion, not elsewhere classified: Secondary | ICD-10-CM | POA: Diagnosis not present

## 2019-06-30 DIAGNOSIS — I639 Cerebral infarction, unspecified: Secondary | ICD-10-CM | POA: Diagnosis not present

## 2019-06-30 DIAGNOSIS — R1312 Dysphagia, oropharyngeal phase: Secondary | ICD-10-CM | POA: Diagnosis not present

## 2019-06-30 DIAGNOSIS — J9621 Acute and chronic respiratory failure with hypoxia: Secondary | ICD-10-CM | POA: Diagnosis not present

## 2019-06-30 DIAGNOSIS — I5043 Acute on chronic combined systolic (congestive) and diastolic (congestive) heart failure: Secondary | ICD-10-CM | POA: Diagnosis not present

## 2019-06-30 DIAGNOSIS — K2900 Acute gastritis without bleeding: Secondary | ICD-10-CM | POA: Diagnosis not present

## 2019-06-30 DIAGNOSIS — Z9911 Dependence on respirator [ventilator] status: Secondary | ICD-10-CM | POA: Diagnosis not present

## 2019-07-01 DIAGNOSIS — J9621 Acute and chronic respiratory failure with hypoxia: Secondary | ICD-10-CM | POA: Diagnosis not present

## 2019-07-01 DIAGNOSIS — Z9911 Dependence on respirator [ventilator] status: Secondary | ICD-10-CM | POA: Diagnosis not present

## 2019-07-01 DIAGNOSIS — J90 Pleural effusion, not elsewhere classified: Secondary | ICD-10-CM | POA: Diagnosis not present

## 2019-08-01 DEATH — deceased

## 2019-08-10 ENCOUNTER — Ambulatory Visit: Payer: Medicare HMO

## 2019-08-10 ENCOUNTER — Telehealth: Payer: Self-pay

## 2019-08-10 ENCOUNTER — Ambulatory Visit: Payer: Medicare HMO | Admitting: Internal Medicine

## 2019-08-10 DIAGNOSIS — Z0289 Encounter for other administrative examinations: Secondary | ICD-10-CM

## 2019-08-10 NOTE — Telephone Encounter (Signed)
No answer, no voicemail when calling for scheduled annual wellness visit. Please reschedule as appropriate.
